# Patient Record
Sex: Male | Born: 1965 | Race: Black or African American | Hispanic: No | Marital: Married | Smoking: Former smoker
Health system: Southern US, Community
[De-identification: ages and names within clinical notes are randomized; demographics above are authoritative.]

## PROBLEM LIST (undated history)

## (undated) DIAGNOSIS — K219 Gastro-esophageal reflux disease without esophagitis: Secondary | ICD-10-CM

## (undated) DIAGNOSIS — G5603 Carpal tunnel syndrome, bilateral upper limbs: Secondary | ICD-10-CM

## (undated) DIAGNOSIS — N200 Calculus of kidney: Secondary | ICD-10-CM

## (undated) DIAGNOSIS — M109 Gout, unspecified: Secondary | ICD-10-CM

## (undated) DIAGNOSIS — E559 Vitamin D deficiency, unspecified: Secondary | ICD-10-CM

## (undated) DIAGNOSIS — E119 Type 2 diabetes mellitus without complications: Secondary | ICD-10-CM

## (undated) DIAGNOSIS — E785 Hyperlipidemia, unspecified: Secondary | ICD-10-CM

## (undated) DIAGNOSIS — K760 Fatty (change of) liver, not elsewhere classified: Secondary | ICD-10-CM

## (undated) DIAGNOSIS — Z794 Long term (current) use of insulin: Secondary | ICD-10-CM

## (undated) DIAGNOSIS — I10 Essential (primary) hypertension: Secondary | ICD-10-CM

## (undated) HISTORY — DX: Fatty (change of) liver, not elsewhere classified: K76.0

## (undated) HISTORY — DX: Gastro-esophageal reflux disease without esophagitis: K21.9

## (undated) HISTORY — DX: Gout, unspecified: M10.9

## (undated) HISTORY — DX: Type 2 diabetes mellitus without complications: E11.9

## (undated) HISTORY — DX: Hyperlipidemia, unspecified: E78.5

## (undated) HISTORY — DX: Type 2 diabetes mellitus without complications: Z79.4

## (undated) HISTORY — DX: Vitamin D deficiency, unspecified: E55.9

## (undated) HISTORY — DX: Essential (primary) hypertension: I10

---

## 2008-07-05 ENCOUNTER — Encounter (INDEPENDENT_AMBULATORY_CARE_PROVIDER_SITE_OTHER): Payer: Self-pay | Admitting: Internal Medicine

## 2008-07-05 ENCOUNTER — Ambulatory Visit: Payer: Self-pay

## 2008-12-09 ENCOUNTER — Ambulatory Visit (HOSPITAL_COMMUNITY): Admission: RE | Admit: 2008-12-09 | Discharge: 2008-12-09 | Payer: Self-pay | Admitting: Internal Medicine

## 2010-10-23 ENCOUNTER — Ambulatory Visit (HOSPITAL_COMMUNITY)
Admission: RE | Admit: 2010-10-23 | Discharge: 2010-10-23 | Payer: Self-pay | Source: Home / Self Care | Attending: Internal Medicine | Admitting: Internal Medicine

## 2011-05-21 ENCOUNTER — Other Ambulatory Visit (HOSPITAL_COMMUNITY): Payer: Self-pay | Admitting: Internal Medicine

## 2011-05-21 DIAGNOSIS — N281 Cyst of kidney, acquired: Secondary | ICD-10-CM

## 2011-05-25 ENCOUNTER — Ambulatory Visit (HOSPITAL_COMMUNITY)
Admission: RE | Admit: 2011-05-25 | Discharge: 2011-05-25 | Disposition: A | Discharge status: Home / Self Care | Payer: 59 | Source: Ambulatory Visit | Attending: Internal Medicine | Admitting: Internal Medicine

## 2011-05-25 DIAGNOSIS — Q619 Cystic kidney disease, unspecified: Secondary | ICD-10-CM | POA: Insufficient documentation

## 2011-05-25 DIAGNOSIS — N200 Calculus of kidney: Secondary | ICD-10-CM | POA: Insufficient documentation

## 2011-05-25 DIAGNOSIS — N281 Cyst of kidney, acquired: Secondary | ICD-10-CM

## 2011-11-27 ENCOUNTER — Ambulatory Visit
Admission: RE | Admit: 2011-11-27 | Discharge: 2011-11-27 | Disposition: A | Discharge status: Home / Self Care | Payer: 59 | Source: Ambulatory Visit | Attending: Internal Medicine | Admitting: Internal Medicine

## 2011-11-27 ENCOUNTER — Other Ambulatory Visit: Payer: Self-pay | Admitting: Internal Medicine

## 2011-11-27 DIAGNOSIS — R1031 Right lower quadrant pain: Secondary | ICD-10-CM

## 2011-11-27 MED ORDER — IOHEXOL 300 MG/ML  SOLN
20.0000 mL | Freq: Once | INTRAMUSCULAR | Status: AC | PRN
  Administered 2011-11-27: 20 mL via ORAL

## 2011-11-27 MED ORDER — IOHEXOL 300 MG/ML  SOLN
125.0000 mL | Freq: Once | INTRAMUSCULAR | Status: AC | PRN
  Administered 2011-11-27: 125 mL via INTRAVENOUS

## 2011-11-28 ENCOUNTER — Other Ambulatory Visit: Payer: Self-pay | Admitting: Internal Medicine

## 2011-11-28 DIAGNOSIS — IMO0002 Reserved for concepts with insufficient information to code with codable children: Secondary | ICD-10-CM

## 2011-12-05 ENCOUNTER — Ambulatory Visit
Admission: RE | Admit: 2011-12-05 | Discharge: 2011-12-05 | Disposition: A | Discharge status: Home / Self Care | Payer: 59 | Source: Ambulatory Visit | Attending: Internal Medicine | Admitting: Internal Medicine

## 2011-12-05 DIAGNOSIS — IMO0002 Reserved for concepts with insufficient information to code with codable children: Secondary | ICD-10-CM

## 2011-12-05 MED ORDER — GADOBENATE DIMEGLUMINE 529 MG/ML IV SOLN
20.0000 mL | Freq: Once | INTRAVENOUS | Status: AC | PRN
  Administered 2011-12-05: 20 mL via INTRAVENOUS

## 2013-10-15 ENCOUNTER — Other Ambulatory Visit: Payer: Self-pay | Admitting: Internal Medicine

## 2013-10-22 ENCOUNTER — Encounter: Payer: Self-pay | Admitting: Physician Assistant

## 2013-10-22 DIAGNOSIS — E119 Type 2 diabetes mellitus without complications: Secondary | ICD-10-CM

## 2013-10-22 DIAGNOSIS — I10 Essential (primary) hypertension: Secondary | ICD-10-CM | POA: Insufficient documentation

## 2013-10-22 DIAGNOSIS — E559 Vitamin D deficiency, unspecified: Secondary | ICD-10-CM | POA: Insufficient documentation

## 2013-10-22 DIAGNOSIS — E1169 Type 2 diabetes mellitus with other specified complication: Secondary | ICD-10-CM | POA: Insufficient documentation

## 2013-10-22 DIAGNOSIS — E785 Hyperlipidemia, unspecified: Secondary | ICD-10-CM

## 2013-10-26 ENCOUNTER — Encounter: Payer: Self-pay | Admitting: Physician Assistant

## 2013-10-26 ENCOUNTER — Ambulatory Visit (INDEPENDENT_AMBULATORY_CARE_PROVIDER_SITE_OTHER): Payer: PRIVATE HEALTH INSURANCE | Admitting: Physician Assistant

## 2013-10-26 VITALS — BP 148/88 | HR 72 | Temp 98.7°F | Resp 16 | Ht 72.0 in | Wt 262.0 lb

## 2013-10-26 DIAGNOSIS — E785 Hyperlipidemia, unspecified: Secondary | ICD-10-CM

## 2013-10-26 DIAGNOSIS — E119 Type 2 diabetes mellitus without complications: Secondary | ICD-10-CM

## 2013-10-26 DIAGNOSIS — E782 Mixed hyperlipidemia: Secondary | ICD-10-CM

## 2013-10-26 DIAGNOSIS — E559 Vitamin D deficiency, unspecified: Secondary | ICD-10-CM

## 2013-10-26 DIAGNOSIS — I10 Essential (primary) hypertension: Secondary | ICD-10-CM

## 2013-10-26 LAB — HEPATIC FUNCTION PANEL
AST: 16 U/L (ref 0–37)
Total Bilirubin: 0.3 mg/dL (ref 0.3–1.2)

## 2013-10-26 LAB — CBC WITH DIFFERENTIAL/PLATELET
Eosinophils Relative: 3 % (ref 0–5)
HCT: 43.6 % (ref 39.0–52.0)
Hemoglobin: 14.7 g/dL (ref 13.0–17.0)
Lymphocytes Relative: 38 % (ref 12–46)
Lymphs Abs: 2.6 10*3/uL (ref 0.7–4.0)
Monocytes Absolute: 0.5 10*3/uL (ref 0.1–1.0)
Monocytes Relative: 8 % (ref 3–12)
Neutro Abs: 3.3 10*3/uL (ref 1.7–7.7)
RBC: 4.99 MIL/uL (ref 4.22–5.81)
RDW: 13.7 % (ref 11.5–15.5)

## 2013-10-26 LAB — LIPID PANEL
LDL Cholesterol: 116 mg/dL — ABNORMAL HIGH (ref 0–99)
Triglycerides: 93 mg/dL (ref <150)
VLDL: 19 mg/dL (ref 0–40)

## 2013-10-26 LAB — BASIC METABOLIC PANEL WITH GFR
CO2: 25 mEq/L (ref 19–32)
GFR, Est Non African American: >89 mL/min
Glucose, Bld: 224 mg/dL — ABNORMAL HIGH (ref 70–99)
Sodium: 136 mEq/L (ref 135–145)

## 2013-10-26 LAB — TSH: TSH: 1.771 u[IU]/mL (ref 0.350–4.500)

## 2013-10-26 MED ORDER — DAPAGLIFLOZIN PROPANEDIOL 10 MG PO TABS: ORAL_TABLET | ORAL | Status: DC

## 2013-10-26 MED ORDER — BENAZEPRIL HCL 20 MG PO TABS: ORAL_TABLET | ORAL | Status: DC

## 2013-10-26 MED ORDER — BISOPROLOL-HYDROCHLOROTHIAZIDE 5-6.25 MG PO TABS: ORAL_TABLET | ORAL | Status: DC

## 2013-10-26 NOTE — Patient Instructions (Signed)
 Bad carbs also include fruit juice, alcohol, and sweet tea. These are empty calories that do not signal to your brain that you are full.   Please remember the good carbs are still carbs which convert into sugar. So please measure them out no more than 1/2-1 cup of rice, oatmeal, pasta, and beans.  Veggies are however free foods! Pile them on.   I like lean protein at every meal such as chicken, turkey, pork chops, cottage cheese, etc. Just do not fry these meats and please center your meal around vegetable, the meats should be a side dish.   No all fruit is created equal. Please see the list below, the fruit at the bottom is higher in sugars than the fruit at the top   Sleep Apnea  Sleep apnea is a sleep disorder characterized by abnormal pauses in breathing while you sleep. When your breathing pauses, the level of oxygen in your blood decreases. This causes you to move out of deep sleep and into light sleep. As a result, your quality of sleep is poor, and the system that carries your blood throughout your body (cardiovascular system) experiences stress. If sleep apnea remains untreated, the following conditions can develop:  High blood pressure (hypertension).  Coronary artery disease.  Inability to achieve or maintain an erection (impotence).  Impairment of your thought process (cognitive dysfunction). There are three types of sleep apnea: 1. Obstructive sleep apnea Pauses in breathing during sleep because of a blocked airway. 2. Central sleep apnea Pauses in breathing during sleep because the area of the brain that controls your breathing does not send the correct signals to the muscles that control breathing. 3. Mixed sleep apnea A combination of both obstructive and central sleep apnea. RISK FACTORS The following risk factors can increase your risk of developing sleep apnea:  Being overweight.  Smoking.  Having narrow passages in your nose and throat.  Being of older  age.  Being male.  Alcohol use.  Sedative and tranquilizer use.  Ethnicity. Among individuals younger than 35 years, African Americans are at increased risk of sleep apnea. SYMPTOMS   Difficulty staying asleep.  Daytime sleepiness and fatigue.  Loss of energy.  Irritability.  Loud, heavy snoring.  Morning headaches.  Trouble concentrating.  Forgetfulness.  Decreased interest in sex. DIAGNOSIS  In order to diagnose sleep apnea, your caregiver will perform a physical examination. Your caregiver may suggest that you take a home sleep test. Your caregiver may also recommend that you spend the night in a sleep lab. In the sleep lab, several monitors record information about your heart, lungs, and brain while you sleep. Your leg and arm movements and blood oxygen level are also recorded. TREATMENT The following actions may help to resolve mild sleep apnea:  Sleeping on your side.   Using a decongestant if you have nasal congestion.   Avoiding the use of depressants, including alcohol, sedatives, and narcotics.   Losing weight and modifying your diet if you are overweight. There also are devices and treatments to help open your airway:  Oral appliances. These are custom-made mouthpieces that shift your lower jaw forward and slightly open your bite. This opens your airway.  Devices that create positive airway pressure. This positive pressure "splints" your airway open to help you breathe better during sleep. The following devices create positive airway pressure:  Continuous positive airway pressure (CPAP) device. The CPAP device creates a continuous level of air pressure with an air pump. The air is   delivered to your airway through a mask while you sleep. This continuous pressure keeps your airway open.  Nasal expiratory positive airway pressure (EPAP) device. The EPAP device creates positive air pressure as you exhale. The device consists of single-use valves, which are  inserted into each nostril and held in place by adhesive. The valves create very little resistance when you inhale but create much more resistance when you exhale. That increased resistance creates the positive airway pressure. This positive pressure while you exhale keeps your airway open, making it easier to breath when you inhale again.  Bilevel positive airway pressure (BPAP) device. The BPAP device is used mainly in patients with central sleep apnea. This device is similar to the CPAP device because it also uses an air pump to deliver continuous air pressure through a mask. However, with the BPAP machine, the pressure is set at two different levels. The pressure when you exhale is lower than the pressure when you inhale.  Surgery. Typically, surgery is only done if you cannot comply with less invasive treatments or if the less invasive treatments do not improve your condition. Surgery involves removing excess tissue in your airway to create a wider passage way. Document Released: 10/19/2002 Document Revised: 02/23/2013 Document Reviewed: 03/06/2012 [employer] Patient Information 2014 [employer].  

## 2013-10-26 NOTE — Progress Notes (Signed)
HPI Patient presents for 3 month follow up with hypertension, hyperlipidemia, diabetes and vitamin D. Patient's blood pressure has been controlled at home, today their BP is elevated but he has run out of his BP meds for 2 weeks.  Patient denies chest pain, shortness of breath, dizziness.  Patient's cholesterol is diet controlled. The cholesterol last visit was LDL 110.  The patient has not been working on diet for Diabetes, but denies changes in vision, polys, and paresthesias. Patient states that he forgets his insulin and metformin at night often. Also he is not on Novolin, he has been getting levemir from his girlfriend from her work.  A1C was 8.7. Has started new job at [employer], works 11 hour days and has not been eating well because of this.  Patient is on Vitamin D supplement.   Current Medications:  Current Outpatient Prescriptions on File Prior to Visit  Medication Sig Dispense Refill  . allopurinol (ZYLOPRIM) 300 MG tablet Take 300 mg by mouth daily.      Marland Kitchen aspirin 81 MG tablet Take 81 mg by mouth daily.      . benazepril (LOTENSIN) 20 MG tablet TAKE ONE TABLET BY MOUTH AT BEDTIME FOR BLOOD PRESSURE AND KINNEY.  90 tablet  1  . bisoprolol-hydrochlorothiazide (ZIAC) 5-6.25 MG per tablet TAKE ONE TABLET BY MOUTH AT BEDTIME  90 tablet  1  . cholecalciferol (VITAMIN D) 1000 UNITS tablet Take 1,000 Units by mouth daily.      . insulin NPH (HUMULIN N,NOVOLIN N) 100 UNIT/ML injection Inject 15 Units into the skin 2 (two) times daily at 8 am and 10 pm.      . metFORMIN (GLUCOPHAGE) 1000 MG tablet Take 1,000 mg by mouth 2 (two) times daily with a meal.       No current facility-administered medications on file prior to visit.   Medical History:  Past Medical History  Diagnosis Date  . Hypertension   . Hyperlipidemia   . Diabetes mellitus type 2, insulin dependent   . Fatty liver disease, nonalcoholic   . Gout   . Vitamin D deficiency    Allergies:  Allergies  Allergen  Reactions  . 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 Welchol [Colesevelam Hcl]     Chest pain    ROS Constitutional: Denies fever, chills, headaches, insomnia, fatigue, night sweats Eyes: Denies redness, blurred vision, diplopia, discharge, itchy, watery eyes.  ENT: + snoring Denies congestion, post nasal drip, sore throat, earache, dental pain, Tinnitus, Vertigo, Sinus pain Cardio: Denies chest pain, palpitations, irregular heartbeat, dyspnea, diaphoresis, orthopnea, PND, claudication, edema Respiratory: denies cough, shortness of breath, wheezing.  Gastrointestinal: Denies dysphagia, heartburn, AB pain/ cramps, N/V, diarrhea, constipation, hematemesis, melena, hematochezia,  hemorrhoids Genitourinary: Denies dysuria, frequency, urgency, nocturia, hesitancy, discharge, hematuria, flank pain Musculoskeletal: Denies myalgia, stiffness, pain, swelling and strain/sprain. Skin: Denies pruritis, rash, changing in skin lesion Neuro: Denies Weakness, tremor, incoordination, spasms, pain Psychiatric: Denies confusion, memory loss, sensory loss Endocrine: Denies change in weight, skin, hair change, nocturia Diabetic Polys, Denies visual blurring, hyper /hypo glycemic episodes, and paresthesia, Heme/Lymph: Denies Excessive bleeding, bruising, enlarged lymph nodes  Family history- Review and unchanged Social history- Review and unchanged Physical Exam: Filed Vitals:   10/26/13 0914  BP: 148/88  Pulse: 72  Temp: 98.7 F (37.1 C)  Resp: 16   Filed Weights   10/26/13 0914  Weight: 262 lb (118.842 kg)   General Appearance: Well nourished, in no apparent  distress. Eyes: PERRLA, EOMs, conjunctiva no swelling or erythema Sinuses: No Frontal/maxillary tenderness ENT/Mouth: Crowded mouth structures Ext aud canals clear, TMs without erythema, bulging. No erythema, swelling, or exudate on post pharynx.  Tonsils not swollen or erythematous. Hearing normal.  Neck: Supple,  thyroid normal.  Respiratory: Respiratory effort normal, BS equal bilaterally without rales, rhonchi, wheezing or stridor.  Cardio: RRR with no MRGs. Brisk peripheral pulses without edema.  Abdomen: Obese, Soft, + BS.  Non tender, no guarding, rebound, hernias, masses. Lymphatics: Non tender without lymphadenopathy.  Musculoskeletal: Full ROM, 5/5 strength, normal gait.  Skin: Warm, dry without rashes, lesions, ecchymosis.  Neuro: Cranial nerves intact. No cerebellar symptoms. Sensation intact.  Psych: Awake and oriented X 3, normal affect, Insight and Judgment appropriate.   Assessment and Plan:  Hypertension: Continue medication, monitor blood pressure at home.  Continue DASH diet.Will refill meds. Cholesterol: Continue diet and exercise. Check cholesterol.  Diabetes-Continue diet and exercise. Check A1C. Do Levemir 15 units, given farxiga+metformin samples- follow up in one month.  Vitamin D Def- check level and continue medications.  Noncompliance- discussed at length risk of noncompliance with DM, HTN and chol.  Continue diet and meds as discussed. Further disposition pending results of labs. Discussed med's effects and SE's.    Quentin Mulling 9:35 AM

## 2013-10-27 LAB — VITAMIN D 25 HYDROXY (VIT D DEFICIENCY, FRACTURES): Vit D, 25-Hydroxy: 52 ng/mL (ref 30–89)

## 2013-11-30 ENCOUNTER — Ambulatory Visit: Payer: Self-pay | Admitting: Physician Assistant

## 2013-12-21 ENCOUNTER — Ambulatory Visit (INDEPENDENT_AMBULATORY_CARE_PROVIDER_SITE_OTHER): Payer: 59 | Admitting: Physician Assistant

## 2013-12-21 ENCOUNTER — Encounter: Payer: Self-pay | Admitting: Physician Assistant

## 2013-12-21 VITALS — BP 128/78 | HR 56 | Temp 97.5°F | Resp 16 | Ht 74.0 in | Wt 260.0 lb

## 2013-12-21 DIAGNOSIS — M109 Gout, unspecified: Secondary | ICD-10-CM

## 2013-12-21 DIAGNOSIS — Z794 Long term (current) use of insulin: Principal | ICD-10-CM

## 2013-12-21 DIAGNOSIS — R945 Abnormal results of liver function studies: Secondary | ICD-10-CM

## 2013-12-21 DIAGNOSIS — E119 Type 2 diabetes mellitus without complications: Secondary | ICD-10-CM

## 2013-12-21 DIAGNOSIS — E291 Testicular hypofunction: Secondary | ICD-10-CM

## 2013-12-21 DIAGNOSIS — R7989 Other specified abnormal findings of blood chemistry: Secondary | ICD-10-CM

## 2013-12-21 LAB — HEPATIC FUNCTION PANEL
ALBUMIN: 4.1 g/dL (ref 3.5–5.2)
ALT: 27 U/L (ref 0–53)
AST: 18 U/L (ref 0–37)
Alkaline Phosphatase: 103 U/L (ref 39–117)
BILIRUBIN TOTAL: 0.4 mg/dL (ref 0.2–1.2)
Bilirubin, Direct: 0.1 mg/dL (ref 0.0–0.3)
Indirect Bilirubin: 0.3 mg/dL (ref 0.2–1.2)
TOTAL PROTEIN: 7.4 g/dL (ref 6.0–8.3)

## 2013-12-21 LAB — FRUCTOSAMINE: FRUCTOSAMINE: 307 umol/L — AB (ref <285)

## 2013-12-21 LAB — CBC WITH DIFFERENTIAL/PLATELET
BASOS ABS: 0.1 10*3/uL (ref 0.0–0.1)
Basophils Relative: 1 % (ref 0–1)
Eosinophils Absolute: 0.3 10*3/uL (ref 0.0–0.7)
Eosinophils Relative: 3 % (ref 0–5)
HCT: 45 % (ref 39.0–52.0)
HEMOGLOBIN: 15.3 g/dL (ref 13.0–17.0)
LYMPHS ABS: 3.3 10*3/uL (ref 0.7–4.0)
LYMPHS PCT: 43 % (ref 12–46)
MCH: 30.3 pg (ref 26.0–34.0)
MCHC: 34 g/dL (ref 30.0–36.0)
MCV: 89.1 fL (ref 78.0–100.0)
Monocytes Absolute: 0.5 10*3/uL (ref 0.1–1.0)
Monocytes Relative: 7 % (ref 3–12)
NEUTROS ABS: 3.6 10*3/uL (ref 1.7–7.7)
Neutrophils Relative %: 46 % (ref 43–77)
PLATELETS: 300 10*3/uL (ref 150–400)
RBC: 5.05 MIL/uL (ref 4.22–5.81)
RDW: 13.8 % (ref 11.5–15.5)
WBC: 7.8 10*3/uL (ref 4.0–10.5)

## 2013-12-21 LAB — BASIC METABOLIC PANEL WITH GFR
BUN: 11 mg/dL (ref 6–23)
CHLORIDE: 99 meq/L (ref 96–112)
CO2: 29 meq/L (ref 19–32)
CREATININE: 0.68 mg/dL (ref 0.50–1.35)
Calcium: 9 mg/dL (ref 8.4–10.5)
GFR, Est Non African American: >89 mL/min
GLUCOSE: 181 mg/dL — AB (ref 70–99)
POTASSIUM: 4.2 meq/L (ref 3.5–5.3)
SODIUM: 136 meq/L (ref 135–145)

## 2013-12-21 LAB — TESTOSTERONE: Testosterone: 240 ng/dL — ABNORMAL LOW (ref 300–890)

## 2013-12-21 LAB — URIC ACID: URIC ACID, SERUM: 6.9 mg/dL (ref 4.0–7.8)

## 2013-12-21 MED ORDER — DAPAGLIFLOZIN PRO-METFORMIN ER 5-1000 MG PO TB24: 1.0000 | ORAL_TABLET | Freq: Two times a day (BID) | ORAL | Status: DC

## 2013-12-21 MED ORDER — PHENTERMINE HCL 37.5 MG PO TABS: 37.5000 mg | ORAL_TABLET | Freq: Every day | ORAL | Status: DC

## 2013-12-21 NOTE — Patient Instructions (Signed)
Phentermine  While taking the medication we will ask that you come into the office once a month to monitor your weight, blood pressure, and heart rate. In addition we can help answer your questions about diet, exercise, and help you every step of the way with your weight loss journey. Sometime it is helpful if you bring in a food diary or use an app on your phone such as myfitnesspal to record your calorie intake, especially in the beginning.   You can start out on 1/3 to 1/2 a pill in the morning and if you are tolerating it well you can increase to one pill daily.   What is this medicine? PHENTERMINE (FEN ter meen) decreases your appetite. This medicine is intended to be used in addition to a healthy reduced calorie diet and exercise. The best results are achieved this way. This medicine is only indicated for short-term use. Eventually your weight loss may level out and the medication will no longer be needed.   How should I use this medicine? Take this medicine by mouth. Follow the directions on the prescription label. The tablets should stay in the bottle until immediately before you take your dose. Take your doses at regular intervals. Do not take your medicine more often than directed.  Overdosage: If you think you have taken too much of this medicine contact a poison control center or emergency room at once. NOTE: This medicine is only for you. Do not share this medicine with others.  What if I miss a dose? If you miss a dose, take it as soon as you can. If it is almost time for your next dose, take only that dose. Do not take double or extra doses. Do not increase or in any way change your dose without consulting your doctor.  What should I watch for while using this medicine? Notify your physician immediately if you become short of breath while doing your normal activities. Do not take this medicine within 6 hours of bedtime. It can keep you from getting to sleep. Avoid drinks that contain  caffeine and try to stick to a regular bedtime every night. Do not stand or sit up quickly, especially if you are an older patient. This reduces the risk of dizzy or fainting spells. Avoid alcoholic drinks.  What side effects may I notice from receiving this medicine? Side effects that you should report to your doctor or health care professional as soon as possible: -chest pain, palpitations -depression or severe changes in mood -increased blood pressure -irritability -nervousness or restlessness -severe dizziness -shortness of breath -problems urinating -unusual swelling of the legs -vomiting  Side effects that usually do not require medical attention (report to your doctor or health care professional if they continue or are bothersome): -blurred vision or other eye problems -changes in sexual ability or desire -constipation or diarrhea -difficulty sleeping -dry mouth or unpleasant taste -headache -nausea This list may not describe all possible side effects. Call your doctor for medical advice about side effects. You may report side effects to FDA at 1-800-FDA-1088.    Bad carbs also include fruit juice, alcohol, and sweet tea. These are empty calories that do not signal to your brain that you are full.   Please remember the good carbs are still carbs which convert into sugar. So please measure them out no more than 1/2-1 cup of rice, oatmeal, pasta, and beans.  Veggies are however free foods! Pile them on.   I like lean protein at  every meal such as chicken, [place], pork chops, cottage cheese, etc. Just do not fry these meats and please center your meal around vegetable, the meats should be a side dish.   No all fruit is created equal. Please see the list below, the fruit at the bottom is higher in sugars than the fruit at the top

## 2013-12-21 NOTE — Progress Notes (Signed)
HPI Patient presents for a one month follow up. He was started on Farxiga last visit, has been out for 9 days but states he sugars on the medication has been 140-173. He did not have any AE's like dizziness, polyuria, or problems with the farxiga. He has lost 2 lbs since his last visit. He continues to drink 1-2 sodas daily. His alk phos was also elevated last visit. He also complains of his right finger being swollen for 2 weeks, it has improved some. No injury, does have a history of gout.   Lab Results  Component Value Date   HGBA1C 7.8* 10/26/2013   Lab Results  Component Value Date   ALT 24 10/26/2013   AST 16 10/26/2013   ALKPHOS 125* 10/26/2013   BILITOT 0.3 10/26/2013    Past Medical History  Diagnosis Date  . Hypertension   . Hyperlipidemia   . Diabetes mellitus type 2, insulin dependent   . Fatty liver disease, nonalcoholic   . Gout   . Vitamin D deficiency      Allergies  Allergen Reactions  . 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 Welchol [Colesevelam Hcl]     Chest pain      Current Outpatient Prescriptions on File Prior to Visit  Medication Sig Dispense Refill  . allopurinol (ZYLOPRIM) 300 MG tablet Take 300 mg by mouth daily.      Marland Kitchen aspirin 81 MG tablet Take 81 mg by mouth daily.      . benazepril (LOTENSIN) 20 MG tablet TAKE ONE TABLET BY MOUTH AT BEDTIME FOR BLOOD PRESSURE AND KINNEY.  90 tablet  0  . bisoprolol-hydrochlorothiazide (ZIAC) 5-6.25 MG per tablet TAKE ONE TABLET BY MOUTH AT BEDTIME  90 tablet  0  . cholecalciferol (VITAMIN D) 1000 UNITS tablet Take 1,000 Units by mouth daily.      . Dapagliflozin Propanediol (FARXIGA) 10 MG TABS 1 pill daily  30 tablet  0  . insulin detemir (LEVEMIR) 100 UNIT/ML injection Inject into the skin at bedtime.      . metFORMIN (GLUCOPHAGE) 1000 MG tablet Take 1,000 mg by mouth 2 (two) times daily with a meal.       No current facility-administered medications on file prior to visit.     ROS: all negative expect above.   Physical: Filed Weights   12/21/13 1132  Weight: 260 lb (117.935 kg)   Filed Vitals:   12/21/13 1132  BP: 128/78  Pulse: 56  Temp: 97.5 F (36.4 C)  Resp: 16   General Appearance: Well nourished, in no apparent distress. Eyes: PERRLA, EOMs. Sinuses: No Frontal/maxillary tenderness ENT/Mouth: Ext aud canals clear, normal light reflex with TMs without erythema, bulging. Post pharynx without erythema, swelling, exudate.  Respiratory: CTAB Cardio: RRR, no murmurs, rubs or gallops. Peripheral pulses brisk and equal bilaterally, without edema. No aortic or femoral bruits. Abdomen: Soft, obese, with bowl sounds. Nontender, no guarding, rebound. Lymphatics: Non tender without lymphadenopathy.  Musculoskeletal: Full ROM all peripheral extremities, 5/5 strength, and normal gait. Right 2nd digiti PIP with mild swelling, decreased flexion, no warmth/redness.  Skin: Warm, dry without rashes, lesions, ecchymosis.  Neuro: Cranial nerves intact, reflexes equal bilaterally. Normal muscle tone, no cerebellar symptoms. Sensation intact.  Pysch: Awake and oriented X 3, normal affect, Insight and Judgment appropriate.   Assessment and Plan: DMII- will continue diet/exercise, no carb/sugar liquids- will combine the farxiga/metformin Elevated LFTs- likely from fatty liver- will get LFTS Obesity- phentermine  37.5 #30. Follow up in one month Fatigue/history of low T- check testosterone- willing to get on shot Right 2nd PIP pain-? If gout- will check uric acid, RICE- if continues we will get Xray/labs Hypertension- at goal.

## 2013-12-22 MED ORDER — "NEEDLE (DISP) 21G X 1"" MISC": 2.0000 mL | Status: DC

## 2013-12-22 MED ORDER — TESTOSTERONE CYPIONATE 200 MG/ML IM SOLN: INTRAMUSCULAR | Status: DC

## 2013-12-22 NOTE — Addendum Note (Signed)
Addended by: Vladimir Crofts on: 12/22/2013 08:33 AM   Modules accepted: Orders

## 2014-01-24 DIAGNOSIS — Z79899 Other long term (current) drug therapy: Secondary | ICD-10-CM | POA: Insufficient documentation

## 2014-01-24 NOTE — Progress Notes (Signed)
Patient ID: Arthur Hudson, male   DOB: September 14, 1966, 48 y.o.   MRN: [id number]    This very nice 48 y.o. SBM presents for 3 month follow up with Hypertension, Hyperlipidemia, Pre-Diabetes and Vitamin D Deficiency.    HTN predates since Nov 2007. BP is not monitored at home Today's BP: 124/80 mmHg . Patient denies any cardiac type chest pain, palpitations, dyspnea/orthopnea/PND, dizziness, claudication, or dependent edema.   Hyperlipidemia is not controlled with diet & meds. Last Lipid as below shows LDL above goal Patient denies myalgias or other med SE's.  Lab Results  Component Value Date   CHOL 182 10/26/2013   HDL 47 10/26/2013   LDLCALC 116* 10/26/2013   TRIG 93 10/26/2013   CHOLHDL 3.9 10/26/2013    Also, the patient has history of T1 IDDM since  Nov 2007  And  last A1c of 7.8% in Dec 2014. He reports CBG's range 160-180 mg% and he freely andits his poor dietary compliance. Patient denies any symptoms of reactive hypoglycemia, diabetic polys, paresthesias or visual blurring.   Further, Patient has history of Vitamin D Deficiency of 23 in 2008 with last vitamin D of 52 in Aug 2013. Patient supplements vitamin D without any suspected side-effects.  Medication Sig  . allopurinol (ZYLOPRIM) 300 MG tablet Take 300 mg by mouth daily.  Marland Kitchen aspirin 81 MG tablet Take 81 mg by mouth daily.  . benazepril (LOTENSIN) 20 MG tablet TAKE ONE TABLET BY MOUTH AT BEDTIME FOR BLOOD PRESSURE AND KINNEY.  Marland Kitchen bisoprolol-hydrochlorothiazide (ZIAC) 5-6.25 MG per tablet TAKE ONE TABLET BY MOUTH AT BEDTIME  . cholecalciferol (VITAMIN D) 1000 UNITS tablet Take 1,000 Units by mouth daily.  . Dapagliflozin-Metformin HCl ER (XIGDUO XR) 03-999 MG TB24 Take 1 tablet by mouth 2 (two) times daily.  . insulin detemir (LEVEMIR) 100 UNIT/ML injection Inject into the skin at bedtime.  Marland Kitchen NEEDLE, DISP, 21 G 21G X 1" MISC Inject 2 mLs as directed every 14 (fourteen) days.  . phentermine (ADIPEX-P) 37.5 MG tablet Take 1 tablet  (37.5 mg total) by mouth daily before breakfast.  . testosterone cypionate (DEPO-TESTOSTERONE) 200 MG/ML injection 2 cc intermuscular every 2 weeks OR as directed by your doctor.     Allergies  Allergen Reactions  . 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 Welchol [Colesevelam Hcl]     Chest pain    PMHx:   Past Medical History  Diagnosis Date  . Hypertension   . Hyperlipidemia   . Diabetes mellitus type 1, insulin dependent   . Fatty liver disease, nonalcoholic   . Gout   . Vitamin D deficiency    FHx:    Reviewed / unchanged  SHx:    Reviewed / unchanged   Systems Review: Constitutional: Denies fever, chills, wt changes, headaches, insomnia, fatigue, night sweats, change in appetite. Eyes: Denies redness, blurred vision, diplopia, discharge, itchy, watery eyes.  ENT: Denies discharge, congestion, post nasal drip, epistaxis, sore throat, earache, hearing loss, dental pain, tinnitus, vertigo, sinus pain, snoring.  CV: Denies chest pain, palpitations, irregular heartbeat, syncope, dyspnea, diaphoresis, orthopnea, PND, claudication, edema. Respiratory: denies cough, dyspnea, DOE, pleurisy, hoarseness, laryngitis, wheezing.  Gastrointestinal: Denies dysphagia, odynophagia, heartburn, reflux, water brash, abdominal pain or cramps, nausea, vomiting, bloating, diarrhea, constipation, hematemesis, melena, hematochezia,  or hemorrhoids. Genitourinary: Denies dysuria, frequency, urgency, nocturia, hesitancy, discharge, hematuria, flank pain. Musculoskeletal: Denies arthralgias, myalgias, stiffness, jt. swelling, pain, limp, strain/sprain.  Skin: Denies pruritus, rash, hives, warts,  acne, eczema, change in skin lesion(s). Neuro: No weakness, tremor, incoordination, spasms, paresthesia, or pain. Psychiatric: Denies confusion, memory loss, or sensory loss. Endo: Denies change in weight, skin, hair change.  Heme/Lymph: No excessive bleeding, bruising, orenlarged lymph  nodes.   Exam:  BP 124/80  Pulse 64  Temp(Src) 96.8 F (36 C) (Temporal)  Resp 16  Ht 6' 1.75" (1.873 m)  Wt 248 lb 6.4 oz (112.674 kg)  BMI 32.12 kg/m2  Appears well nourished - in no distress. Eyes: PERRLA, EOMs, conjunctiva no swelling or erythema. Sinuses: No frontal/maxillary tenderness ENT/Mouth: EAC's clear, TM's nl w/o erythema, bulging. Nares clear w/o erythema, swelling, exudates. Oropharynx clear without erythema or exudates. Oral hygiene is good. Tongue normal, non obstructing. Hearing intact.  Neck: Supple. Thyroid nl. Car 2+/2+ without bruits, nodes or JVD. Chest: Respirations nl with BS clear & equal w/o rales, rhonchi, wheezing or stridor.  Cor: Heart sounds normal w/ regular rate and rhythm without sig. murmurs, gallops, clicks, or rubs. Peripheral pulses normal and equal  without edema.  Abdomen: Soft & bowel sounds normal. Non-tender w/o guarding, rebound, hernias, masses, or organomegaly.  Lymphatics: Unremarkable.  Musculoskeletal: Full ROM all peripheral extremities, joint stability, 5/5 strength, and normal gait.  Skin: Warm, dry without exposed rashes, lesions, ecchymosis apparent.  Neuro: Cranial nerves intact, reflexes equal bilaterally. Sensory-motor testing grossly intact. Tendon reflexes grossly intact.  Pysch: Alert & oriented x 3. Insight and judgement nl & appropriate. No ideations.  Assessment and Plan:  1. Hypertension - Continue monitor blood pressure at home. Continue diet/meds same.  2. Hyperlipidemia - Continue diet/meds, exercise,& lifestyle modifications. Continue monitor periodic   3. T1 IDDM - continue recommend prudent low glycemic diet, weight control, regular exercise, diabetic monitoring and periodic eye exams.  4. Vitamin D Deficiency - Continue supplementation.  5. Gout  Recommended regular exercise, BP monitoring, weight control, and discussed med and SE's. Recommended labs to assess and monitor clinical status. Further  disposition pending results of labs.

## 2014-01-24 NOTE — Patient Instructions (Signed)
 Hypertension As your heart beats, it forces blood through your arteries. This force is your blood pressure. If the pressure is too high, it is called hypertension (HTN) or high blood pressure. HTN is dangerous because you may have it and not know it. High blood pressure may mean that your heart has to work harder to pump blood. Your arteries may be narrow or stiff. The extra work puts you at risk for heart disease, stroke, and other problems.  Blood pressure consists of two numbers, a higher number over a lower, 110/72, for example. It is stated as "110 over 72." The ideal is below 120 for the top number (systolic) and under 80 for the bottom (diastolic). Write down your blood pressure today. You should pay close attention to your blood pressure if you have certain conditions such as:  Heart failure.  Prior heart attack.  Diabetes  Chronic kidney disease.  Prior stroke.  Multiple risk factors for heart disease. To see if you have HTN, your blood pressure should be measured while you are seated with your arm held at the level of the heart. It should be measured at least twice. A one-time elevated blood pressure reading (especially in the Emergency Department) does not mean that you need treatment. There may be conditions in which the blood pressure is different between your right and left arms. It is important to see your caregiver soon for a recheck. Most people have essential hypertension which means that there is not a specific cause. This type of high blood pressure may be lowered by changing lifestyle factors such as:  Stress.  Smoking.  Lack of exercise.  Excessive weight.  Drug/tobacco/alcohol use.  Eating less salt. Most people do not have symptoms from high blood pressure until it has caused damage to the body. Effective treatment can often prevent, delay or reduce that damage. TREATMENT  When a cause has been identified, treatment for high blood pressure is directed at  the cause. There are a large number of medications to treat HTN. These fall into several categories, and your caregiver will help you select the medicines that are best for you. Medications may have side effects. You should review side effects with your caregiver. If your blood pressure stays high after you have made lifestyle changes or started on medicines,   Your medication(s) may need to be changed.  Other problems may need to be addressed.  Be certain you understand your prescriptions, and know how and when to take your medicine.  Be sure to follow up with your caregiver within the time frame advised (usually within two weeks) to have your blood pressure rechecked and to review your medications.  If you are taking more than one medicine to lower your blood pressure, make sure you know how and at what times they should be taken. Taking two medicines at the same time can result in blood pressure that is too low. SEEK IMMEDIATE MEDICAL CARE IF:  You develop a severe headache, blurred or changing vision, or confusion.  You have unusual weakness or numbness, or a faint feeling.  You have severe chest or abdominal pain, vomiting, or breathing problems. MAKE SURE YOU:   Understand these instructions.  Will watch your condition.  Will get help right away if you are not doing well or get worse.   Diabetes and Exercise Exercising regularly is important. It is not just about losing weight. It has many health benefits, such as:  Improving your overall fitness, flexibility, and   endurance.  Increasing your bone density.  Helping with weight control.  Decreasing your body fat.  Increasing your muscle strength.  Reducing stress and tension.  Improving your overall health. People with diabetes who exercise gain additional benefits because exercise:  Reduces appetite.  Improves the body's use of blood sugar (glucose).  Helps lower or control blood glucose.  Decreases blood  pressure.  Helps control blood lipids (such as cholesterol and triglycerides).  Improves the body's use of the hormone insulin by:  Increasing the body's insulin sensitivity.  Reducing the body's insulin needs.  Decreases the risk for heart disease because exercising:  Lowers cholesterol and triglycerides levels.  Increases the levels of good cholesterol (such as high-density lipoproteins [HDL]) in the body.  Lowers blood glucose levels. YOUR ACTIVITY PLAN  Choose an activity that you enjoy and set realistic goals. Your health care provider or diabetes educator can help you make an activity plan that works for you. You can break activities into 2 or 3 sessions throughout the day. Doing so is as good as one long session. Exercise ideas include:  Taking the dog for a walk.  Taking the stairs instead of the elevator.  Dancing to your favorite song.  Doing your favorite exercise with a friend. RECOMMENDATIONS FOR EXERCISING WITH TYPE 1 OR TYPE 2 DIABETES   Check your blood glucose before exercising. If blood glucose levels are greater than 240 mg/dL, check for urine ketones. Do not exercise if ketones are present.  Avoid injecting insulin into areas of the body that are going to be exercised. For example, avoid injecting insulin into:  The arms when playing tennis.  The legs when jogging.  Keep a record of:  Food intake before and after you exercise.  Expected peak times of insulin action.  Blood glucose levels before and after you exercise.  The type and amount of exercise you have done.  Review your records with your health care provider. Your health care provider will help you to develop guidelines for adjusting food intake and insulin amounts before and after exercising.  If you take insulin or oral hypoglycemic agents, watch for signs and symptoms of hypoglycemia. They include:  Dizziness.  Shaking.  Sweating.  Chills.  Confusion.  Drink plenty of water  while you exercise to prevent dehydration or heat stroke. Body water is lost during exercise and must be replaced.  Talk to your health care provider before starting an exercise program to make sure it is safe for you. Remember, almost any type of activity is better than none.    Cholesterol Cholesterol is a white, waxy, fat-like protein needed by your body in small amounts. The liver makes all the cholesterol you need. It is carried from the liver by the blood through the blood vessels. Deposits (plaque) may build up on blood vessel walls. This makes the arteries narrower and stiffer. Plaque increases the risk for heart attack and stroke. You cannot feel your cholesterol level even if it is very high. The only way to know is by a blood test to check your lipid (fats) levels. Once you know your cholesterol levels, you should keep a record of the test results. Work with your caregiver to to keep your levels in the desired range. WHAT THE RESULTS MEAN:  Total cholesterol is a rough measure of all the cholesterol in your blood.  LDL is the so-called bad cholesterol. This is the type that deposits cholesterol in the walls of the arteries. You want this   level to be low.  HDL is the good cholesterol because it cleans the arteries and carries the LDL away. You want this level to be high.  Triglycerides are fat that the body can either burn for energy or store. High levels are closely linked to heart disease. DESIRED LEVELS:  Total cholesterol below 200.  LDL below 100 for people at risk, below 70 for very high risk.  HDL above 50 is good, above 60 is best.  Triglycerides below 150. HOW TO LOWER YOUR CHOLESTEROL:  Diet.  Choose fish or white meat chicken and turkey, roasted or baked. Limit fatty cuts of red meat, fried foods, and processed meats, such as sausage and lunch meat.  Eat lots of fresh fruits and vegetables. Choose whole grains, beans, pasta, potatoes and cereals.  Use only  small amounts of olive, corn or canola oils. Avoid butter, mayonnaise, shortening or palm kernel oils. Avoid foods with trans-fats.  Use skim/nonfat milk and low-fat/nonfat yogurt and cheeses. Avoid whole milk, cream, ice cream, egg yolks and cheeses. Healthy desserts include angel food cake, ginger snaps, animal crackers, hard candy, popsicles, and low-fat/nonfat frozen yogurt. Avoid pastries, cakes, pies and cookies.  Exercise.  A regular program helps decrease LDL and raises HDL.  Helps with weight control.  Do things that increase your activity level like gardening, walking, or taking the stairs.  Medication.  May be prescribed by your caregiver to help lowering cholesterol and the risk for heart disease.  You may need medicine even if your levels are normal if you have several risk factors. HOME CARE INSTRUCTIONS   Follow your diet and exercise programs as suggested by your caregiver.  Take medications as directed.  Have blood work done when your caregiver feels it is necessary. MAKE SURE YOU:   Understand these instructions.  Will watch your condition.  Will get help right away if you are not doing well or get worse.      Vitamin D Deficiency Vitamin D is an important vitamin that your body needs. Having too little of it in your body is called a deficiency. A very bad deficiency can make your bones soft and can cause a condition called rickets.  Vitamin D is important to your body for different reasons, such as:   It helps your body absorb 2 minerals called calcium and phosphorus.  It helps make your bones healthy.  It may prevent some diseases, such as diabetes and multiple sclerosis.  It helps your muscles and heart. You can get vitamin D in several ways. It is a natural part of some foods. The vitamin is also added to some dairy products and cereals. Some people take vitamin D supplements. Also, your body makes vitamin D when you are in the sun. It changes the  sun's rays into a form of the vitamin that your body can use. CAUSES   Not eating enough foods that contain vitamin D.  Not getting enough sunlight.  Having certain digestive system diseases that make it hard to absorb vitamin D. These diseases include Crohn's disease, chronic pancreatitis, and cystic fibrosis.  Having a surgery in which part of the stomach or small intestine is removed.  Being obese. Fat cells pull vitamin D out of your blood. That means that obese people may not have enough vitamin D left in their blood and in other body tissues.  Having chronic kidney or liver disease. RISK FACTORS Risk factors are things that make you more likely to develop a vitamin   D deficiency. They include:  Being older.  Not being able to get outside very much.  Living in a nursing home.  Having had broken bones.  Having weak or thin bones (osteoporosis).  Having a disease or condition that changes how your body absorbs vitamin D.  Having dark skin.  Some medicines such as seizure medicines or steroids.  Being overweight or obese. SYMPTOMS Mild cases of vitamin D deficiency may not have any symptoms. If you have a very bad case, symptoms may include:  Bone pain.  Muscle pain.  Falling often.  Broken bones caused by a minor injury, due to osteoporosis. DIAGNOSIS A blood test is the best way to tell if you have a vitamin D deficiency. TREATMENT Vitamin D deficiency can be treated in different ways. Treatment for vitamin D deficiency depends on what is causing it. Options include:  Taking vitamin D supplements.  Taking a calcium supplement. Your caregiver will suggest what dose is best for you. HOME CARE INSTRUCTIONS  Take any supplements that your caregiver prescribes. Follow the directions carefully. Take only the suggested amount.  Have your blood tested 2 months after you start taking supplements.  Eat foods that contain vitamin D. Healthy choices  include:  Fortified dairy products, cereals, or juices. Fortified means vitamin D has been added to the food. Check the label on the package to be sure.  Fatty fish like salmon or trout.  Eggs.  Oysters.  Do not use a tanning bed.  Keep your weight at a healthy level. Lose weight if you need to.  Keep all follow-up appointments. Your caregiver will need to perform blood tests to make sure your vitamin D deficiency is going away. SEEK MEDICAL CARE IF:  You have any questions about your treatment.  You continue to have symptoms of vitamin D deficiency.  You have nausea or vomiting.  You are constipated.  You feel confused.  You have severe abdominal or back pain. MAKE SURE YOU:  Understand these instructions.  Will watch your condition.  Will get help right away if you are not doing well or get worse.   Type 1 Diabetes Mellitus, Adult Type 1 diabetes mellitus, often simply referred to as diabetes, is a long-term (chronic) disease. It occurs when the islet cells in the pancreas that make insulin (a hormone) are destroyed and can no longer make insulin. Insulin is needed to move sugars from food into the tissue cells. The tissue cells use the sugars for energy. In people with type 1 diabetes, the sugars build up in the blood instead of going into the tissue cells. As a result, high blood sugar (hyperglycemia) develops. Without insulin, the body breaks down fat cells for the needed energy. This breakdown of fat cells produces acid chemicals (ketones), which increases the acid levels in the body. The effect of either high ketone or sugar (glucose) levels can be life-threatening.  Type 1 diabetes was also previously called juvenile diabetes. It most often occurs before the age of 91, but it can occur at any age. RISK FACTORS A person is predisposed to developing type 1 diabetes if someone in his or her family has the disease and is exposed to certain additional environmental  triggers.  SYMPTOMS  Symptoms of type 1 diabetes may develop gradually over days to weeks or suddenly. The symptoms occur due to hyperglycemia. The symptoms can include:  Increased thirst (polydipsia). Increased urination (polyuria). Increased urination during the night (nocturia). Weight loss. This weight loss may be  rapid. Frequent, recurring infections. Tiredness (fatigue). Weakness. Vision changes, such as blurred vision. Fruity smell to your breath. Abdominal pain. Nausea or vomiting. DIAGNOSIS  Type 1 diabetes is diagnosed when symptoms of diabetes are present and when blood glucose levels are increased. Your blood glucose level may be checked by one or more of the following blood tests: A fasting blood glucose test. You will not be allowed to eat for at least 8 hours before a blood sample is taken. A random blood glucose test. Your blood glucose is checked at any time of the day regardless of when you ate. A hemoglobin A1c blood glucose test. A hemoglobin A1c test provides information about blood glucose control over the previous 3 months. TREATMENT  Although type 1 diabetes cannot be prevented, it can be managed with insulin, diet, and exercise. You will need to take insulin daily to keep blood glucose in the desired range. You will need to match insulin dosing with exercise and healthy food choices. The treatment goal is to maintain the before-meal blood sugar (preprandial glucose) level at 70 130 mg/dL.  HOME CARE INSTRUCTIONS  Have your hemoglobin A1c level checked twice a year. Perform daily blood glucose monitoring as directed by your caregiver. Monitor urine ketones when you are ill and as directed by your caregiver. Take your insulin as directed by your caregiver to maintain your blood glucose level in the desired range. Never run out of insulin. It is needed every day. Adjust insulin based on your intake of carbohydrates. Carbohydrates can raise blood glucose levels  but need to be included in your diet. Carbohydrates provide vitamins, minerals, and fiber, which are an essential part of a healthy diet. Carbohydrates are found in fruits, vegetables, whole grains, dairy products, legumes, and foods containing added sugars.  Eat healthy foods. Alternate 3 meals with 3 snacks. Maintain a healthy weight. Carry a medical alert card or wear your medical alert jewelry. Carry a 15 gram carbohydrate snack with you at all times to treat low blood glucose (hypoglycemia). Some examples of 15 gram carbohydrate snacks include: Glucose tablets, 3 or 4.  Glucose gel, 15 gram tube. Raisins, 2 tablespoons (24 grams). Jelly beans, 6. Animal crackers, 8. Fruit juice, regular soda, or low-fat milk, 4 ounces (120 mL). Gummy treats, 9.  Recognize hypoglycemia. Hypoglycemia occurs with blood glucose levels of 70 mg/dL and below. The risk for hypoglycemia increases when fasting or skipping meals, during or after intense exercise, and during sleep. Hypoglycemia symptoms can include: Tremors or shakes. Decreased ability to concentrate. Sweating. Increased heart rate. Headache. Dry mouth. Hunger. Irritability. Anxiety. Restless sleep. Altered speech or coordination. Confusion. Treat hypoglycemia promptly. If you are alert and able to safely swallow, follow the 15:15 rule: Take 15 20 grams of rapid-acting glucose or carbohydrate. Rapid-acting options include glucose gel, glucose tablets, or 4 ounces (120 mL) of fruit juice, regular soda, or low-fat milk. Check your blood glucose level 15 minutes after taking the glucose.  Take 15 20 grams more of glucose if the repeat blood glucose level is still 70 mg/dL or below. Eat a meal or snack within 1 hour once blood glucose levels return to normal. Be alert to polyuria and polydipsia, which are early signs of hyperglycemia. An early awareness of hyperglycemia allows for prompt treatment. Treat hyperglycemia as directed by your  caregiver. Engage in at least 150 minutes of moderate-intensity physical activity a week, spread over at least 3 days of the week or as directed by your caregiver. Adjust  your insulin dosing and food intake as needed if you start a new exercise or sport. Follow your sick day plan at any time you are unable to eat or drink as usual.  Avoid tobacco use. Limit alcohol intake to no more than 1 drink per day for nonpregnant women and 2 drinks per day for men. You should drink alcohol only when you are also eating food. Talk with your caregiver about whether alcohol is safe for you. Tell your caregiver if you drink alcohol several times a week. Follow up with your caregiver regularly. Schedule an eye exam within 5 years of diagnosis and then annually. Perform daily skin and foot care. Examine your skin and feet daily for cuts, bruises, redness, nail problems, bleeding, blisters, or sores. A foot exam by a caregiver should be done annually. Brush your teeth and gums at least twice a day and floss at least once a day. Follow up with your dentist regularly. Share your diabetes management plan with your workplace or school. Stay up-to-date with immunizations. Learn to manage stress. Obtain ongoing diabetes education and support as needed. Participate or seek rehabilitation as needed to maintain or improve independence and quality of life. Request a physical or occupational therapy referral if you are having foot or hand numbness or difficulties with grooming, dressing, eating, or physical activity. SEEK MEDICAL CARE IF:  You are unable to eat food or drink fluids for more than 6 hours. You have nausea and vomiting for more than 6 hours. Your blood glucose level is over 240 mg/dL. There is a change in mental status. You develop an additional serious illness. You have diarrhea for more than 6 hours. You have been sick or have had a fever for a couple of days and are not getting better. You have pain  during any physical activity. SEEK IMMEDIATE MEDICAL CARE IF: You have difficulty breathing. You have moderate to large ketone levels. MAKE SURE YOU: Understand these instructions. Will watch your condition. Will get help right away if you are not doing well or get worse. Document Released: 10/26/2000 Document Revised: 07/23/2012 Document Reviewed: 05/27/2012 [facility] Patient Information 2014 [employer].

## 2014-01-25 ENCOUNTER — Ambulatory Visit (INDEPENDENT_AMBULATORY_CARE_PROVIDER_SITE_OTHER): Payer: 59 | Admitting: Internal Medicine

## 2014-01-25 ENCOUNTER — Encounter: Payer: Self-pay | Admitting: Internal Medicine

## 2014-01-25 VITALS — BP 124/80 | HR 64 | Temp 96.8°F | Resp 16 | Ht 73.75 in | Wt 248.4 lb

## 2014-01-25 DIAGNOSIS — E291 Testicular hypofunction: Secondary | ICD-10-CM

## 2014-01-25 DIAGNOSIS — E785 Hyperlipidemia, unspecified: Secondary | ICD-10-CM

## 2014-01-25 DIAGNOSIS — E559 Vitamin D deficiency, unspecified: Secondary | ICD-10-CM

## 2014-01-25 DIAGNOSIS — E349 Endocrine disorder, unspecified: Secondary | ICD-10-CM | POA: Insufficient documentation

## 2014-01-25 DIAGNOSIS — E109 Type 1 diabetes mellitus without complications: Secondary | ICD-10-CM

## 2014-01-25 DIAGNOSIS — E119 Type 2 diabetes mellitus without complications: Secondary | ICD-10-CM

## 2014-01-25 DIAGNOSIS — Z79899 Other long term (current) drug therapy: Secondary | ICD-10-CM

## 2014-01-25 DIAGNOSIS — Z794 Long term (current) use of insulin: Secondary | ICD-10-CM

## 2014-01-25 DIAGNOSIS — I10 Essential (primary) hypertension: Secondary | ICD-10-CM

## 2014-01-25 HISTORY — DX: Endocrine disorder, unspecified: E34.9

## 2014-01-25 LAB — BASIC METABOLIC PANEL WITH GFR
BUN: 15 mg/dL (ref 6–23)
CALCIUM: 9.7 mg/dL (ref 8.4–10.5)
CHLORIDE: 100 meq/L (ref 96–112)
CO2: 27 mEq/L (ref 19–32)
Creat: 0.81 mg/dL (ref 0.50–1.35)
GFR, Est Non African American: >89 mL/min
Glucose, Bld: 131 mg/dL — ABNORMAL HIGH (ref 70–99)
POTASSIUM: 4.3 meq/L (ref 3.5–5.3)
SODIUM: 136 meq/L (ref 135–145)

## 2014-01-25 LAB — CBC WITH DIFFERENTIAL/PLATELET
BASOS PCT: 1 % (ref 0–1)
Basophils Absolute: 0.1 10*3/uL (ref 0.0–0.1)
Eosinophils Absolute: 0.3 10*3/uL (ref 0.0–0.7)
Eosinophils Relative: 4 % (ref 0–5)
HCT: 45.3 % (ref 39.0–52.0)
HEMOGLOBIN: 15.4 g/dL (ref 13.0–17.0)
LYMPHS PCT: 40 % (ref 12–46)
Lymphs Abs: 2.8 10*3/uL (ref 0.7–4.0)
MCH: 30.3 pg (ref 26.0–34.0)
MCHC: 34 g/dL (ref 30.0–36.0)
MCV: 89 fL (ref 78.0–100.0)
MONO ABS: 0.5 10*3/uL (ref 0.1–1.0)
MONOS PCT: 7 % (ref 3–12)
NEUTROS ABS: 3.4 10*3/uL (ref 1.7–7.7)
NEUTROS PCT: 48 % (ref 43–77)
Platelets: 288 10*3/uL (ref 150–400)
RBC: 5.09 MIL/uL (ref 4.22–5.81)
RDW: 13.3 % (ref 11.5–15.5)
WBC: 7.1 10*3/uL (ref 4.0–10.5)

## 2014-01-25 LAB — LIPID PANEL
CHOLESTEROL: 166 mg/dL (ref 0–200)
HDL: 37 mg/dL — AB (ref >39)
LDL Cholesterol: 99 mg/dL (ref 0–99)
TRIGLYCERIDES: 149 mg/dL (ref <150)
Total CHOL/HDL Ratio: 4.5 Ratio
VLDL: 30 mg/dL (ref 0–40)

## 2014-01-25 LAB — HEMOGLOBIN A1C
HEMOGLOBIN A1C: 7.7 % — AB (ref <5.7)
Mean Plasma Glucose: 174 mg/dL — ABNORMAL HIGH (ref <117)

## 2014-01-25 LAB — HEPATIC FUNCTION PANEL
ALT: 15 U/L (ref 0–53)
AST: 15 U/L (ref 0–37)
Albumin: 4.5 g/dL (ref 3.5–5.2)
Alkaline Phosphatase: 83 U/L (ref 39–117)
BILIRUBIN TOTAL: 0.5 mg/dL (ref 0.2–1.2)
Bilirubin, Direct: <0.1 mg/dL (ref 0.0–0.3)
TOTAL PROTEIN: 7.7 g/dL (ref 6.0–8.3)

## 2014-01-25 LAB — MAGNESIUM: Magnesium: 1.9 mg/dL (ref 1.5–2.5)

## 2014-01-25 LAB — TSH: TSH: 2.09 u[IU]/mL (ref 0.350–4.500)

## 2014-01-25 LAB — TESTOSTERONE: TESTOSTERONE: 475 ng/dL (ref 300–890)

## 2014-01-25 MED ORDER — DAPAGLIFLOZIN PRO-METFORMIN ER 5-1000 MG PO TB24: 1.0000 | ORAL_TABLET | Freq: Two times a day (BID) | ORAL | Status: DC

## 2014-01-25 MED ORDER — PHENTERMINE HCL 37.5 MG PO TABS: ORAL_TABLET | ORAL | Status: DC

## 2014-01-26 LAB — VITAMIN D 25 HYDROXY (VIT D DEFICIENCY, FRACTURES): Vit D, 25-Hydroxy: 91 ng/mL — ABNORMAL HIGH (ref 30–89)

## 2014-02-23 ENCOUNTER — Other Ambulatory Visit: Payer: Self-pay | Admitting: *Deleted

## 2014-02-23 MED ORDER — DAPAGLIFLOZIN PROPANEDIOL 10 MG PO TABS: 10.0000 | ORAL_TABLET | Freq: Every day | ORAL | Status: DC

## 2014-04-26 ENCOUNTER — Ambulatory Visit: Payer: Self-pay | Admitting: Internal Medicine

## 2014-05-03 ENCOUNTER — Ambulatory Visit (INDEPENDENT_AMBULATORY_CARE_PROVIDER_SITE_OTHER): Payer: 59 | Admitting: Emergency Medicine

## 2014-05-03 ENCOUNTER — Encounter: Payer: Self-pay | Admitting: Emergency Medicine

## 2014-05-03 VITALS — BP 112/70 | HR 66 | Temp 98.2°F | Resp 18 | Ht 73.75 in | Wt 256.0 lb

## 2014-05-03 DIAGNOSIS — E119 Type 2 diabetes mellitus without complications: Secondary | ICD-10-CM

## 2014-05-03 DIAGNOSIS — I10 Essential (primary) hypertension: Secondary | ICD-10-CM

## 2014-05-03 DIAGNOSIS — E291 Testicular hypofunction: Secondary | ICD-10-CM

## 2014-05-03 DIAGNOSIS — Z125 Encounter for screening for malignant neoplasm of prostate: Secondary | ICD-10-CM

## 2014-05-03 DIAGNOSIS — E782 Mixed hyperlipidemia: Secondary | ICD-10-CM

## 2014-05-03 LAB — BASIC METABOLIC PANEL WITH GFR
BUN: 21 mg/dL (ref 6–23)
CALCIUM: 10.1 mg/dL (ref 8.4–10.5)
CO2: 26 mEq/L (ref 19–32)
Chloride: 97 mEq/L (ref 96–112)
Creat: 0.99 mg/dL (ref 0.50–1.35)
GLUCOSE: 212 mg/dL — AB (ref 70–99)
POTASSIUM: 4.6 meq/L (ref 3.5–5.3)
Sodium: 134 mEq/L — ABNORMAL LOW (ref 135–145)

## 2014-05-03 LAB — CBC WITH DIFFERENTIAL/PLATELET
BASOS ABS: 0.1 10*3/uL (ref 0.0–0.1)
BASOS PCT: 1 % (ref 0–1)
EOS ABS: 0.2 10*3/uL (ref 0.0–0.7)
Eosinophils Relative: 2 % (ref 0–5)
HCT: 43 % (ref 39.0–52.0)
Hemoglobin: 15.1 g/dL (ref 13.0–17.0)
LYMPHS PCT: 39 % (ref 12–46)
Lymphs Abs: 3.4 10*3/uL (ref 0.7–4.0)
MCH: 30.6 pg (ref 26.0–34.0)
MCHC: 35.1 g/dL (ref 30.0–36.0)
MCV: 87 fL (ref 78.0–100.0)
MONO ABS: 0.6 10*3/uL (ref 0.1–1.0)
Monocytes Relative: 7 % (ref 3–12)
Neutro Abs: 4.5 10*3/uL (ref 1.7–7.7)
Neutrophils Relative %: 51 % (ref 43–77)
Platelets: 306 10*3/uL (ref 150–400)
RBC: 4.94 MIL/uL (ref 4.22–5.81)
RDW: 13.4 % (ref 11.5–15.5)
WBC: 8.8 10*3/uL (ref 4.0–10.5)

## 2014-05-03 LAB — HEMOGLOBIN A1C
Hgb A1c MFr Bld: 9 % — ABNORMAL HIGH (ref <5.7)
Mean Plasma Glucose: 212 mg/dL — ABNORMAL HIGH (ref <117)

## 2014-05-03 LAB — HEPATIC FUNCTION PANEL
ALBUMIN: 4.4 g/dL (ref 3.5–5.2)
ALT: 27 U/L (ref 0–53)
AST: 18 U/L (ref 0–37)
Alkaline Phosphatase: 101 U/L (ref 39–117)
Bilirubin, Direct: 0.1 mg/dL (ref 0.0–0.3)
Indirect Bilirubin: 0.3 mg/dL (ref 0.2–1.2)
TOTAL PROTEIN: 7.7 g/dL (ref 6.0–8.3)
Total Bilirubin: 0.4 mg/dL (ref 0.2–1.2)

## 2014-05-03 LAB — LIPID PANEL
CHOL/HDL RATIO: 5.4 ratio
Cholesterol: 182 mg/dL (ref 0–200)
HDL: 34 mg/dL — AB (ref >39)
LDL Cholesterol: 101 mg/dL — ABNORMAL HIGH (ref 0–99)
Triglycerides: 236 mg/dL — ABNORMAL HIGH (ref <150)
VLDL: 47 mg/dL — AB (ref 0–40)

## 2014-05-03 MED ORDER — DAPAGLIFLOZIN PRO-METFORMIN ER 10-1000 MG PO TB24: 10.0000 mg | ORAL_TABLET | Freq: Every day | ORAL | Status: DC

## 2014-05-03 NOTE — Progress Notes (Signed)
Subjective:    Patient ID: Arthur Hudson, male    DOB: [birth year], 48 y.o.   MRN: [id number]  HPI Comments: 48 yo WM presents for 3 month F/U for Obesity, HTN, Cholesterol, DM, D. Deficient. He has not been checking BS. He has gained a little weight with recent vacation. He is staying more active. He notes BP is good. He notes occasionally has low BP with dizziness.He is on 16 units BID of insulin. He notes testosterone is due currently.He checks feet routinely and denies skin break down or neuropathy increase. He notes only every couple weeks mild burn/ itch mid foot. He gets pedicures routinely.  His GF is concerned about testosterone and prostate cancer. Patient denies any prostates HX in family. He notes mild improved energy with shots and is currently due. He denies any prostate related symptoms currently.    Diabetes Associated symptoms include fatigue.  Hypertension     Medication List       This list is accurate as of: 05/03/14  9:16 AM.  Always use your most recent med list.               allopurinol 300 MG tablet  Commonly known as:  ZYLOPRIM  Take 300 mg by mouth daily.     aspirin 81 MG tablet  Take 81 mg by mouth daily.     benazepril 20 MG tablet  Commonly known as:  LOTENSIN  TAKE ONE TABLET BY MOUTH AT BEDTIME FOR BLOOD PRESSURE AND KINNEY.     bisoprolol-hydrochlorothiazide 5-6.25 MG per tablet  Commonly known as:  ZIAC  TAKE ONE TABLET BY MOUTH AT BEDTIME     cholecalciferol 1000 UNITS tablet  Commonly known as:  VITAMIN D  Take 1,000 Units by mouth daily.     insulin detemir 100 UNIT/ML injection  Commonly known as:  LEVEMIR  Inject into the skin at bedtime.     metFORMIN 1000 MG tablet  Commonly known as:  GLUCOPHAGE  Take 1,000 mg by mouth 2 (two) times daily with a meal.     NEEDLE (DISP) 21 G 21G X 1" Misc  Inject 2 mLs as directed every 14 (fourteen) days.     testosterone cypionate 200 MG/ML injection  Commonly known as:   DEPO-TESTOSTERONE  2 cc intermuscular every 2 weeks OR as directed by your doctor.       Allergies  Allergen Reactions  . 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 Welchol [Colesevelam Hcl]     Chest pain   Past Medical History  Diagnosis Date  . Hypertension   . Hyperlipidemia   . Diabetes mellitus type 2, insulin dependent   . Fatty liver disease, nonalcoholic   . Gout   . Vitamin D deficiency       Review of Systems  Constitutional: Positive for fatigue.  All other systems reviewed and are negative.  BP 112/70  Pulse 66  Temp(Src) 98.2 F (36.8 C) (Temporal)  Resp 18  Ht 6' 1.75" (1.873 m)  Wt 256 lb (116.121 kg)  BMI 33.10 kg/m2     Objective:   Physical Exam  Nursing note and vitals reviewed. Constitutional: He is oriented to person, place, and time. He appears well-developed and well-nourished.  Obese  HENT:  Head: Normocephalic and atraumatic.  Right Ear: External ear normal.  Left Ear: External ear normal.  Nose: Nose normal.  Eyes: Conjunctivae and EOM are normal.  Neck: Normal range  of motion. Neck supple. No JVD present. No thyromegaly present.  Cardiovascular: Normal rate, regular rhythm, normal heart sounds and intact distal pulses.   Pulmonary/Chest: Effort normal and breath sounds normal.  Abdominal: Soft. Bowel sounds are normal. He exhibits no distension. There is no tenderness. There is no rebound.  Musculoskeletal: Normal range of motion. He exhibits no edema and no tenderness.  Lymphadenopathy:    He has no cervical adenopathy.  Neurological: He is alert and oriented to person, place, and time. He has normal reflexes. No cranial nerve deficit. Coordination normal.  Skin: Skin is warm and dry.  Calluses edge of big toes  Psychiatric: He has a normal mood and affect. His behavior is normal. Judgment and thought content normal.          Assessment & Plan:  1.  3 month F/U for HTN, Cholesterol,DM, D. Deficient.  Needs healthy diet, cardio QD and obtain healthy weight. Check Labs, Check BP if >130/80 call office, Check BS if >200 call office NEEDS Medications Organized. Start Xigduo 10/1000mg  in a.m. And Metformin 1000 mg in p.m. SX Xigduo #35 given RX sent for #30. Decrease Ziac to 1/2 with Hypotension symptoms with heat.   2. Obesity- Continue weight loss, increase activity and better diet. Pt aware of risks. Check labs   3. Hypogonadism- Check labs, advise weight loss, increase activity. Add avocados/ almonds/ zinc if able to tolerate. Discussed concerns of girl friend with prostate CA.  OVER 40 minutes of exam, counseling, chart review, referral performed

## 2014-05-03 NOTE — Patient Instructions (Signed)
Diabetes and Foot Care Diabetes may cause you to have problems because of poor blood supply (circulation) to your feet and legs. This may cause the skin on your feet to become thinner, break easier, and heal more slowly. Your skin may become dry, and the skin may peel and crack. You may also have nerve damage in your legs and feet causing decreased feeling in them. You may not notice minor injuries to your feet that could lead to infections or more serious problems. Taking care of your feet is one of the most important things you can do for yourself.  HOME CARE INSTRUCTIONS  Wear shoes at all times, even in the house. Do not go barefoot. Bare feet are easily injured.  Check your feet daily for blisters, cuts, and redness. If you cannot see the bottom of your feet, use a mirror or ask someone for help.  Wash your feet with warm water (do not use hot water) and mild soap. Then pat your feet and the areas between your toes until they are completely dry. Do not soak your feet as this can dry your skin.  Apply a moisturizing lotion or petroleum jelly (that does not contain alcohol and is unscented) to the skin on your feet and to dry, brittle toenails. Do not apply lotion between your toes.  Trim your toenails straight across. Do not dig under them or around the cuticle. File the edges of your nails with an emery board or nail file.  Do not cut corns or calluses or try to remove them with medicine.  Wear clean socks or stockings every day. Make sure they are not too tight. Do not wear knee-high stockings since they may decrease blood flow to your legs.  Wear shoes that fit properly and have enough cushioning. To break in new shoes, wear them for just a few hours a day. This prevents you from injuring your feet. Always look in your shoes before you put them on to be sure there are no objects inside.  Do not cross your legs. This may decrease the blood flow to your feet.  If you find a minor scrape,  cut, or break in the skin on your feet, keep it and the skin around it clean and dry. These areas may be cleansed with mild soap and water. Do not cleanse the area with peroxide, alcohol, or iodine.  When you remove an adhesive bandage, be sure not to damage the skin around it.  If you have a wound, look at it several times a day to make sure it is healing.  Do not use heating pads or hot water bottles. They may burn your skin. If you have lost feeling in your feet or legs, you may not know it is happening until it is too late.  Make sure your health care provider performs a complete foot exam at least annually or more often if you have foot problems. Report any cuts, sores, or bruises to your health care provider immediately. SEEK MEDICAL CARE IF:   You have an injury that is not healing.  You have cuts or breaks in the skin.  You have an ingrown nail.  You notice redness on your legs or feet.  You feel burning or tingling in your legs or feet.  You have pain or cramps in your legs and feet.  Your legs or feet are numb.  Your feet always feel cold. SEEK IMMEDIATE MEDICAL CARE IF:   There is increasing redness,   swelling, or pain in or around a wound.  There is a red line that goes up your leg.  Pus is coming from a wound.  You develop a fever or as directed by your health care provider.  You notice a bad smell coming from an ulcer or wound. Document Released: 10/26/2000 Document Revised: 07/01/2013 Document Reviewed: 04/07/2013 [employer] Patient Information 2015 [employer]. This information is not intended to replace advice given to you by your health care provider. Make sure you discuss any questions you have with your health care provider.  

## 2014-05-04 LAB — PSA: PSA: 0.22 ng/mL (ref <=4.00)

## 2014-05-04 LAB — TESTOSTERONE: Testosterone: 354 ng/dL (ref 300–890)

## 2014-06-07 ENCOUNTER — Encounter: Payer: Self-pay | Admitting: Physician Assistant

## 2014-06-07 ENCOUNTER — Ambulatory Visit (INDEPENDENT_AMBULATORY_CARE_PROVIDER_SITE_OTHER): Payer: 59 | Admitting: Physician Assistant

## 2014-06-07 VITALS — BP 110/78 | HR 60 | Temp 97.9°F | Resp 16 | Wt 252.0 lb

## 2014-06-07 DIAGNOSIS — Z79899 Other long term (current) drug therapy: Secondary | ICD-10-CM

## 2014-06-07 DIAGNOSIS — E109 Type 1 diabetes mellitus without complications: Secondary | ICD-10-CM

## 2014-06-07 MED ORDER — AZITHROMYCIN 250 MG PO TABS: ORAL_TABLET | ORAL | Status: AC

## 2014-06-07 NOTE — Progress Notes (Signed)
Diabetes Education and Follow-Up Visit  48 y.o.male presents for diabetic education. He has been diabetic for 10-12 years. He was just started on Xigduo last visit, he is also on Novolin N 15 units in the morning and 15 at night. He complains of hypoglycemia , polydipsia, polyuria and visual disturbances. The patient is checking his sugars at home but only once a week.   Home BG Monitoring:  Checking 1 time a week  Low fat/carbohydrate diet?  No Nicotine Abuse?  No Medication Compliance?  Yes Exercise?  No, does mow his lawn and his dad's lawn Alcohol use but no abuse?  Drinks beer every other day  ROS: no polyuria or polydipsia, no chest pain, dyspnea or TIA's, no numbness, tingling or pain in extremities  Physical Exam: Blood pressure 110/78, pulse 60, temperature 97.9 F (36.6 C), resp. rate 16, weight 252 lb (114.306 kg). Body mass index is 32.58 kg/(m^2). Wt Readings from Last 3 Encounters:  06/07/14 252 lb (114.306 kg)  05/03/14 256 lb (116.121 kg)  01/25/14 248 lb 6.4 oz (112.674 kg)   General Appearance:  alert, oriented, no acute distress and obese heart sounds regular rate and rhythm, S1, S2 normal, no murmur, click, rub or gallop, chest clear, no hepatosplenomegaly, no carotid bruits HEENT: throat swollen tonsils, erythematous Labs: Lab Results  Component Value Date   HGBA1C 9.0* 05/03/2014    No results found for this basenameDerl Barrow    Lab Results  Component Value Date   CHOL 182 05/03/2014   HDL 34* 05/03/2014   LDLCALC 101* 05/03/2014   TRIG 236* 05/03/2014   CHOLHDL 5.4 05/03/2014    Eye exam: 12/2013 (Dr. Delman Cheadle)  Assessment: 1.  Diabetes type II  2. BP is at goal. 3. Cholesterol is not at goal.  4. Pharyngitis  Plan: Discussed general issues about diabetes pathophysiology and management. Counseling at today's visit: discussed the need for weight loss. Neurosurgeon distributed. Suggested low cholesterol diet. Encouraged aerobic  exercise. Discussed foot care. Reminded to get yearly retinal exam. Discussed ways to avoid symptomatic hypoglycemia. Labs: BMP. Reminded to bring in blood sugar diary at next visit. Follow up in 2 months or as needed. Zpak  Recommendations: 1.  Patient is counseled on appropriate foot care. 2.  BP goal < 130/80. 3.  LDL goal of < 100, HDL > 40 and TG < 150. 4.  Eye Exam yearly and Dental Exam every 6 months. 5.  Dietary recommendations 6.  Physical Activity recommendations

## 2014-06-07 NOTE — Patient Instructions (Signed)
Bad carbs also include fruit juice, alcohol, and sweet tea. These are empty calories that do not signal to your brain that you are full.   Please remember the good carbs are still carbs which convert into sugar. So please measure them out no more than 1/2-1 cup of rice, oatmeal, pasta, and beans.  Veggies are however free foods! Pile them on.   I like lean protein at every meal such as chicken, [place], pork chops, cottage cheese, etc. Just do not fry these meats and please center your meal around vegetable, the meats should be a side dish.   No all fruit is created equal. Please see the list below, the fruit at the bottom is higher in sugars than the fruit at the top   Diabetes and Standards of Medical Care Diabetes is complicated. You may find that your diabetes team includes a dietitian, nurse, diabetes educator, eye doctor, and more. To help everyone know what is going on and to help you get the care you deserve, the following schedule of care was developed to help keep you on track. Below are the tests, exams, vaccines, medicines, education, and plans you will need. HbA1c test This test shows how well you have controlled your glucose over the past 2-3 months. It is used to see if your diabetes management plan needs to be adjusted.   It is performed at least 2 times a year if you are meeting treatment goals.  It is performed 4 times a year if therapy has changed or if you are not meeting treatment goals. Blood pressure test  This test is performed at every routine medical visit. The goal is less than 140/90 mm Hg for most people, but 130/80 mm Hg in some cases. Ask your health care provider about your goal. Dental exam  Follow up with the dentist regularly. Eye exam  If you are diagnosed with type 1 diabetes as a child, get an exam upon reaching the age of 41 years or older and have had diabetes for 3-5 years. Yearly eye exams are recommended after that initial eye exam.  If you are  diagnosed with type 1 diabetes as an adult, get an exam within 5 years of diagnosis and then yearly.  If you are diagnosed with type 2 diabetes, get an exam as soon as possible after the diagnosis and then yearly. Foot care exam  Visual foot exams are performed at every routine medical visit. The exams check for cuts, injuries, or other problems with the feet.  A comprehensive foot exam should be done yearly. This includes visual inspection as well as assessing foot pulses and testing for loss of sensation.  Check your feet nightly for cuts, injuries, or other problems with your feet. Tell your health care provider if anything is not healing. Kidney function test (urine microalbumin)  This test is performed once a year.  Type 1 diabetes: The first test is performed 5 years after diagnosis.  Type 2 diabetes: The first test is performed at the time of diagnosis.  A serum creatinine and estimated glomerular filtration rate (eGFR) test is done once a year to assess the level of chronic kidney disease (CKD), if present. Lipid profile (cholesterol, HDL, LDL, triglycerides)  Performed every 5 years for most people.  The goal for LDL is less than 100 mg/dL. If you are at high risk, the goal is less than 70 mg/dL.  The goal for HDL is 40 mg/dL-50 mg/dL for men and 50 mg/dL-60  mg/dL for women. An HDL cholesterol of 60 mg/dL or higher gives some protection against heart disease.  The goal for triglycerides is less than 150 mg/dL. Influenza vaccine, pneumococcal vaccine, and hepatitis B vaccine  The influenza vaccine is recommended yearly.  It is recommended that people with diabetes who are over 41 years old get the pneumonia vaccine. In some cases, two separate shots may be given. Ask your health care provider if your pneumonia vaccination is up to date.  The hepatitis B vaccine is also recommended for adults with diabetes. Diabetes self-management education  Education is recommended at  diagnosis and ongoing as needed. Treatment plan  Your treatment plan is reviewed at every medical visit. Document Released: 08/26/2009 Document Revised: 03/15/2014 Document Reviewed: 03/31/2013 [facility] Patient Information 2015 [place], [place]. This information is not intended to replace advice given to you by your health care provider. Make sure you discuss any questions you have with your health care provider.

## 2014-06-08 LAB — BASIC METABOLIC PANEL WITH GFR
BUN: 9 mg/dL (ref 6–23)
CO2: 31 meq/L (ref 19–32)
Calcium: 9.9 mg/dL (ref 8.4–10.5)
Chloride: 99 mEq/L (ref 96–112)
Creat: 0.87 mg/dL (ref 0.50–1.35)
GFR, Est African American: >89 mL/min
GFR, Est Non African American: >89 mL/min
Glucose, Bld: 124 mg/dL — ABNORMAL HIGH (ref 70–99)
Potassium: 4.3 mEq/L (ref 3.5–5.3)
SODIUM: 138 meq/L (ref 135–145)

## 2014-06-08 LAB — MAGNESIUM: Magnesium: 2.1 mg/dL (ref 1.5–2.5)

## 2014-08-22 DIAGNOSIS — E119 Type 2 diabetes mellitus without complications: Secondary | ICD-10-CM | POA: Insufficient documentation

## 2014-08-22 DIAGNOSIS — IMO0001 Reserved for inherently not codable concepts without codable children: Secondary | ICD-10-CM | POA: Insufficient documentation

## 2014-08-22 DIAGNOSIS — N182 Chronic kidney disease, stage 2 (mild): Secondary | ICD-10-CM

## 2014-08-22 DIAGNOSIS — E1122 Type 2 diabetes mellitus with diabetic chronic kidney disease: Secondary | ICD-10-CM | POA: Insufficient documentation

## 2014-08-22 DIAGNOSIS — I129 Hypertensive chronic kidney disease with stage 1 through stage 4 chronic kidney disease, or unspecified chronic kidney disease: Secondary | ICD-10-CM

## 2014-08-22 DIAGNOSIS — M109 Gout, unspecified: Secondary | ICD-10-CM | POA: Insufficient documentation

## 2014-08-22 NOTE — Progress Notes (Addendum)
Patient ID: Arthur Hudson, male   DOB: June 14, 1966, 48 y.o.   MRN: [id number]  [name]

## 2014-08-23 ENCOUNTER — Ambulatory Visit: Payer: Self-pay | Admitting: Internal Medicine

## 2014-08-23 ENCOUNTER — Ambulatory Visit: Payer: 59 | Admitting: Internal Medicine

## 2014-08-23 NOTE — Progress Notes (Signed)
Patient ID: Arthur Hudson, male   DOB: 1966/09/19, 48 y.o.   MRN: [id number]  C a n c e l l e d

## 2014-09-06 ENCOUNTER — Other Ambulatory Visit: Payer: Self-pay | Admitting: Internal Medicine

## 2014-12-10 ENCOUNTER — Other Ambulatory Visit: Payer: Self-pay | Admitting: Internal Medicine

## 2014-12-22 ENCOUNTER — Other Ambulatory Visit: Payer: Self-pay | Admitting: Internal Medicine

## 2014-12-27 ENCOUNTER — Ambulatory Visit: Payer: Self-pay | Admitting: Internal Medicine

## 2015-01-19 ENCOUNTER — Other Ambulatory Visit: Payer: Self-pay | Admitting: Internal Medicine

## 2015-01-21 ENCOUNTER — Emergency Department (HOSPITAL_COMMUNITY): Payer: 59

## 2015-01-21 ENCOUNTER — Emergency Department (HOSPITAL_COMMUNITY)
Admission: EM | Admit: 2015-01-21 | Discharge: 2015-01-22 | Disposition: A | Discharge status: Home / Self Care | Payer: 59 | Attending: Emergency Medicine | Admitting: Emergency Medicine

## 2015-01-21 ENCOUNTER — Encounter (HOSPITAL_COMMUNITY): Payer: Self-pay

## 2015-01-21 DIAGNOSIS — Z7982 Long term (current) use of aspirin: Secondary | ICD-10-CM | POA: Diagnosis not present

## 2015-01-21 DIAGNOSIS — R109 Unspecified abdominal pain: Secondary | ICD-10-CM

## 2015-01-21 DIAGNOSIS — N201 Calculus of ureter: Secondary | ICD-10-CM | POA: Diagnosis not present

## 2015-01-21 DIAGNOSIS — M109 Gout, unspecified: Secondary | ICD-10-CM | POA: Insufficient documentation

## 2015-01-21 DIAGNOSIS — E119 Type 2 diabetes mellitus without complications: Secondary | ICD-10-CM | POA: Diagnosis not present

## 2015-01-21 DIAGNOSIS — I10 Essential (primary) hypertension: Secondary | ICD-10-CM | POA: Insufficient documentation

## 2015-01-21 DIAGNOSIS — Z79899 Other long term (current) drug therapy: Secondary | ICD-10-CM | POA: Diagnosis not present

## 2015-01-21 DIAGNOSIS — Z720 Tobacco use: Secondary | ICD-10-CM | POA: Insufficient documentation

## 2015-01-21 DIAGNOSIS — Z794 Long term (current) use of insulin: Secondary | ICD-10-CM | POA: Diagnosis not present

## 2015-01-21 DIAGNOSIS — R1011 Right upper quadrant pain: Secondary | ICD-10-CM | POA: Diagnosis present

## 2015-01-21 DIAGNOSIS — E559 Vitamin D deficiency, unspecified: Secondary | ICD-10-CM | POA: Insufficient documentation

## 2015-01-21 DIAGNOSIS — Z8719 Personal history of other diseases of the digestive system: Secondary | ICD-10-CM | POA: Diagnosis not present

## 2015-01-21 LAB — CBC WITH DIFFERENTIAL/PLATELET
BASOS PCT: 1 % (ref 0–1)
Basophils Absolute: 0 10*3/uL (ref 0.0–0.1)
EOS ABS: 0 10*3/uL (ref 0.0–0.7)
EOS PCT: 0 % (ref 0–5)
HCT: 41.9 % (ref 39.0–52.0)
Hemoglobin: 14.3 g/dL (ref 13.0–17.0)
LYMPHS ABS: 1.7 10*3/uL (ref 0.7–4.0)
Lymphocytes Relative: 20 % (ref 12–46)
MCH: 30.7 pg (ref 26.0–34.0)
MCHC: 34.1 g/dL (ref 30.0–36.0)
MCV: 89.9 fL (ref 78.0–100.0)
Monocytes Absolute: 0.5 10*3/uL (ref 0.1–1.0)
Monocytes Relative: 6 % (ref 3–12)
Neutro Abs: 6.1 10*3/uL (ref 1.7–7.7)
Neutrophils Relative %: 73 % (ref 43–77)
Platelets: 239 10*3/uL (ref 150–400)
RBC: 4.66 MIL/uL (ref 4.22–5.81)
RDW: 13 % (ref 11.5–15.5)
WBC: 8.3 10*3/uL (ref 4.0–10.5)

## 2015-01-21 LAB — COMPREHENSIVE METABOLIC PANEL
ALT: 31 U/L (ref 0–53)
AST: 27 U/L (ref 0–37)
Albumin: 4.1 g/dL (ref 3.5–5.2)
Alkaline Phosphatase: 78 U/L (ref 39–117)
Anion gap: 9 (ref 5–15)
BUN: 11 mg/dL (ref 6–23)
CHLORIDE: 101 mmol/L (ref 96–112)
CO2: 28 mmol/L (ref 19–32)
CREATININE: 1.19 mg/dL (ref 0.50–1.35)
Calcium: 9.4 mg/dL (ref 8.4–10.5)
GFR, EST AFRICAN AMERICAN: 81 mL/min — AB (ref >90)
GFR, EST NON AFRICAN AMERICAN: 70 mL/min — AB (ref >90)
GLUCOSE: 185 mg/dL — AB (ref 70–99)
Potassium: 3.9 mmol/L (ref 3.5–5.1)
Sodium: 138 mmol/L (ref 135–145)
TOTAL PROTEIN: 7.6 g/dL (ref 6.0–8.3)
Total Bilirubin: 0.6 mg/dL (ref 0.3–1.2)

## 2015-01-21 LAB — LIPASE, BLOOD: LIPASE: 46 U/L (ref 11–59)

## 2015-01-21 NOTE — ED Notes (Signed)
PA at bedside.

## 2015-01-21 NOTE — ED Notes (Signed)
Pt reporting right-sided abdominal pain that started this morning.  Pt sts he threw up about 1 o'clock and noticed some blood in it.  Emesis x 2.

## 2015-01-21 NOTE — ED Provider Notes (Signed)
CSN: [id number]     Arrival date & time 01/21/15  1622 History   First MD Initiated Contact with Patient 01/21/15 1947     Chief Complaint  Patient presents with  . Abdominal Pain  . Hemoptysis     (Consider location/radiation/quality/duration/timing/severity/associated sxs/prior Treatment) HPI   The patient is a 49 y/o male with diabetes and hypertension who presents to the emergency department for RUQ/side pain and emesis. He had an acute episode of pain this morning that was crampy and did not radiate. He endorses nausea and vomited twice. He states that there was blood at the end of his emesis. He drove himself to the ED. The pain slowly subsided and since presenting has almost completely resolved. Marland Kitchen He still endorses tenderness in the area. He denies fevers, chills, headache, dizziness, dysuria, diarrhea, numbness/tingling. He does state that he was short of breath during the episode of pain when he leaned back in a chair and this was relieved by leaning forward. He is not short of breath now. He has not been sick recently. He ate eggs and grits several hours prior to this pain. He admits to a history of GERD, for which he took Nexium. He has not taken the Nexium or had symptoms for several years.  Past Medical History  Diagnosis Date  . Hypertension   . Hyperlipidemia   . Diabetes mellitus type 2, insulin dependent   . Fatty liver disease, nonalcoholic   . Gout   . Vitamin D deficiency    History reviewed. No pertinent past surgical history. Family History  Problem Relation Age of Onset  . Hypertension Mother   . Diabetes Mother   . Asthma Mother   . Diabetes Father   . Hypertension Father   . Stroke Father   . Hypertension Sister   . Hyperlipidemia Sister    History  Substance Use Topics  . Smoking status: Current Some Day Smoker    Types: Cigars  . Smokeless tobacco: Never Used  . Alcohol Use: Yes     Comment: social    Review of Systems  All other systems  negative except as documented in the HPI. All pertinent positives and negatives as reviewed in the HPI.  Allergies  Ppd and Welchol  Home Medications   Prior to Admission medications   Medication Sig Start Date End Date Taking? Authorizing Provider  allopurinol (ZYLOPRIM) 300 MG tablet Take 300 mg by mouth daily.    Historical Provider, MD  aspirin 81 MG tablet Take 81 mg by mouth daily.    Historical Provider, MD  benazepril (LOTENSIN) 20 MG tablet TAKE ONE TABLET BY MOUTH AT BEDTIME FOR BLOOD PRESSURE AND KINNEY. 10/26/13   Vicie Mutters, PA-C  benazepril (LOTENSIN) 20 MG tablet TAKE ONE TABLET BY MOUTH AT BEDTIME FOR BLOOD PRESSURE AND KIDNEYS 12/10/14   Unk Pinto, MD  bisoprolol-hydrochlorothiazide [facility]) 5-6.25 MG per tablet TAKE ONE TABLET BY MOUTH AT BEDTIME 12/10/14   Unk Pinto, MD  cholecalciferol (VITAMIN D) 1000 UNITS tablet Take 1,000 Units by mouth daily.    Historical Provider, MD  Dapagliflozin-Metformin HCl ER (XIGDUO XR) 08-999 MG TB24 Take 10-1,000 mg by mouth daily. 1 po QD 05/03/14   Kelby Aline, PA-C  insulin detemir (LEVEMIR) 100 UNIT/ML injection Inject into the skin at bedtime.    Historical Provider, MD  metFORMIN (GLUCOPHAGE) 1000 MG tablet Take 1,000 mg by mouth daily.     Historical Provider, MD  NEEDLE, DISP, 21 G 21G X 1" MISC  Inject 2 mLs as directed every 14 (fourteen) days. 12/22/13   Vicie Mutters, PA-C  testosterone cypionate (DEPO-TESTOSTERONE) 200 MG/ML injection 2 cc intermuscular every 2 weeks OR as directed by your doctor. 12/22/13   Vicie Mutters, PA-C   BP 149/86 mmHg  Pulse 76  Temp(Src) 98.8 F (37.1 C) (Oral)  Resp 18  Ht 6\' 1"  (1.854 m)  Wt 251 lb 14.4 oz (114.261 kg)  BMI 33.24 kg/m2  SpO2 96% Physical Exam  Constitutional: He is oriented to person, place, and time. He appears well-developed and well-nourished. No distress.  HENT:  Head: Normocephalic and atraumatic.  Mouth/Throat: Oropharynx is clear and moist.  Eyes: EOM  are normal. Pupils are equal, round, and reactive to light.  Neck: Normal range of motion. Neck supple.  Cardiovascular: Normal rate, regular rhythm and normal heart sounds.  Exam reveals no gallop and no friction rub.   No murmur heard. Pulmonary/Chest: Effort normal and breath sounds normal. No respiratory distress.  Abdominal: Soft. Bowel sounds are normal. He exhibits no distension. There is tenderness in the right upper quadrant. There is guarding and positive Murphy's sign. There is no CVA tenderness.    Lymphadenopathy:    He has no cervical adenopathy.  Neurological: He is alert and oriented to person, place, and time.  Skin: Skin is warm and dry. No rash noted. No erythema.  Psychiatric: He has a normal mood and affect.  Nursing note and vitals reviewed.   ED Course  Procedures (including critical care time) Labs Review Labs Reviewed  COMPREHENSIVE METABOLIC PANEL - Abnormal; Notable for the following:    Glucose, Bld 185 (*)    GFR calc non Af Amer 70 (*)    GFR calc Af Amer 81 (*)    All other components within normal limits  CBC WITH DIFFERENTIAL/PLATELET  LIPASE, BLOOD  URINALYSIS, ROUTINE W REFLEX MICROSCOPIC    Imaging Review Ct Renal Stone Study  01/21/2015   CLINICAL DATA:  49 year old male with right-sided abdominal pain and hematemesis.  EXAM: CT ABDOMEN AND PELVIS WITHOUT CONTRAST  TECHNIQUE: Multidetector CT imaging of the abdomen and pelvis was performed following the standard protocol without IV contrast.  COMPARISON:  CT of the abdomen and pelvis 11/27/2011.  FINDINGS: Lower chest:  Unremarkable.  Hepatobiliary: Heterogeneous areas of low attenuation are noted in the liver, compatible with hepatic steatosis. No discrete cystic or solid hepatic lesions. Gallbladder is unremarkable in appearance.  Pancreas: 1.4 x 0.9 cm subtle low-attenuation lesion in the pancreatic head, unchanged in size compared to prior MRI 12/05/2011, presumably a tiny benign lesion such as  a pancreatic pseudocyst. Otherwise, unremarkable.  Spleen: Unremarkable.  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 There is also an 8 mm calculus at the right ureterovesicular junction (image 41 of series 201). This is associated with mild right-sided hydronephrosis and perinephric stranding. No additional calculi are noted along the course of either ureter or within the lumen of the urinary bladder. 5.7 cm exophytic low-attenuation lesion in the upper pole of the right kidney is incompletely characterized on today's noncontrast CT examination, but has previously been characterized as a simple cyst. Left kidney is otherwise unremarkable in appearance on today's noncontrast CT examination. Urinary bladder is unremarkable in appearance. Bilateral adrenal glands are normal in appearance.  Stomach/Bowel: The unenhanced appearance of the stomach is normal. No pathologic  dilatation of small bowel or colon. Normal appendix.  Vascular/Lymphatic: Atherosclerosis throughout the abdominal and pelvic vasculature, without evidence of aneurysm. No lymphadenopathy noted in the abdomen or pelvis on today's non contrast CT examination.  Reproductive: Prostate gland and seminal vesicles are unremarkable in appearance.  Other: No significant volume of ascites.  No pneumoperitoneum.  Musculoskeletal: There are no aggressive appearing lytic or blastic lesions noted in the visualized portions of the skeleton.  IMPRESSION: 1. 8 mm calculus at the right ureteropelvic junction with mild proximal hydronephrosis and perinephric stranding indicating mild obstruction at this time. 2. Multiple other nonobstructive calculi are noted within the collecting systems of the kidneys bilaterally. 3. Normal appendix. 4. Mild hepatic steatosis. 5. Additional incidental findings, as  above.   Electronically Signed   By: Vinnie Langton M.D.   On: 01/21/2015 22:55    I spoke with the urologist on-call, who will follow up with the patient on Monday.  The patient is currently at minimal pain.  He is advised to return here for worsening in his condition.  He agrees to the plan and all questions were answered    Dalia Heading, PA-C 01/22/15 0211  Orlie Dakin, MD 01/22/15 [id number]

## 2015-01-21 NOTE — ED Provider Notes (Signed)
Complaint of right flank pain onset this morning. Patient vomited one time "my breakfast" mixed with approximately 2 teaspoons of blood. He is not nauseated at present. No other associated symptoms. Pain is minimal presently since treatment in the emergency department. Presently described it 1 on a scale of 1-10  Orlie Dakin, MD 01/22/15 1694

## 2015-01-22 LAB — URINALYSIS, ROUTINE W REFLEX MICROSCOPIC
BILIRUBIN URINE: NEGATIVE
Glucose, UA: >1000 mg/dL — AB
Hgb urine dipstick: NEGATIVE
KETONES UR: 15 mg/dL — AB
Leukocytes, UA: NEGATIVE
NITRITE: NEGATIVE
PROTEIN: NEGATIVE mg/dL
SPECIFIC GRAVITY, URINE: 1.041 — AB (ref 1.005–1.030)
UROBILINOGEN UA: 0.2 mg/dL (ref 0.0–1.0)
pH: 5 (ref 5.0–8.0)

## 2015-01-22 LAB — URINE MICROSCOPIC-ADD ON

## 2015-01-22 MED ORDER — PROMETHAZINE HCL 25 MG PO TABS: 25.0000 mg | ORAL_TABLET | Freq: Three times a day (TID) | ORAL | Status: DC | PRN

## 2015-01-22 MED ORDER — OXYCODONE-ACETAMINOPHEN 10-325 MG PO TABS: 1.0000 | ORAL_TABLET | ORAL | Status: DC | PRN

## 2015-01-22 NOTE — Discharge Instructions (Signed)
Return here as needed. Follow up with the Urologist provided.  °

## 2015-01-22 NOTE — ED Notes (Signed)
Pt instructed to strain urine and preserve stone if passed, informed to bring to urology f/u appointment.

## 2015-01-24 ENCOUNTER — Ambulatory Visit (INDEPENDENT_AMBULATORY_CARE_PROVIDER_SITE_OTHER): Payer: 59 | Admitting: Internal Medicine

## 2015-01-24 VITALS — BP 158/82 | HR 78 | Temp 98.0°F | Resp 18 | Ht 73.75 in | Wt 261.0 lb

## 2015-01-24 DIAGNOSIS — Z79899 Other long term (current) drug therapy: Secondary | ICD-10-CM

## 2015-01-24 DIAGNOSIS — E109 Type 1 diabetes mellitus without complications: Secondary | ICD-10-CM

## 2015-01-24 DIAGNOSIS — N23 Unspecified renal colic: Secondary | ICD-10-CM

## 2015-01-24 DIAGNOSIS — I10 Essential (primary) hypertension: Secondary | ICD-10-CM

## 2015-01-24 DIAGNOSIS — E785 Hyperlipidemia, unspecified: Secondary | ICD-10-CM

## 2015-01-24 DIAGNOSIS — E559 Vitamin D deficiency, unspecified: Secondary | ICD-10-CM

## 2015-01-24 LAB — CBC WITH DIFFERENTIAL/PLATELET
Basophils Absolute: 0 10*3/uL (ref 0.0–0.1)
Basophils Relative: 0 % (ref 0–1)
Eosinophils Absolute: 0.2 10*3/uL (ref 0.0–0.7)
Eosinophils Relative: 2 % (ref 0–5)
HEMATOCRIT: 41.4 % (ref 39.0–52.0)
Hemoglobin: 14.2 g/dL (ref 13.0–17.0)
LYMPHS PCT: 27 % (ref 12–46)
Lymphs Abs: 2.5 10*3/uL (ref 0.7–4.0)
MCH: 30.5 pg (ref 26.0–34.0)
MCHC: 34.3 g/dL (ref 30.0–36.0)
MCV: 88.8 fL (ref 78.0–100.0)
MONO ABS: 0.8 10*3/uL (ref 0.1–1.0)
MONOS PCT: 9 % (ref 3–12)
MPV: 10.2 fL (ref 8.6–12.4)
Neutro Abs: 5.6 10*3/uL (ref 1.7–7.7)
Neutrophils Relative %: 62 % (ref 43–77)
Platelets: 228 10*3/uL (ref 150–400)
RBC: 4.66 MIL/uL (ref 4.22–5.81)
RDW: 13.5 % (ref 11.5–15.5)
WBC: 9.1 10*3/uL (ref 4.0–10.5)

## 2015-01-24 MED ORDER — BISOPROLOL-HYDROCHLOROTHIAZIDE 5-6.25 MG PO TABS: 1.0000 | ORAL_TABLET | Freq: Every day | ORAL | Status: DC

## 2015-01-24 MED ORDER — BENAZEPRIL HCL 20 MG PO TABS: ORAL_TABLET | ORAL | Status: DC

## 2015-01-24 NOTE — Progress Notes (Signed)
Patient ID: Kushal Saunders Macdowell, male   DOB: 1965/11/13, 49 y.o.   MRN: [id number]  Assessment and Plan:  Hypertension:  -Continue medication -monitor blood pressure at home. -Continue DASH diet -Reminder to go to the ER if any CP, SOB, nausea, dizziness, severe HA, changes vision/speech, left arm numbness and tingling and jaw pain. -refills given.  Patient off BP meds today as he ran out  Cholesterol - Continue diet and exercise -Check cholesterol.   Diabetes without complicationsa -Continue diet and exercise.  -Check A1C -encouraged checking sugars regularly  Vitamin D Def -check level -continue medications.   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 Further disposition pending results of labs. Discussed med's effects and SE's.    HPI 49 y.o. male  presents for 3 month follow up with hypertension, hyperlipidemia, diabetes and vitamin D deficiency.   His blood pressure has not been controlled at home,  He does not check it.  He has been off his medications for 4-5 days since he ran out. today their BP is BP: (!) 158/82 mmHg.He does workout.  He does a lot of walking and walks a lot at work. He denies chest pain, shortness of breath, dizziness.   He is not on cholesterol medication and denies myalgias. His cholesterol is at goal. The cholesterol was:  05/03/2014: Cholesterol, Total 182; HDL-C 34*; LDL (calc) 101*; Triglycerides 236*   He has not been working on diet and exercise for diabetes without complications, he is on bASA, he is on ACE/ARB, and denies  foot ulcerations, polydipsia, polyuria, visual disturbances, vomiting and weight loss. Last A1C was: 05/03/2014: Hemoglobin-A1c 9.0*   Patient is on Vitamin D supplement. 01/25/2014: VITD 91*   Patient was recently seen on 01/21/15 for right side pain and nausea and vomiting.  He was diagnosed with a kidney stone.  No history of kidney stones prior.  Has still had the  occasional side pain.  He is still taking the percocet for his pain.  He reports that he is taking three daily.  He also took 2 ibuprofen yesterday.  With medications on board his pain is a 1/10.     Current Medications:  Current Outpatient Prescriptions on File Prior to Visit  Medication Sig Dispense Refill  . aspirin 81 MG tablet Take 81 mg by mouth daily.    . cholecalciferol (VITAMIN D) 1000 UNITS tablet Take 1,000 Units by mouth every other day.     . Dapagliflozin-Metformin HCl ER (XIGDUO XR) 08-999 MG TB24 Take 10-1,000 mg by mouth daily. 1 po QD 30 tablet 6  . insulin detemir (LEVEMIR) 100 UNIT/ML injection Inject 15 Units into the skin 2 (two) times daily.     . metFORMIN (GLUCOPHAGE) 1000 MG tablet Take 1,000 mg by mouth at bedtime.     Marland Kitchen NEEDLE, DISP, 21 G 21G X 1" MISC Inject 2 mLs as directed every 14 (fourteen) days. 100 each 1  . oxyCODONE-acetaminophen (PERCOCET) 10-325 MG per tablet Take 1 tablet by mouth every 4 (four) hours as needed for pain. 25 tablet 0  . promethazine (PHENERGAN) 25 MG tablet Take 1 tablet (25 mg total) by mouth every 8 (eight) hours as needed for nausea or vomiting. 15 tablet 0  . allopurinol (ZYLOPRIM) 300 MG tablet Take 300 mg by mouth daily.    . benazepril (LOTENSIN) 20 MG tablet TAKE ONE TABLET BY MOUTH AT BEDTIME FOR BLOOD PRESSURE AND KINNEY. (Patient not taking: Reported on 01/24/2015)  90 tablet 0  . benazepril (LOTENSIN) 20 MG tablet TAKE ONE TABLET BY MOUTH AT BEDTIME FOR BLOOD PRESSURE AND KIDNEYS (Patient not taking: Reported on 01/21/2015) 30 tablet 0  . bisoprolol-hydrochlorothiazide (ZIAC) 5-6.25 MG per tablet TAKE ONE TABLET BY MOUTH AT BEDTIME (Patient not taking: Reported on 01/21/2015) 30 tablet 0  . bisoprolol-hydrochlorothiazide (ZIAC) 5-6.25 MG per tablet Take 1 tablet by mouth daily. Patient states he also takes the Benazepril per patient    . Dapagliflozin-Metformin HCl ER 08-999 MG TB24 Take 1 tablet by mouth daily. Patient states he  takes the xiigduo ER in the AM and the metformin 1000mg  at bedtime per patient    . testosterone cypionate (DEPO-TESTOSTERONE) 200 MG/ML injection 2 cc intermuscular every 2 weeks OR as directed by your doctor. (Patient not taking: Reported on 01/21/2015) 10 mL 1   No current facility-administered medications on file prior to visit.   Medical History:  Past Medical History  Diagnosis Date  . Hypertension   . Hyperlipidemia   . Diabetes mellitus type 2, insulin dependent   . Fatty liver disease, nonalcoholic   . Gout   . Vitamin D deficiency    Allergies:  Allergies  Allergen Reactions  . 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 Welchol [Colesevelam Hcl]     Chest pain     Review of Systems:  Review of Systems  Constitutional: Negative for fever and chills.  HENT: Negative.   Eyes: Negative.   Respiratory: Negative for cough, sputum production, shortness of breath and wheezing.   Cardiovascular: Negative for chest pain, palpitations and leg swelling.  Gastrointestinal: Positive for abdominal pain. Negative for nausea, vomiting, diarrhea, constipation, blood in stool and melena.  Genitourinary: Positive for frequency, hematuria and flank pain. Negative for dysuria and urgency.  Musculoskeletal: Negative.   Skin: Negative for rash.    Family history- Review and unchanged  Social history- Review and unchanged  Physical Exam: BP 158/82 mmHg  Pulse 78  Temp(Src) 98 F (36.7 C) (Temporal)  Resp 18  Ht 6' 1.75" (1.873 m)  Wt 261 lb (118.389 kg)  BMI 33.75 kg/m2 Wt Readings from Last 3 Encounters:  01/24/15 261 lb (118.389 kg)  01/21/15 251 lb 14.4 oz (114.261 kg)  06/07/14 252 lb (114.306 kg)   General Appearance: Well nourished well developed, non-toxic appearing, in no apparent distress. Eyes: PERRLA, EOMs, conjunctiva no swelling or erythema ENT/Mouth: Ear canals clear with no erythema, swelling, or discharge.  TMs normal bilaterally,  oropharynx clear, moist, with no exudate.   Neck: Supple, thyroid normal, no JVD, no cervical adenopathy.  Respiratory: Respiratory effort normal, breath sounds clear A&P, no wheeze, rhonchi or rales noted.  No retractions, no accessory muscle usage Cardio: RRR with no MRGs. No noted edema.  Abdomen: Soft, + BS.  Tender to palpation in the RLQ with no tenderness over McBurneys point. Right CVA tenderness, no guarding, rebound, hernias, masses. Musculoskeletal: Full ROM, 5/5 strength, Normal gait Skin: Warm, dry without rashes, lesions, ecchymosis.  Neuro: Awake and oriented X 3, Cranial nerves intact. No cerebellar symptoms.  Psych: normal affect, Insight and Judgment appropriate.    FORCUCCI, Mackson Botz, PA-C 4:29 PM [facility]

## 2015-01-24 NOTE — Patient Instructions (Signed)
   Recommend the book "The END of DIETING" by Dr Joel Fuhrman   & the book "The END of DIABETES " by Dr Joel Fuhrman  At [employer] - get book & Audio CD's      Being diabetic has a  300% increased risk for heart attack, stroke, cancer, and alzheimer- type vascular dementia. It is very important that you work harder with diet by avoiding all foods that are white. Avoid white rice (brown & wild rice is OK), white potatoes (sweetpotatoes in moderation is OK), White bread or wheat bread or anything made out of white flour like bagels, donuts, rolls, buns, biscuits, cakes, pastries, cookies, pizza crust, and pasta (made from white flour & egg whites) - vegetarian pasta or spinach or wheat pasta is OK. Multigrain breads like Arnold's or Pepperidge Farm, or multigrain sandwich thins or flatbreads.  Diet, exercise and weight loss can reverse and cure diabetes in the early stages.  Diet, exercise and weight loss is very important in the control and prevention of complications of diabetes which affects every system in your body, ie. Brain - dementia/stroke, eyes - glaucoma/blindness, heart - heart attack/heart failure, kidneys - dialysis, stomach - gastric paralysis, intestines - malabsorption, nerves - severe painful neuritis, circulation - gangrene & loss of a leg(s), and finally cancer and Alzheimers.    I recommend avoid fried & greasy foods,  sweets/candy, white rice (brown or wild rice or Quinoa is OK), white potatoes (sweet potatoes are OK) - anything made from white flour - bagels, doughnuts, rolls, buns, biscuits,white and wheat breads, pizza crust and traditional pasta made of white flour & egg white(vegetarian pasta or spinach or wheat pasta is OK).  Multi-grain bread is OK - like multi-grain flat bread or sandwich thins. Avoid alcohol in excess. Exercise is also important.    Eat all the vegetables you want - avoid meat, especially red meat and dairy - especially cheese.  Cheese is the most  concentrated form of trans-fats which is the worst thing to clog up our arteries. Veggie cheese is OK which can be found in the fresh produce section at [employer] or [employer] or [employer]     Bad carbs also include fruit juice, alcohol, and sweet tea. These are empty calories that do not signal to your brain that you are full.   Please remember the good carbs are still carbs which convert into sugar. So please measure them out no more than 1/2-1 cup of rice, oatmeal, pasta, and beans  Veggies are however free foods! Pile them on.   Not all fruit is created equal. Please see the list below, the fruit at the bottom is higher in sugars than the fruit at the top. Please avoid all dried fruits.     

## 2015-01-25 ENCOUNTER — Other Ambulatory Visit: Payer: Self-pay | Admitting: Urology

## 2015-01-25 LAB — INSULIN, FASTING: INSULIN FASTING, SERUM: 31.9 u[IU]/mL — AB (ref 2.0–19.6)

## 2015-01-25 LAB — LIPID PANEL
Cholesterol: 176 mg/dL (ref 0–200)
HDL: 42 mg/dL
LDL Cholesterol: 105 mg/dL — ABNORMAL HIGH (ref 0–99)
Total CHOL/HDL Ratio: 4.2 ratio
Triglycerides: 144 mg/dL
VLDL: 29 mg/dL (ref 0–40)

## 2015-01-25 LAB — TSH: TSH: 3.855 u[IU]/mL (ref 0.350–4.500)

## 2015-01-25 LAB — HEPATIC FUNCTION PANEL
ALT: 18 U/L (ref 0–53)
AST: 13 U/L (ref 0–37)
Albumin: 3.9 g/dL (ref 3.5–5.2)
Alkaline Phosphatase: 58 U/L (ref 39–117)
BILIRUBIN DIRECT: 0.1 mg/dL (ref 0.0–0.3)
BILIRUBIN TOTAL: 0.5 mg/dL (ref 0.2–1.2)
Indirect Bilirubin: 0.4 mg/dL (ref 0.2–1.2)
Total Protein: 7.2 g/dL (ref 6.0–8.3)

## 2015-01-25 LAB — BASIC METABOLIC PANEL WITHOUT GFR
BUN: 15 mg/dL (ref 6–23)
CO2: 27 meq/L (ref 19–32)
Calcium: 9.2 mg/dL (ref 8.4–10.5)
Chloride: 99 meq/L (ref 96–112)
Creat: 1.18 mg/dL (ref 0.50–1.35)
GFR, Est African American: 83 mL/min
GFR, Est Non African American: 72 mL/min
Glucose, Bld: 134 mg/dL — ABNORMAL HIGH (ref 70–99)
Potassium: 3.8 meq/L (ref 3.5–5.3)
Sodium: 137 meq/L (ref 135–145)

## 2015-01-25 LAB — MAGNESIUM: MAGNESIUM: 1.9 mg/dL (ref 1.5–2.5)

## 2015-01-25 LAB — HEMOGLOBIN A1C
Hgb A1c MFr Bld: 8 % — ABNORMAL HIGH
Mean Plasma Glucose: 183 mg/dL — ABNORMAL HIGH

## 2015-01-25 LAB — VITAMIN D 25 HYDROXY (VIT D DEFICIENCY, FRACTURES): Vit D, 25-Hydroxy: 56 ng/mL (ref 30–100)

## 2015-01-26 NOTE — Patient Instructions (Addendum)
Marlo M Bade  01/26/2015   Your procedure is scheduled on: 02/01/15   Report to [facility] Main  Entrance and follow signs to               [place] at 1:15 PM.   Call this number if you have problems the morning of surgery [phone]   Remember:  Do not eat food  :After Midnight.             MAY HAVE CLEAR LIQUIDS UNTIL 9:00 AM    CLEAR LIQUID DIET   Foods Allowed                                                                     Foods Excluded  Coffee and tea, regular and decaf                             liquids that you cannot  Plain Jell-O in any flavor                                             see through such as: Fruit ices (not with fruit pulp)                                     milk, soups, orange juice  Iced Popsicles                                               All solid food Carbonated beverages, regular and diet                                    Cranberry, grape and apple juices Sports drinks like Gatorade Lightly seasoned clear broth or consume(fat free) Sugar, honey syrup  _____________________________________________________________________   Take these medicines the morning of surgery with A SIP OF WATER: MAY TAKE PERCOCET / ALLOPURINOL IF NEEDED              TAKE ONLY 1/2 DOSE OF LEVEMIR THE NIGHT BEFORE SURGERY                               You may not have any metal on your body including hair pins and              piercings  Do not wear jewelry, make-up, lotions, powders or perfumes.             Do not wear nail polish.  Do not shave  48 hours prior to surgery.              Men may shave face and neck.   Do not bring valuables to the  hospital. [place] IS NOT             RESPONSIBLE   FOR VALUABLES.  Contacts, dentures or bridgework may not be worn into surgery.  Leave suitcase in the car. After surgery it may be brought to your room.     Patients discharged the day of surgery will not be allowed to  drive home.  Name and phone number of your driver:  Special Instructions: N/A              Please read over the following fact sheets you were given: _____________________________________________________________________                                                     [place]  Before surgery, you can play an important role.  Because skin is not sterile, your skin needs to be as free of germs as possible.  You can reduce the number of germs on your skin by washing with CHG (chlorahexidine gluconate) soap before surgery.  CHG is an antiseptic cleaner which kills germs and bonds with the skin to continue killing germs even after washing. Please DO NOT use if you have an allergy to CHG or antibacterial soaps.  If your skin becomes reddened/irritated stop using the CHG and inform your nurse when you arrive at Short Stay. Do not shave (including legs and underarms) for at least 48 hours prior to the first CHG shower.  You may shave your face. Please follow these instructions carefully:   1.  Shower with CHG Soap the night before surgery and the  morning of Surgery.   2.  If you choose to wash your hair, wash your hair first as usual with your  normal  Shampoo.   3.  After you shampoo, rinse your hair and body thoroughly to remove the  shampoo.                                         4.  Use CHG as you would any other liquid soap.  You can apply chg directly  to the skin and wash . Gently wash with scrungie or clean wascloth    5.  Apply the CHG Soap to your body ONLY FROM THE NECK DOWN.   Do not use on open                           Wound or open sores. Avoid contact with eyes, ears mouth and genitals (private parts).                        Genitals (private parts) with your normal soap.              6.  Wash thoroughly, paying special attention to the area where your surgery  will be performed.   7.  Thoroughly rinse your body with warm water from the neck  down.   8.  DO NOT shower/wash with your normal soap after using and rinsing off  the CHG Soap .                9.  Pat yourself  dry with a clean towel.             10.  Wear clean pajamas.             11.  Place clean sheets on your bed the night of your first shower and do not  sleep with pets.  Day of Surgery : Do not apply any lotions/deodorants the morning of surgery.  Please wear clean clothes to the hospital/surgery center.  FAILURE TO FOLLOW THESE INSTRUCTIONS MAY RESULT IN THE CANCELLATION OF YOUR SURGERY    PATIENT SIGNATURE_________________________________  ______________________________________________________________________

## 2015-01-27 ENCOUNTER — Encounter (HOSPITAL_COMMUNITY)
Admission: RE | Admit: 2015-01-27 | Discharge: 2015-01-27 | Disposition: A | Discharge status: Home / Self Care | Payer: 59 | Source: Ambulatory Visit | Attending: Urology | Admitting: Urology

## 2015-01-27 ENCOUNTER — Encounter (HOSPITAL_COMMUNITY): Payer: Self-pay

## 2015-01-27 DIAGNOSIS — Z01810 Encounter for preprocedural cardiovascular examination: Secondary | ICD-10-CM | POA: Insufficient documentation

## 2015-01-27 DIAGNOSIS — Z01812 Encounter for preprocedural laboratory examination: Secondary | ICD-10-CM | POA: Diagnosis not present

## 2015-01-27 HISTORY — DX: Calculus of kidney: N20.0

## 2015-01-27 HISTORY — DX: Carpal tunnel syndrome, bilateral upper limbs: G56.03

## 2015-01-27 NOTE — Progress Notes (Signed)
   01/27/15 0920  OBSTRUCTIVE SLEEP APNEA  Have you ever been diagnosed with sleep apnea through a sleep study? No  Do you snore loudly (loud enough to be heard through closed doors)?  1  Do you often feel tired, fatigued, or sleepy during the daytime? 1  Has anyone observed you stop breathing during your sleep? 1  Do you have, or are you being treated for high blood pressure? 1  BMI more than 35 kg/m2? 0  Age over 50 years old? 0  Neck circumference greater than 40 cm/16 inches? 1  Gender: 1

## 2015-01-28 ENCOUNTER — Other Ambulatory Visit: Payer: Self-pay | Admitting: Urology

## 2015-01-31 NOTE — Progress Notes (Signed)
Labs in computer-[facility] on arrival at 1015.

## 2015-01-31 NOTE — Progress Notes (Signed)
Called to verify he had been contacted to arrive at [facility] [address] at 1015 for procedure since moved last week.Confirmed he was aware-instructions on insulin had been reviewed also with dosages.

## 2015-02-01 ENCOUNTER — Encounter (HOSPITAL_BASED_OUTPATIENT_CLINIC_OR_DEPARTMENT_OTHER): Payer: Self-pay | Admitting: *Deleted

## 2015-02-01 ENCOUNTER — Ambulatory Visit (HOSPITAL_BASED_OUTPATIENT_CLINIC_OR_DEPARTMENT_OTHER)
Admission: RE | Admit: 2015-02-01 | Discharge: 2015-02-01 | Disposition: A | Discharge status: Home / Self Care | Payer: 59 | Source: Ambulatory Visit | Attending: Urology | Admitting: Urology

## 2015-02-01 ENCOUNTER — Ambulatory Visit (HOSPITAL_BASED_OUTPATIENT_CLINIC_OR_DEPARTMENT_OTHER): Payer: 59 | Admitting: Anesthesiology

## 2015-02-01 ENCOUNTER — Encounter (HOSPITAL_BASED_OUTPATIENT_CLINIC_OR_DEPARTMENT_OTHER)
Admission: RE | Disposition: A | Discharge status: Home / Self Care | Payer: Self-pay | Source: Ambulatory Visit | Attending: Urology

## 2015-02-01 DIAGNOSIS — I10 Essential (primary) hypertension: Secondary | ICD-10-CM | POA: Diagnosis not present

## 2015-02-01 DIAGNOSIS — E119 Type 2 diabetes mellitus without complications: Secondary | ICD-10-CM | POA: Diagnosis not present

## 2015-02-01 DIAGNOSIS — N201 Calculus of ureter: Secondary | ICD-10-CM | POA: Diagnosis not present

## 2015-02-01 DIAGNOSIS — Z794 Long term (current) use of insulin: Secondary | ICD-10-CM | POA: Diagnosis not present

## 2015-02-01 DIAGNOSIS — E559 Vitamin D deficiency, unspecified: Secondary | ICD-10-CM | POA: Insufficient documentation

## 2015-02-01 DIAGNOSIS — F1099 Alcohol use, unspecified with unspecified alcohol-induced disorder: Secondary | ICD-10-CM | POA: Insufficient documentation

## 2015-02-01 DIAGNOSIS — E785 Hyperlipidemia, unspecified: Secondary | ICD-10-CM | POA: Diagnosis not present

## 2015-02-01 DIAGNOSIS — M109 Gout, unspecified: Secondary | ICD-10-CM | POA: Insufficient documentation

## 2015-02-01 DIAGNOSIS — F17210 Nicotine dependence, cigarettes, uncomplicated: Secondary | ICD-10-CM | POA: Diagnosis not present

## 2015-02-01 HISTORY — PX: CYSTOSCOPY WITH RETROGRADE PYELOGRAM, URETEROSCOPY AND STENT PLACEMENT: SHX5789

## 2015-02-01 HISTORY — PX: HOLMIUM LASER APPLICATION: SHX5852

## 2015-02-01 LAB — POCT I-STAT, CHEM 8
BUN: 16 mg/dL (ref 6–23)
CREATININE: 1.1 mg/dL (ref 0.50–1.35)
Calcium, Ion: 1.23 mmol/L (ref 1.12–1.23)
Chloride: 100 mmol/L (ref 96–112)
GLUCOSE: 151 mg/dL — AB (ref 70–99)
HEMATOCRIT: 49 % (ref 39.0–52.0)
HEMOGLOBIN: 16.7 g/dL (ref 13.0–17.0)
POTASSIUM: 4 mmol/L (ref 3.5–5.1)
SODIUM: 138 mmol/L (ref 135–145)
TCO2: 23 mmol/L (ref 0–100)

## 2015-02-01 LAB — GLUCOSE, CAPILLARY: GLUCOSE-CAPILLARY: 135 mg/dL — AB (ref 70–99)

## 2015-02-01 SURGERY — CYSTOURETEROSCOPY, WITH RETROGRADE PYELOGRAM AND STENT INSERTION
Anesthesia: General | Laterality: Right

## 2015-02-01 SURGERY — CYSTOURETEROSCOPY, WITH RETROGRADE PYELOGRAM AND STENT INSERTION
Anesthesia: General | Site: Ureter | Laterality: Right

## 2015-02-01 MED ORDER — ONDANSETRON HCL 4 MG/2ML IJ SOLN
INTRAMUSCULAR | Status: DC | PRN
  Administered 2015-02-01: 4 mg via INTRAVENOUS

## 2015-02-01 MED ORDER — OXYCODONE-ACETAMINOPHEN 10-325 MG PO TABS: 1.0000 | ORAL_TABLET | ORAL | Status: DC | PRN

## 2015-02-01 MED ORDER — PROPOFOL 10 MG/ML IV BOLUS
INTRAVENOUS | Status: DC | PRN
  Administered 2015-02-01: 200 mg via INTRAVENOUS

## 2015-02-01 MED ORDER — KETOROLAC TROMETHAMINE 30 MG/ML IJ SOLN
INTRAMUSCULAR | Status: DC | PRN
  Administered 2015-02-01: 30 mg via INTRAVENOUS

## 2015-02-01 MED ORDER — CEPHALEXIN 500 MG PO CAPS: 500.0000 mg | ORAL_CAPSULE | Freq: Two times a day (BID) | ORAL | Status: DC

## 2015-02-01 MED ORDER — METOCLOPRAMIDE HCL 5 MG/ML IJ SOLN
INTRAMUSCULAR | Status: DC | PRN
  Administered 2015-02-01: 10 mg via INTRAVENOUS

## 2015-02-01 MED ORDER — GENTAMICIN SULFATE 40 MG/ML IJ SOLN
440.0000 mg | Freq: Once | INTRAVENOUS | Status: AC
  Administered 2015-02-01: 440 mg via INTRAVENOUS
  Filled 2015-02-01: qty 11

## 2015-02-01 MED ORDER — MIDAZOLAM HCL 2 MG/2ML IJ SOLN
INTRAMUSCULAR | Status: AC
  Filled 2015-02-01: qty 2

## 2015-02-01 MED ORDER — SENNOSIDES-DOCUSATE SODIUM 8.6-50 MG PO TABS: 1.0000 | ORAL_TABLET | Freq: Two times a day (BID) | ORAL | Status: DC

## 2015-02-01 MED ORDER — STERILE WATER FOR IRRIGATION IR SOLN
Status: DC | PRN
  Administered 2015-02-01: 500 mL

## 2015-02-01 MED ORDER — SODIUM CHLORIDE 0.9 % IR SOLN
Status: DC | PRN
Start: 2015-02-01 — End: 2015-02-01
  Administered 2015-02-01: 4000 mL

## 2015-02-01 MED ORDER — FENTANYL CITRATE 0.05 MG/ML IJ SOLN
INTRAMUSCULAR | Status: AC
  Filled 2015-02-01: qty 4

## 2015-02-01 MED ORDER — LACTATED RINGERS IV SOLN
INTRAVENOUS | Status: DC
  Administered 2015-02-01 (×2): via INTRAVENOUS
  Filled 2015-02-01: qty 1000

## 2015-02-01 MED ORDER — ACETAMINOPHEN 10 MG/ML IV SOLN
INTRAVENOUS | Status: DC | PRN
  Administered 2015-02-01: 1000 mg via INTRAVENOUS

## 2015-02-01 MED ORDER — GENTAMICIN IN SALINE 1.6-0.9 MG/ML-% IV SOLN
80.0000 mg | INTRAVENOUS | Status: DC
  Filled 2015-02-01: qty 50

## 2015-02-01 MED ORDER — LIDOCAINE HCL (CARDIAC) 20 MG/ML IV SOLN
INTRAVENOUS | Status: DC | PRN
  Administered 2015-02-01: 100 mg via INTRAVENOUS

## 2015-02-01 MED ORDER — DEXAMETHASONE SODIUM PHOSPHATE 4 MG/ML IJ SOLN
INTRAMUSCULAR | Status: DC | PRN
  Administered 2015-02-01: 10 mg via INTRAVENOUS

## 2015-02-01 MED ORDER — IOHEXOL 350 MG/ML SOLN
INTRAVENOUS | Status: DC | PRN
Start: 2015-02-01 — End: 2015-02-01
  Administered 2015-02-01: 20 mL

## 2015-02-01 MED ORDER — MIDAZOLAM HCL 5 MG/5ML IJ SOLN
INTRAMUSCULAR | Status: DC | PRN
  Administered 2015-02-01: 2 mg via INTRAVENOUS

## 2015-02-01 MED ORDER — FENTANYL CITRATE 0.05 MG/ML IJ SOLN
INTRAMUSCULAR | Status: DC | PRN
  Administered 2015-02-01 (×4): 50 ug via INTRAVENOUS

## 2015-02-01 SURGICAL SUPPLY — 24 items
BAG DRAIN URO-CYSTO SKYTR STRL (DRAIN) ×2 IMPLANT
BASKET LASER NITINOL 1.9FR (BASKET) ×4 IMPLANT
CANISTER SUCT LVC 12 LTR MEDI- (MISCELLANEOUS) ×2 IMPLANT
CATH INTERMIT  6FR 70CM (CATHETERS) ×2 IMPLANT
CLOTH BEACON ORANGE TIMEOUT ST (SAFETY) ×2 IMPLANT
FIBER LASER FLEXIVA 200 (UROLOGICAL SUPPLIES) ×2 IMPLANT
GLOVE BIO SURGEON STRL SZ 6.5 (GLOVE) ×2 IMPLANT
GLOVE BIO SURGEON STRL SZ7.5 (GLOVE) ×2 IMPLANT
GLOVE BIOGEL PI IND STRL 6.5 (GLOVE) ×2 IMPLANT
GLOVE BIOGEL PI INDICATOR 6.5 (GLOVE) ×2
GOWN STRL REUS W/ TWL LRG LVL3 (GOWN DISPOSABLE) ×1 IMPLANT
GOWN STRL REUS W/TWL LRG LVL3 (GOWN DISPOSABLE) ×1
GOWN STRL REUS W/TWL XL LVL3 (GOWN DISPOSABLE) ×2 IMPLANT
GUIDEWIRE ANG ZIPWIRE 038X150 (WIRE) ×2 IMPLANT
GUIDEWIRE STR DUAL SENSOR (WIRE) ×2 IMPLANT
IV NS 1000ML (IV SOLUTION) ×2
IV NS 1000ML BAXH (IV SOLUTION) ×2 IMPLANT
IV NS IRRIG 3000ML ARTHROMATIC (IV SOLUTION) ×2 IMPLANT
PACK CYSTO (CUSTOM PROCEDURE TRAY) ×2 IMPLANT
SHEATH ACCESS URETERAL 38CM (SHEATH) ×2 IMPLANT
STENT POLARIS 5FRX26 (STENTS) ×2 IMPLANT
SYRINGE 10CC LL (SYRINGE) ×2 IMPLANT
TUBE FEEDING 8FR 16IN STR KANG (MISCELLANEOUS) ×2 IMPLANT
WATER STERILE IRR 500ML POUR (IV SOLUTION) ×2 IMPLANT

## 2015-02-01 NOTE — Anesthesia Procedure Notes (Signed)
Procedure Name: LMA Insertion Date/Time: 02/01/2015 12:12 PM Performed by: Mechele Claude Pre-anesthesia Checklist: Patient identified, Emergency Drugs available, Suction available and Patient being monitored Patient Re-evaluated:Patient Re-evaluated prior to inductionOxygen Delivery Method: Circle System Utilized Preoxygenation: Pre-oxygenation with 100% oxygen Intubation Type: IV induction Ventilation: Mask ventilation without difficulty LMA: LMA inserted LMA Size: 5.0 Number of attempts: 1 Airway Equipment and Method: bite block Placement Confirmation: positive ETCO2 Tube secured with: Tape Dental Injury: Teeth and Oropharynx as per pre-operative assessment

## 2015-02-01 NOTE — H&P (Signed)
Arthur Hudson is an 49 y.o. male.    Chief Complaint: Pre-OP Right Ureteroscopic Stone Manipulation  HPI:    1- Nephrolithiasis - 61mm Rt UPJ stone with mod hydro by ER CT 01/20/15 on eval rt flnak pain (14cm SSD, 450 HU, L3-4 interspace) also Rt lower pole 36mm non-obstructing and bilateral punctate stones.   PMH sig for IDDM2, Gout, HTN. His PCP is Dr. Melford Aase  Today "Arthur Hudson " is seen to proceed with right ureteroscopic stone manipulation. NO interval fevers.    Past Medical History  Diagnosis Date  . Hypertension   . Hyperlipidemia   . Diabetes mellitus type 2, insulin dependent   . Fatty liver disease, nonalcoholic   . Gout   . Vitamin D deficiency   . Carpal tunnel syndrome on both sides   . Kidney stone     No past surgical history on file.  Family History  Problem Relation Age of Onset  . Hypertension Mother   . Diabetes Mother   . Asthma Mother   . Diabetes Father   . Hypertension Father   . Stroke Father   . Hypertension Sister   . Hyperlipidemia Sister    Social History:  reports that he has been smoking Cigars.  He has never used smokeless tobacco. He reports that he drinks alcohol. He reports that he does not use illicit drugs.  Allergies:  Allergies  Allergen Reactions  . 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 Welchol [Colesevelam Hcl]     Chest pain    No prescriptions prior to admission    No results found for this or any previous visit (from the past 48 hour(s)). No results found.  Review of Systems  Constitutional: Negative.  Negative for fever and chills.  HENT: Negative.   Eyes: Negative.   Respiratory: Negative.   Cardiovascular: Negative.   Gastrointestinal: Negative.   Genitourinary: Positive for flank pain.  Musculoskeletal: Negative.   Skin: Negative.   Neurological: Negative.   Endo/Heme/Allergies: Negative.   Psychiatric/Behavioral: Negative.     There were no vitals taken for this  visit. Physical Exam  Constitutional: He appears well-developed.  HENT:  Head: Normocephalic.  Eyes: Pupils are equal, round, and reactive to light.  Neck: Normal range of motion.  Cardiovascular: Normal rate.   Respiratory: Effort normal.  GI: Soft.  Genitourinary: Penis normal.  Mild Rt CVAT  Musculoskeletal: Normal range of motion.  Neurological: He is alert.  Skin: Skin is warm.  Psychiatric: He has a normal mood and affect. His behavior is normal. Judgment and thought content normal.     Assessment/Plan  1 - Nephrolithiasis -  We rediscussed ureteroscopic stone manipulation with basketing and laser-lithotripsy in detail.  We rediscussed risks including bleeding, infection, damage to kidney / ureter  bladder, rarely loss of kidney. We rediscussed anesthetic risks and rare but serious surgical complications including DVT, PE, MI, and mortality. We specifically readdressed that in 5-10% of cases a staged approach is required with stenting followed by re-attempt ureteroscopy if anatomy unfavorable.   The patient voiced understanding and wises to proceed today as planned.   Kagan Mutchler 02/01/2015, 6:21 AM

## 2015-02-01 NOTE — Brief Op Note (Signed)
02/01/2015  1:19 PM  PATIENT:  Arthur Hudson  49 y.o. male  PRE-OPERATIVE DIAGNOSIS:  RIGHT URETERAL STONE  POST-OPERATIVE DIAGNOSIS:  RIGHT URETERAL STONE  PROCEDURE:  Procedure(s): CYSTOSCOPY WITH RETROGRADE PYELOGRAM, URETEROSCOPY, DIGITAL URETEROSCOPY with STONE BASKETRY  AND STENT PLACEMENT (Right) HOLMIUM LASER APPLICATION (Right)  SURGEON:  Surgeon(s) and Role:    * Alexis Frock, MD - Primary  PHYSICIAN ASSISTANT:   ASSISTANTS: none   ANESTHESIA:   general  EBL:  Total I/O In: 1000 [I.V.:1000] Out: -   BLOOD ADMINISTERED:none  DRAINS: none   LOCAL MEDICATIONS USED:  NONE  SPECIMEN:  Source of Specimen:  Rt ureteral and renal stone fragments  DISPOSITION OF SPECIMEN:  [facility] for compositional analysis  COUNTS:  YES  TOURNIQUET:  * No tourniquets in log *  DICTATION: .Other Dictation: Dictation Number [id number]  PLAN OF CARE: Discharge to home after PACU  PATIENT DISPOSITION:  PACU - hemodynamically stable.   Delay start of Pharmacological VTE agent (>24hrs) due to surgical blood loss or risk of bleeding: yes

## 2015-02-01 NOTE — Discharge Instructions (Signed)
1 - You may have urinary urgency (bladder spasms) and bloody urine on / off with stent in place. This is normal.  2 - Call MD or go to ER for fever >102, severe pain / nausea / vomiting not relieved by medications, or acute change in medical status  3 -[facility] [phone] Post Ureteroscopy With or Without Stent Instructions  Definitions:  Ureter: The duct that transports urine from the kidney to the bladder. Stent:   A plastic hollow tube that is placed into the ureter, from the kidney to the                 bladder to prevent the ureter from swelling shut.  GENERAL INSTRUCTIONS:  Despite the fact that no skin incisions were used, the area around the ureter and bladder is raw and irritated. The stent is a foreign body which will further irritate the bladder wall. This irritation is manifested by increased frequency of urination, both day and night, and by an increase in the urge to urinate. In some, the urge to urinate is present almost always. Sometimes the urge is strong enough that you may not be able to stop yourself from urinating. The only real cure is to remove the stent and then give time for the bladder wall to heal which can't be done until the danger of the ureter swelling shut has passed, which varies.  You may see some blood in your urine while the stent is in place and a few days afterwards. Do not be alarmed, even if the urine was clear for a while. Get off your feet and drink lots of fluids until clearing occurs. If you start to pass clots or don't improve, call us.  DIET: You may return to your normal diet immediately. Because of the raw surface of your bladder, alcohol, spicy foods, acid type foods and drinks with caffeine may cause irritation or frequency and should be used in moderation. To keep your urine flowing freely and to avoid constipation, drink plenty of fluids during the day ( 8-10 glasses ). Tip: Avoid cranberry juice because it is very  acidic.  ACTIVITY: Your physical activity doesn't need to be restricted. However, if you are very active, you may see some blood in your urine. We suggest that you reduce your activity under these circumstances until the bleeding has stopped.  BOWELS: It is important to keep your bowels regular during the postoperative period. Straining with bowel movements can cause bleeding. A bowel movement every other day is reasonable. Use a mild laxative if needed, such as Milk of Magnesia 2-3 tablespoons, or 2 Dulcolax tablets. Call if you continue to have problems. If you have been taking narcotics for pain, before, during or after your surgery, you may be constipated. Take a laxative if necessary.   MEDICATION: You should resume your pre-surgery medications unless told not to. In addition you will often be given an antibiotic to prevent infection. These should be taken as prescribed until the bottles are finished unless you are having an unusual reaction to one of the drugs.  PROBLEMS YOU SHOULD REPORT TO [place]:  Fevers over 100.5 Fahrenheit.  Heavy bleeding, or clots ( See above notes about blood in urine ).  Inability to urinate.  Drug reactions ( hives, rash, nausea, vomiting, diarrhea ).  Severe burning or pain with urination that is not improving.  FOLLOW-UP: You will need a follow-up appointment to monitor your progress. Call for this appointment at the number  Usually the first appointment will be about three to fourteen days after your surgery. ° ° ° ° ° °Post Anesthesia Home Care Instructions ° °Activity: °Get plenty of rest for the remainder of the day. A responsible adult should stay with you for 24 hours following the procedure.  °For the next 24 hours, DO NOT: °-Drive a car °-Operate machinery °-Drink alcoholic beverages °-Take any medication unless instructed by your physician °-Make any legal decisions or sign important papers. ° °Meals: °Start with liquid foods such as  gelatin or soup. Progress to regular foods as tolerated. Avoid greasy, spicy, heavy foods. If nausea and/or vomiting occur, drink only clear liquids until the nausea and/or vomiting subsides. Call your physician if vomiting continues. ° °Special Instructions/Symptoms: °Your throat may feel dry or sore from the anesthesia or the breathing tube placed in your throat during surgery. If this causes discomfort, gargle with warm salt water. The discomfort should disappear within 24 hours. ° °

## 2015-02-01 NOTE — Transfer of Care (Signed)
  Filed Vitals:   02/01/15 1020  BP: 128/75  Pulse: 68  Temp: 37 C  Resp: 16  Immediate Anesthesia Transfer of Care Note  Patient: Arthur Hudson  Procedure(s) Performed: Procedure(s) (LRB): CYSTOSCOPY WITH RETROGRADE PYELOGRAM, URETEROSCOPY, DIGITAL URETEROSCOPY with STONE BASKETRY  AND STENT PLACEMENT (Right) HOLMIUM LASER APPLICATION (Right)  Patient Location: PACU  Anesthesia Type: General  Level of Consciousness: awake, alert  and oriented  Airway & Oxygen Therapy: Patient Spontanous Breathing and Patient connected to face mask oxygen  Post-op Assessment: Report given to PACU RN and Post -op Vital signs reviewed and stable  Post vital signs: Reviewed and stable  Complications: No apparent anesthesia complications

## 2015-02-01 NOTE — Anesthesia Preprocedure Evaluation (Addendum)
Anesthesia Evaluation  Patient identified by MRN, date of birth, ID band Patient awake    Reviewed: Allergy & Precautions, NPO status , Patient's Chart, lab work & pertinent test results  Airway Mallampati: II  TM Distance: >3 FB Neck ROM: Full    Dental no notable dental hx.    Pulmonary Current Smoker,  breath sounds clear to auscultation  Pulmonary exam normal       Cardiovascular hypertension, Pt. on medications and Pt. on home beta blockers negative cardio ROS  Rhythm:Regular Rate:Normal     Neuro/Psych  Neuromuscular disease negative psych ROS   GI/Hepatic negative GI ROS, Neg liver ROS,   Endo/Other  diabetes, Type 2, Oral Hypoglycemic Agents, Insulin Dependent  Renal/GU Renal disease     Musculoskeletal negative musculoskeletal ROS (+)   Abdominal   Peds  Hematology negative hematology ROS (+)   Anesthesia Other Findings   Reproductive/Obstetrics                           Anesthesia Physical Anesthesia Plan  ASA: II  Anesthesia Plan: General   Post-op Pain Management:    Induction: Intravenous  Airway Management Planned: LMA  Additional Equipment:   Intra-op Plan:   Post-operative Plan: Extubation in OR  Informed Consent: I have reviewed the patients History and Physical, chart, labs and discussed the procedure including the risks, benefits and alternatives for the proposed anesthesia with the patient or authorized representative who has indicated his/her understanding and acceptance.   Dental advisory given  Plan Discussed with: CRNA  Anesthesia Plan Comments:         Anesthesia Quick Evaluation

## 2015-02-02 ENCOUNTER — Encounter (HOSPITAL_BASED_OUTPATIENT_CLINIC_OR_DEPARTMENT_OTHER): Payer: Self-pay | Admitting: Urology

## 2015-02-02 NOTE — Op Note (Signed)
NAME:  Arthur Hudson, Arthur Hudson NO.:  [id number]  MEDICAL RECORD NO.:  [id number]  LOCATION:                                 FACILITY:  PHYSICIAN:  Alexis Frock, MD     DATE OF BIRTH:  July 24, 1966  DATE OF PROCEDURE:  02/01/2015 DATE OF DISCHARGE:  02/01/2015                              OPERATIVE REPORT   DIAGNOSIS:  Large right ureteral and renal stones, right renal colic.  PROCEDURE: 1. Cystoscopy with right retrograde pyelogram interpretation. 2. Right ureteroscopy with laser lithotripsy. 3. Insertion of right ureteral stent, 5 x 26 Polaris with tether to     dorsum of penis.  ESTIMATED BLOOD LOSS:  Nil.  COMPLICATIONS:  None.  SPECIMEN:  Right ureteral and renal stone fragments for compositional analysis.  FINDINGS: 1. Unremarkable urinary bladder. 2. Filling defect in the right mid ureter consistent with known stone. 3. Retrograde pyelogram with moderate hydroureteronephrosis above. 4. Large right ureteral stone in the mid ureter corresponding to     filling defect and retrograde pyelogram. 5. Large right lower pole renal stone. 6. Smaller papillary tip calcifications in the right kidney. 7. Complete resolution of all stone fragments larger than 1/3rd mm     following laser lithotripsy and stone removal.  INDICATION:  Arthur Hudson was a pleasant 49 year old gentleman with a history of recent right flank pain.  He was found on workup of this to have relatively large right midureteral stone as well as multifocal right renal stones including a somewhat larger right lower pole stone. He was referred for consideration of management.  Options were discussed including medical therapy versus shockwave lithotripsy versus ureteroscopy, and he adamantly wished to proceed with the latter with goal of ipsilateral stone free.  Informed consent was obtained and placed in medical record.  PROCEDURE IN DETAIL:  The patient being Arthur Hudson, was verified. Procedure  being right ureteroscopic stone manipulation was confirmed. Procedure was carried out.  Time-out was performed.  Intravenous antibiotics were administered.  General LMA anesthesia was introduced, and the patient was placed into a low lithotomy position.  Sterile field was created by prepping and draping the patient's penis perineum and proximal thighs using iodine x3.  Next, cystourethroscopy was performed using a 23-French rigid cystoscope with 30-degree offset lens. Inspection of the anterior and posterior urethra were unremarkable. Inspection of urinary bladder revealed no diverticula,  calcifications, or papular lesions.  The right ureteral orifice was cannulated with a 6- French end-hole catheter and right retrograde pyelogram was obtained.  Right retrograde pyelogram demonstrated a single right ureter, single system right kidney.  There was a significant filling defect in the mid ureter consistent with known stone.  There was moderate hydroureteronephrosis above this.  A 0.038 zip wire was advanced at level of the upper pole and set aside as a safety wire.  An 8-French feeding tube was placed in urinary bladder for pressure release.  Next, semi-rigid ureteroscopy was performed in the distal half of the right ureter alongside a separate Sensor working wire.  As expected, the stone in question was encountered and it appeared to be quite obstructing.  It was in the mid ureter and this  not appeared to be much too large for simple basketing.  As such, holmium laser energy was applied to the stone using settings of 0.3 joules and 20 Hz fragmenting the stone into pieces, approximately 2 mm in size or less.  These were then sequentially basketed and set in the urinary bladder.  Larger dominant fragment was positioned retrograde up towards the kidney and the distal 3/4th of the ureter was inspected with a semi-rigid scope and no additional calcifications or mucosal abnormalities were found.   As the goal today was stone free, the semi-rigid ureteroscope was exchanged with a 12/14, 38-cm ureteral access sheath over a Sensor working wire using fluoroscopic guidance at the level of proximal ureter.  Next, flexible digital ureteroscopy was performed.  Following inspection of the proximal ureter and panendoscopic examination of all calices of the right kidney times several multifocal stones were encountered including a large lower pole stone.  The previous ureteral stone fragments that had been repositioned retrograde in a smaller upper pole conglomerate papillary tip calcifications.  All of these appeared to be too large for simple basketing.  Holmium laser energy was applied to the stone again using settings of 0.3 joules and 20 Hz  fragmenting these pieces into smaller pieces, approximately 2 mm or so.  These were then grasped sequentially with an escape basket and brought out in their entirety, set aside for compositional analysis.  Following these maneuvers, there was complete resolution of all stone fragments larger than 1/3rd mm. There was excellent hemostasis.  There was no evidence of renal perforation.  Given the use of the access sheath, it was felt that ureteral stenting would likely be warranted.  The sheath was removed under continuous ureteroscopic vision, no mucosal abnormalities were found in the entire length of the right ureter.  Finally, a new 5 x 26 Polaris type stent was placed with remaining safety wire using fluoroscopic guidance.  Good proximal and distal deployment were noted. Tether was left in place and fashioned in the dorsum of the penis.  The small feeding tube with pressure release was removed and the procedure was terminated.  The patient obviously tolerated the procedure well. There were no immediate periprocedural complications.  The patient was taken to postanesthesia care unit in stable condition.           ______________________________ Alexis Frock, MD     TM/MEDQ  D:  02/01/2015  T:  02/02/2015  Job:  [id number]

## 2015-02-03 NOTE — Anesthesia Postprocedure Evaluation (Signed)
Anesthesia Post Note  Patient: Arthur Hudson  Procedure(s) Performed: Procedure(s) (LRB): CYSTOSCOPY WITH RETROGRADE PYELOGRAM, URETEROSCOPY, DIGITAL URETEROSCOPY with STONE BASKETRY  AND STENT PLACEMENT (Right) HOLMIUM LASER APPLICATION (Right)  Anesthesia type: General  Patient location: PACU  Post pain: Pain level controlled  Post assessment: Post-op Vital signs reviewed  Last Vitals: BP 124/73 mmHg  Pulse 61  Temp(Src) 36.6 C (Oral)  Resp 16  Ht 6\' 1"  (1.854 m)  Wt 246 lb 8 oz (111.812 kg)  BMI 32.53 kg/m2  SpO2 95%  Post vital signs: Reviewed  Level of consciousness: sedated  Complications: No apparent anesthesia complications

## 2015-02-20 ENCOUNTER — Encounter: Payer: Self-pay | Admitting: *Deleted

## 2015-03-13 IMAGING — CT CT RENAL STONE PROTOCOL
2 of 4 series · 4 of 46 positions shown, 5 images · non-contrast
Comparison: CT of the abdomen and pelvis 11/27/2011.

CLINICAL DATA: 48-year-old male with right-sided abdominal pain and
hematemesis.

EXAM:
CT ABDOMEN AND PELVIS WITHOUT CONTRAST
TECHNIQUE: Multidetector CT imaging of the abdomen and pelvis was performed
following the standard protocol without IV contrast.

[Series 206: coronals · coronal · 0.50mm/px · 3 of 159 slices shown, 4 images]
[im 36/159  soft-tissue]
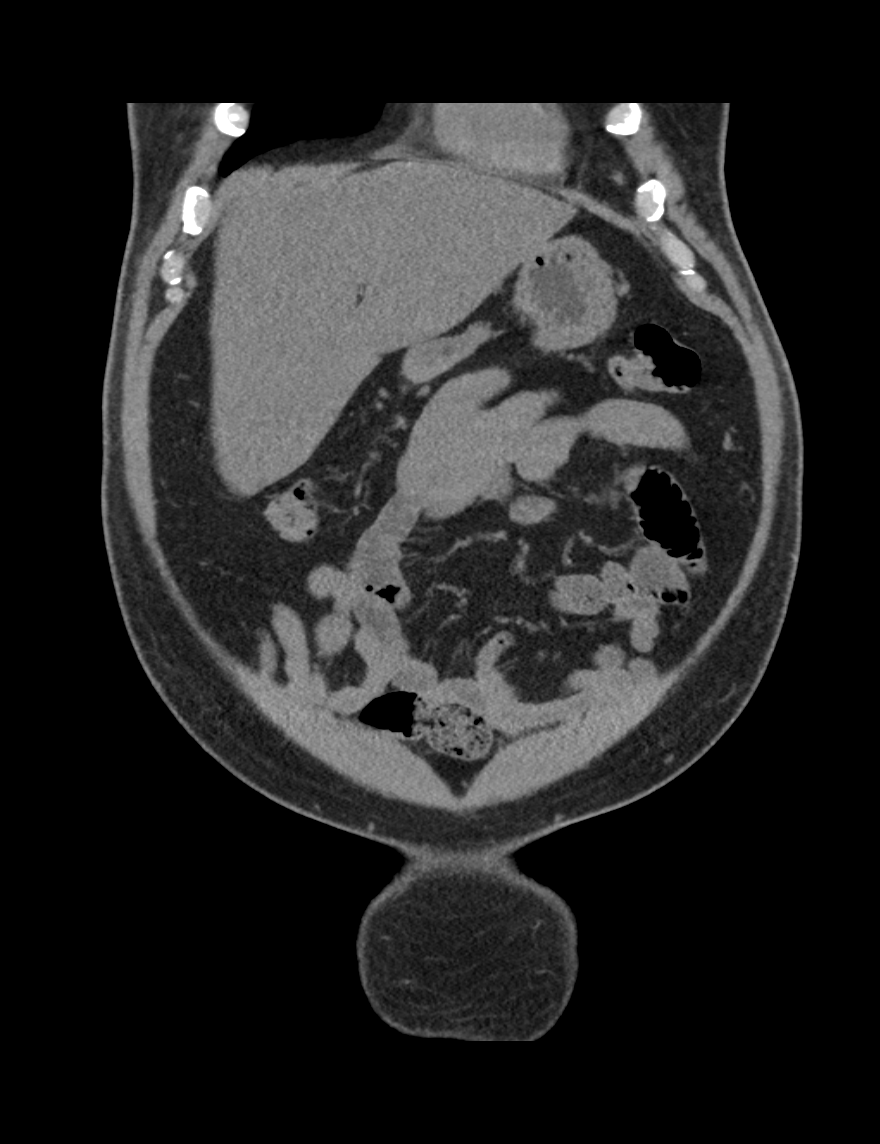
[im 36/159  bone]
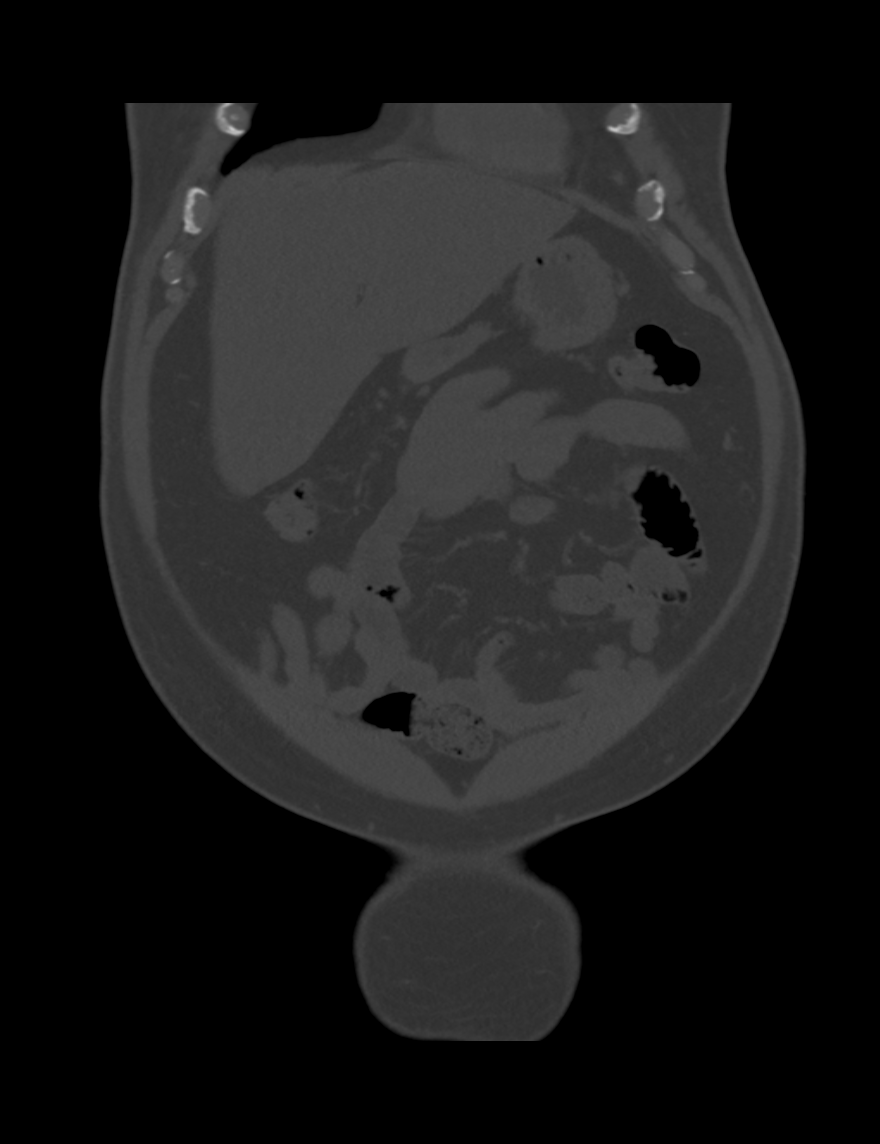
[im 88/159  soft-tissue]
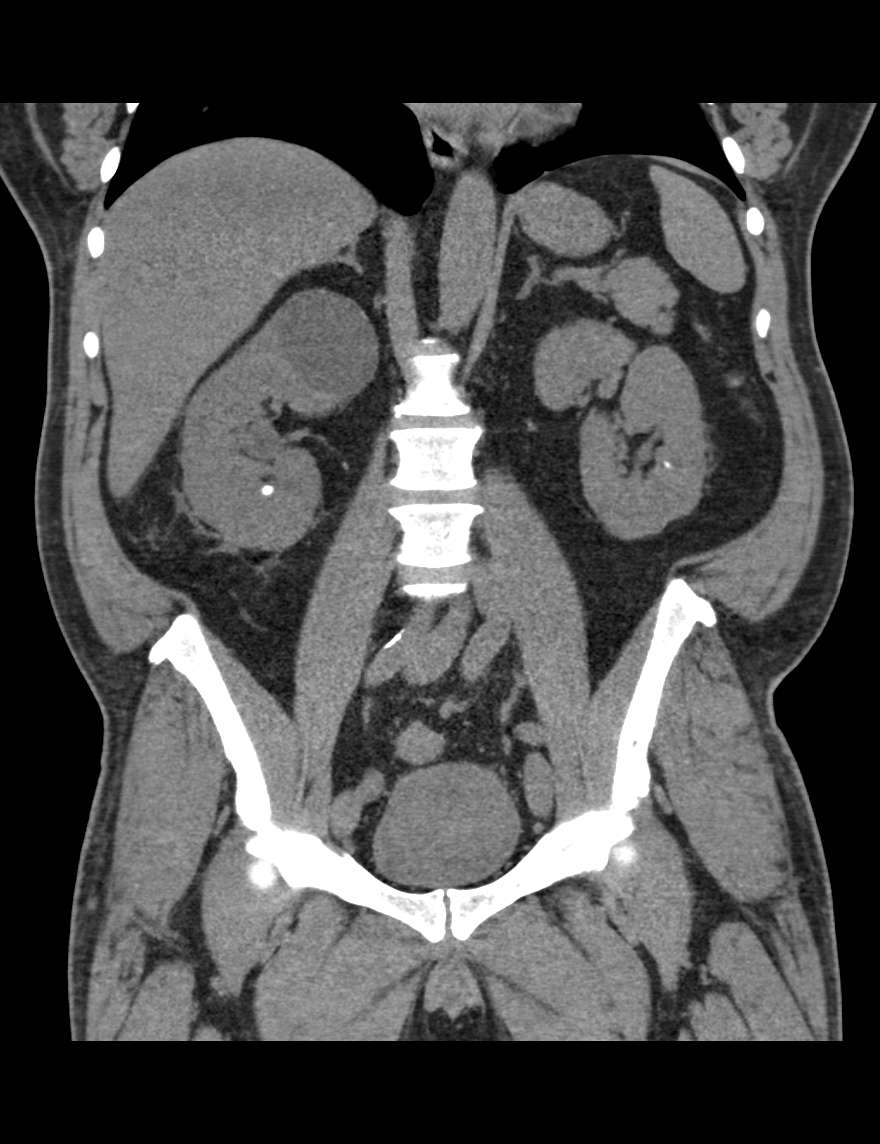
[im 123/159  soft-tissue]
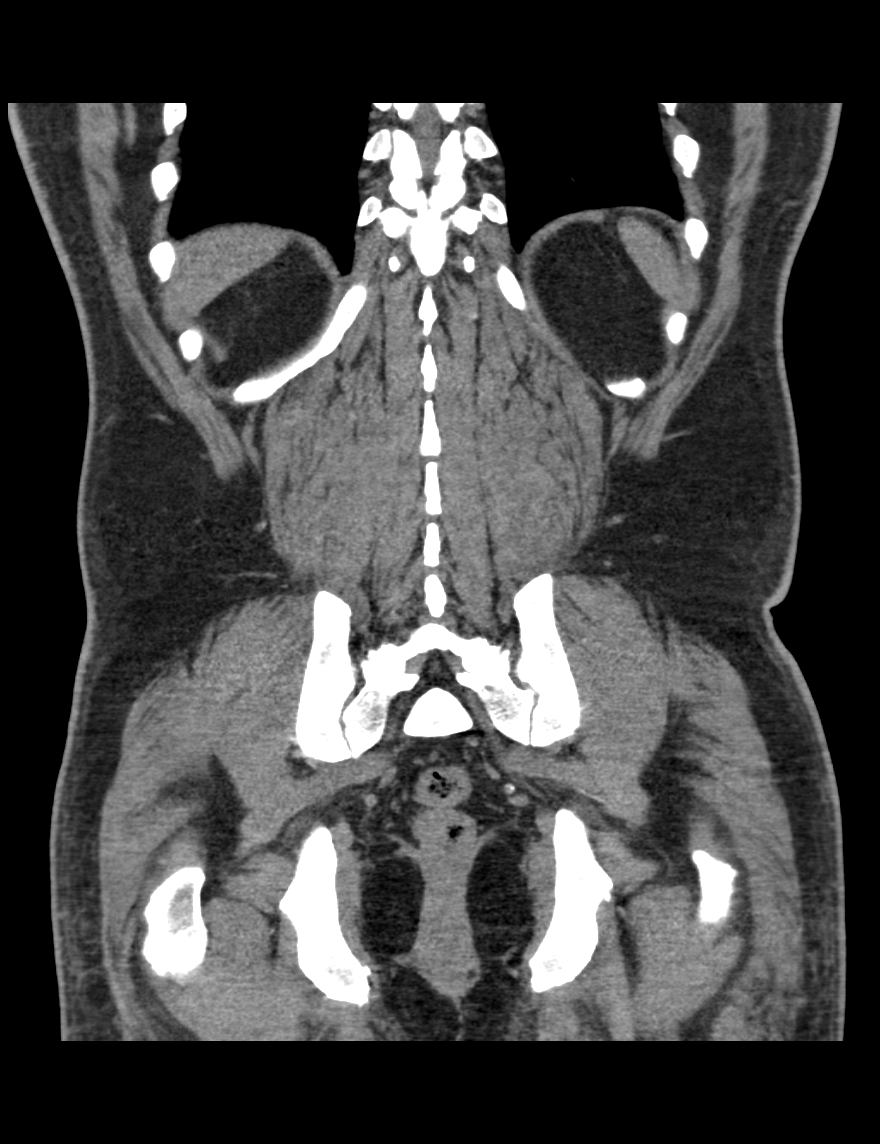

[Series 207: sagittals · sagittal · 0.50mm/px · 1 of 198 slices shown]
[im 66/198  soft-tissue]
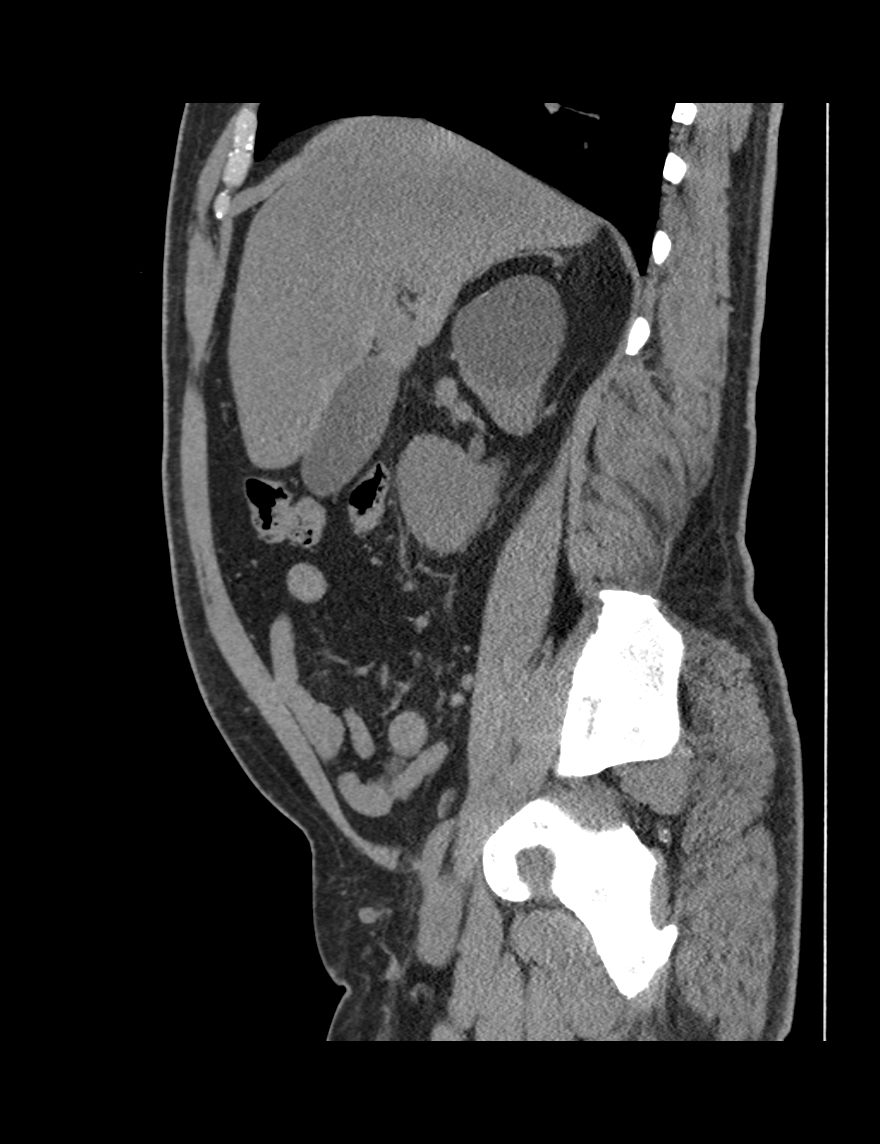

[4 of 46 positions shown; findings below may reference images not displayed]

FINDINGS: Lower chest:  Unremarkable.

Hepatobiliary: Heterogeneous areas of low attenuation are noted in
the liver, compatible with hepatic steatosis. No discrete cystic or
solid hepatic lesions. Gallbladder is unremarkable in appearance.

Pancreas: 1.4 x 0.9 cm subtle low-attenuation lesion in the
pancreatic head, unchanged in size compared to prior MRI 12/05/2011,
presumably a tiny benign lesion such as a pancreatic pseudocyst.
Otherwise, unremarkable.

Spleen: Unremarkable.

Adrenals/Urinary Tract: Numerous nonobstructive calculi are noted
within the collecting systems of the kidneys bilaterally, largest of
which is in the lower pole collecting system of the right kidney
measuring 10 mm. There is also an 8 mm calculus at the right
ureterovesicular junction (image 41 of series 201). This is
associated with mild right-sided hydronephrosis and perinephric
stranding. No additional calculi are noted along the course of
either ureter or within the lumen of the urinary bladder. 5.7 cm
exophytic low-attenuation lesion in the upper pole of the right
kidney is incompletely characterized on today's noncontrast CT
examination, but has previously been characterized as a simple cyst.
Left kidney is otherwise unremarkable in appearance on today's
noncontrast CT examination. Urinary bladder is unremarkable in
appearance. Bilateral adrenal glands are normal in appearance.

Stomach/Bowel: The unenhanced appearance of the stomach is normal.
No pathologic dilatation of small bowel or colon. Normal appendix.

Vascular/Lymphatic: Atherosclerosis throughout the abdominal and
pelvic vasculature, without evidence of aneurysm. No lymphadenopathy
noted in the abdomen or pelvis on today's non contrast CT
examination.

Reproductive: Prostate gland and seminal vesicles are unremarkable
in appearance.

Other: No significant volume of ascites.  No pneumoperitoneum.

Musculoskeletal: There are no aggressive appearing lytic or blastic
lesions noted in the visualized portions of the skeleton.
IMPRESSION: 1. 8 mm calculus at the right ureteropelvic junction with mild
proximal hydronephrosis and perinephric stranding indicating mild
obstruction at this time.
2. Multiple other nonobstructive calculi are noted within the
collecting systems of the kidneys bilaterally.
3. Normal appendix.
4. Mild hepatic steatosis.
5. Additional incidental findings, as above.

## 2015-03-29 ENCOUNTER — Other Ambulatory Visit: Payer: Self-pay | Admitting: *Deleted

## 2015-03-29 MED ORDER — ALLOPURINOL 300 MG PO TABS: 300.0000 mg | ORAL_TABLET | Freq: Every day | ORAL | Status: DC

## 2015-04-29 ENCOUNTER — Encounter: Payer: Self-pay | Admitting: Internal Medicine

## 2015-05-25 ENCOUNTER — Encounter: Payer: Self-pay | Admitting: Internal Medicine

## 2015-05-25 ENCOUNTER — Ambulatory Visit (INDEPENDENT_AMBULATORY_CARE_PROVIDER_SITE_OTHER): Payer: 59 | Admitting: Internal Medicine

## 2015-05-25 VITALS — BP 110/68 | HR 64 | Temp 97.5°F | Resp 16 | Ht 73.0 in | Wt 256.0 lb

## 2015-05-25 DIAGNOSIS — E1029 Type 1 diabetes mellitus with other diabetic kidney complication: Secondary | ICD-10-CM

## 2015-05-25 DIAGNOSIS — Z125 Encounter for screening for malignant neoplasm of prostate: Secondary | ICD-10-CM

## 2015-05-25 DIAGNOSIS — E785 Hyperlipidemia, unspecified: Secondary | ICD-10-CM

## 2015-05-25 DIAGNOSIS — Z6833 Body mass index (BMI) 33.0-33.9, adult: Secondary | ICD-10-CM

## 2015-05-25 DIAGNOSIS — I10 Essential (primary) hypertension: Secondary | ICD-10-CM

## 2015-05-25 DIAGNOSIS — M1000 Idiopathic gout, unspecified site: Secondary | ICD-10-CM

## 2015-05-25 DIAGNOSIS — E349 Endocrine disorder, unspecified: Secondary | ICD-10-CM

## 2015-05-25 DIAGNOSIS — E559 Vitamin D deficiency, unspecified: Secondary | ICD-10-CM

## 2015-05-25 DIAGNOSIS — Z79899 Other long term (current) drug therapy: Secondary | ICD-10-CM

## 2015-05-25 DIAGNOSIS — R5383 Other fatigue: Secondary | ICD-10-CM

## 2015-05-25 DIAGNOSIS — Z1212 Encounter for screening for malignant neoplasm of rectum: Secondary | ICD-10-CM

## 2015-05-25 DIAGNOSIS — Z0000 Encounter for general adult medical examination without abnormal findings: Secondary | ICD-10-CM

## 2015-05-25 LAB — CBC WITH DIFFERENTIAL/PLATELET
Basophils Absolute: 0.1 10*3/uL (ref 0.0–0.1)
Basophils Relative: 1 % (ref 0–1)
EOS ABS: 0.2 10*3/uL (ref 0.0–0.7)
Eosinophils Relative: 3 % (ref 0–5)
HEMATOCRIT: 40.6 % (ref 39.0–52.0)
Hemoglobin: 13.6 g/dL (ref 13.0–17.0)
Lymphocytes Relative: 41 % (ref 12–46)
Lymphs Abs: 3 10*3/uL (ref 0.7–4.0)
MCH: 29.8 pg (ref 26.0–34.0)
MCHC: 33.5 g/dL (ref 30.0–36.0)
MCV: 89 fL (ref 78.0–100.0)
MONOS PCT: 7 % (ref 3–12)
MPV: 10.4 fL (ref 8.6–12.4)
Monocytes Absolute: 0.5 10*3/uL (ref 0.1–1.0)
NEUTROS ABS: 3.5 10*3/uL (ref 1.7–7.7)
NEUTROS PCT: 48 % (ref 43–77)
Platelets: 263 10*3/uL (ref 150–400)
RBC: 4.56 MIL/uL (ref 4.22–5.81)
RDW: 14.1 % (ref 11.5–15.5)
WBC: 7.3 10*3/uL (ref 4.0–10.5)

## 2015-05-25 NOTE — Progress Notes (Signed)
Patient ID: Arthur Hudson, male   DOB: December 19, 1965, 49 y.o.   MRN: [id number]   Annual Comprehensive Examination  This very nice 49 y.o.male presents for complete physical.  Patient has been followed for HTN, T2_NIDDM  Prediabetes, Hyperlipidemia, and Vitamin D Deficiency. Patient was recently treated in Mar 2016 for a kidney stone.    HTN predates since Nov 2007. Patient's BP has been controlled at home.Today's BP: 110/68 mmHg. Patient denies any cardiac symptoms as chest pain, palpitations, shortness of breath, dizziness or ankle swelling.   Patient's hyperlipidemia is controlled with diet. Patient denies myalgias or other medication SE's. Last lipids were near goal -  Cholesterol 176; HDL 42; LDL 105; Trig 144 on 01/24/2015.   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 Last A1c was  8.0% on 01/24/2015.     Finally, patient has history of Vitamin D Deficiency of 23 in 2008  and last vitamin D was 56 on 01/24/2015.       Medication Sig  . allopurinol  300 MG tablet Take 1 tab daily.  Marland Kitchen aspirin 81 MG tablet Take  daily.  . benazepril20 MG tablet TAKE ONE TAB Daily  . bisoprolol-hctz [facility]) 5-6.25  Take 1 tab daily  . VITAMIN D 1000 UNITS tablet Take 1,000 Units by mouth every other day.   Marland Kitchen LEVEMIR 100 UNIT/ML inj Inject 18 Units   times daily.   . metFORMIN  1000 MG tablet Take 1,000 mg  daily with breakfast.   . MULTIVITAMIN WITH MINERALS Take 1 tab  daily.  . vitamin B-12  100 MCG tablet Take  daily.   Allergies  Allergen Reactions  . 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 Welchol [Colesevelam Hcl]     Chest pain   Past Medical History  Diagnosis Date  . Hypertension   . Hyperlipidemia   . Diabetes mellitus type 2, insulin dependent   . Fatty liver disease, nonalcoholic   . Gout   . Vitamin D  deficiency   . Carpal tunnel syndrome on both sides   . Kidney stone    Health Maintenance  Topic Date Due  . URINE MICROALBUMIN  10/26/1975  . HIV Screening  10/25/1980  . PNEUMOCOCCAL POLYSACCHARIDE VACCINE (2) 11/22/2014  . INFLUENZA VACCINE  06/13/2015  . HEMOGLOBIN A1C  07/27/2015  . FOOT EXAM  08/24/2015  . OPHTHALMOLOGY EXAM  02/14/2016  . TETANUS/TDAP  11/22/2016   Immunization History  Administered Date(s) Administered  . Pneumococcal-Unspecified 11/22/2009  . Tdap 11/22/2006   Past Surgical History  Procedure Laterality Date  . Cystoscopy with retrograde pyelogram, ureteroscopy and stent placement Right 02/01/2015    Procedure: CYSTOSCOPY WITH RETROGRADE PYELOGRAM, URETEROSCOPY, DIGITAL URETEROSCOPY with [place];  Surgeon: Alexis Frock, MD;  Location: [facility];  Service: Urology;  Laterality: Right;  . Holmium laser application Right 5/64/3329    Procedure: HOLMIUM LASER APPLICATION;  Surgeon: Alexis Frock, MD;  Location: [facility];  Service: Urology;  Laterality: Right;   Family History  Problem Relation Age of Onset  . Hypertension Mother   . Diabetes Mother   . Asthma Mother   . Diabetes Father   . Hypertension Father   . Stroke Father   . Hypertension Sister   . Hyperlipidemia Sister    History  Social History  . Marital Status: Divorced    Spouse Name: N/A  . Number of Children: N/A  . Years of Education: N/A   Occupational History  . Not on file.   Social History Main Topics  . Smoking status: Current Some Day Smoker    Types: Cigars  . Smokeless tobacco: Never Used  . Alcohol Use: Yes     Comment: social  . Drug Use: No  . Sexual Activity: Active    ROS Constitutional: Denies fever, chills, weight loss/gain, headaches, insomnia,  night sweats or change in appetite. Does c/o fatigue. Eyes: Denies redness, blurred vision, diplopia, discharge, itchy or watery eyes.  ENT:  Denies discharge, congestion, post nasal drip, epistaxis, sore throat, earache, hearing loss, dental pain, Tinnitus, Vertigo, Sinus pain or snoring.  Cardio: Denies chest pain, palpitations, irregular heartbeat, syncope, dyspnea, diaphoresis, orthopnea, PND, claudication or edema Respiratory: denies cough, dyspnea, DOE, pleurisy, hoarseness, laryngitis or wheezing.  Gastrointestinal: Denies dysphagia, heartburn, reflux, water brash, pain, cramps, nausea, vomiting, bloating, diarrhea, constipation, hematemesis, melena, hematochezia, jaundice or hemorrhoids Genitourinary: Denies dysuria, frequency, urgency, nocturia, hesitancy, discharge, hematuria or flank pain Musculoskeletal: Denies arthralgia, myalgia, stiffness, Jt. Swelling, pain, limp or strain/sprain. Denies Falls. Skin: Denies puritis, rash, hives, warts, acne, eczema or change in skin lesion Neuro: No weakness, tremor, incoordination, spasms, paresthesia or pain Psychiatric: Denies confusion, memory loss or sensory loss. Denies Depression. Endocrine: Denies change in weight, skin, hair change, nocturia, and paresthesia, diabetic polys, visual blurring or hyper / hypo glycemic episodes.  Heme/Lymph: No excessive bleeding, bruising or enlarged lymph nodes.  Physical Exam  BP 110/68  Pulse 64  Temp 97.5 F   Resp 16  Ht 6\' 1"    Wt 256 lb     BMI 33.78   General Appearance: Well nourished, in no apparent distress. Eyes: PERRLA, EOMs, conjunctiva no swelling or erythema, normal fundi and vessels. Sinuses: No frontal/maxillary tenderness ENT/Mouth: EACs patent / TMs  nl. Nares clear without erythema, swelling, mucoid exudates. Oral hygiene is good. No erythema, swelling, or exudate. Tongue normal, non-obstructing. Tonsils not swollen or erythematous. Hearing normal.  Neck: Supple, thyroid normal. No bruits, nodes or JVD. Respiratory: Respiratory effort normal.  BS equal and clear bilateral without rales, rhonci, wheezing or  stridor. Cardio: Heart sounds are normal with regular rate and rhythm and no murmurs, rubs or gallops. Peripheral pulses are normal and equal bilaterally without edema. No aortic or femoral bruits. Chest: symmetric with normal excursions and percussion.  Abdomen: Flat, soft, with bowel sounds. Nontender, no guarding, rebound, hernias, masses, or organomegaly.  Lymphatics: Non tender without lymphadenopathy.  Genitourinary: No hernias.Testes nl. DRE - prostate nl for age - smooth & firm w/o nodules. Musculoskeletal: Full ROM all peripheral extremities, joint stability, 5/5 strength, and normal gait. Skin: Warm and dry without rashes, lesions, cyanosis, clubbing or  ecchymosis.  Neuro: Cranial nerves intact, reflexes equal bilaterally. Normal muscle tone, no cerebellar symptoms. Sensation intact bilaterally to the toes by Monofilament testing. Pysch: Awake and oriented X 3 with normal affect, insight and judgment appropriate.   Assessment and Plan  1. Essential hypertension  - EKG 12-Lead - [place], RETROPERITNL ABD,  LTD - TSH  2. Hyperlipidemia  - Lipid panel  3. T1_IDDM w/CKD 2 (83 ml/min)  - Microalbumin / creatinine urine ratio - HM DIABETES FOOT EXAM - LOW EXTREMITY NEUR EXAM DOCUM - Hemoglobin A1c - Insulin, random  4. Vitamin D deficiency  - Vit D  25 hydroxy (rtn osteoporosis monitoring)  5. Testosterone deficiency  - Testosterone  6. Idiopathic gout, unspecified chronicity, unspecified site  - Uric acid  7. Screening for rectal cancer  - POC Hemoccult Bld/Stl (3-Cd Home Screen); Future  8. Prostate cancer screening  - PSA  9. BMI 33.0-33.9,adult   10. Other fatigue  - Vitamin B12 - TSH  11. Medication management  - Urine Microscopic - CBC with Differential/Platelet - BASIC METABOLIC PANEL WITH GFR - Hepatic function panel - Magnesium   Continue prudent diet as discussed, weight control, BP monitoring, regular exercise, and medications as  discussed.  Discussed med effects and SE's. Routine screening labs and tests as requested with regular follow-up as recommended.  Over 40 minutes of exam, counseling &  chart review was performed

## 2015-05-25 NOTE — Patient Instructions (Signed)

## 2015-05-26 LAB — MICROALBUMIN / CREATININE URINE RATIO
Creatinine, Urine: 132.1 mg/dL
MICROALB/CREAT RATIO: 25 mg/g (ref 0.0–30.0)
Microalb, Ur: 3.3 mg/dL — ABNORMAL HIGH (ref <2.0)

## 2015-05-26 LAB — PSA: PSA: 0.24 ng/mL (ref <=4.00)

## 2015-05-26 LAB — HEPATIC FUNCTION PANEL
ALT: 25 U/L (ref 0–53)
AST: 19 U/L (ref 0–37)
Albumin: 4.1 g/dL (ref 3.5–5.2)
Alkaline Phosphatase: 88 U/L (ref 39–117)
BILIRUBIN DIRECT: 0.1 mg/dL (ref 0.0–0.3)
BILIRUBIN INDIRECT: 0.3 mg/dL (ref 0.2–1.2)
Total Bilirubin: 0.4 mg/dL (ref 0.2–1.2)
Total Protein: 7.6 g/dL (ref 6.0–8.3)

## 2015-05-26 LAB — VITAMIN D 25 HYDROXY (VIT D DEFICIENCY, FRACTURES): Vit D, 25-Hydroxy: 54 ng/mL (ref 30–100)

## 2015-05-26 LAB — BASIC METABOLIC PANEL WITH GFR
BUN: 13 mg/dL (ref 6–23)
CALCIUM: 9.8 mg/dL (ref 8.4–10.5)
CHLORIDE: 97 meq/L (ref 96–112)
CO2: 28 mEq/L (ref 19–32)
Creat: 0.79 mg/dL (ref 0.50–1.35)
GFR, Est African American: >89 mL/min
GFR, Est Non African American: >89 mL/min
GLUCOSE: 210 mg/dL — AB (ref 70–99)
POTASSIUM: 4.3 meq/L (ref 3.5–5.3)
Sodium: 135 mEq/L (ref 135–145)

## 2015-05-26 LAB — LIPID PANEL
CHOLESTEROL: 175 mg/dL (ref 0–200)
HDL: 38 mg/dL — ABNORMAL LOW (ref >=40)
LDL Cholesterol: 85 mg/dL (ref 0–99)
Total CHOL/HDL Ratio: 4.6 Ratio
Triglycerides: 262 mg/dL — ABNORMAL HIGH (ref <150)
VLDL: 52 mg/dL — AB (ref 0–40)

## 2015-05-26 LAB — TSH: TSH: 2.389 u[IU]/mL (ref 0.350–4.500)

## 2015-05-26 LAB — URINALYSIS, MICROSCOPIC ONLY
Bacteria, UA: NONE SEEN
Casts: NONE SEEN
Crystals: NONE SEEN
SQUAMOUS EPITHELIAL / LPF: NONE SEEN

## 2015-05-26 LAB — INSULIN, RANDOM: Insulin: 44.9 u[IU]/mL — ABNORMAL HIGH (ref 2.0–19.6)

## 2015-05-26 LAB — TESTOSTERONE: Testosterone: 225 ng/dL — ABNORMAL LOW (ref 300–890)

## 2015-05-26 LAB — HEMOGLOBIN A1C
Hgb A1c MFr Bld: 8.6 % — ABNORMAL HIGH (ref <5.7)
Mean Plasma Glucose: 200 mg/dL — ABNORMAL HIGH (ref <117)

## 2015-05-26 LAB — VITAMIN B12: Vitamin B-12: 1356 pg/mL — ABNORMAL HIGH (ref 211–911)

## 2015-05-26 LAB — URIC ACID: URIC ACID, SERUM: 7.1 mg/dL (ref 4.0–7.8)

## 2015-05-26 LAB — MAGNESIUM: MAGNESIUM: 1.6 mg/dL (ref 1.5–2.5)

## 2015-05-31 ENCOUNTER — Other Ambulatory Visit: Payer: Self-pay

## 2015-05-31 MED ORDER — METFORMIN HCL 1000 MG PO TABS: 1000.0000 mg | ORAL_TABLET | Freq: Every day | ORAL | Status: DC

## 2015-06-21 ENCOUNTER — Other Ambulatory Visit: Payer: Self-pay | Admitting: *Deleted

## 2015-06-21 DIAGNOSIS — Z1212 Encounter for screening for malignant neoplasm of rectum: Secondary | ICD-10-CM

## 2015-06-21 LAB — POC HEMOCCULT BLD/STL (HOME/3-CARD/SCREEN)
Card #3 Fecal Occult Blood, POC: NEGATIVE
FECAL OCCULT BLD: NEGATIVE
FECAL OCCULT BLD: NEGATIVE

## 2015-08-19 ENCOUNTER — Other Ambulatory Visit: Payer: Self-pay | Admitting: *Deleted

## 2015-08-19 MED ORDER — ALLOPURINOL 300 MG PO TABS: 300.0000 mg | ORAL_TABLET | Freq: Every day | ORAL | Status: DC

## 2015-08-29 ENCOUNTER — Encounter: Payer: Self-pay | Admitting: Internal Medicine

## 2015-09-05 ENCOUNTER — Ambulatory Visit (INDEPENDENT_AMBULATORY_CARE_PROVIDER_SITE_OTHER): Payer: 59 | Admitting: Internal Medicine

## 2015-09-05 ENCOUNTER — Encounter: Payer: Self-pay | Admitting: Internal Medicine

## 2015-09-05 VITALS — BP 122/70 | HR 74 | Temp 98.4°F | Resp 18 | Ht 73.0 in | Wt 263.0 lb

## 2015-09-05 DIAGNOSIS — Z23 Encounter for immunization: Secondary | ICD-10-CM

## 2015-09-05 DIAGNOSIS — Z79899 Other long term (current) drug therapy: Secondary | ICD-10-CM | POA: Diagnosis not present

## 2015-09-05 DIAGNOSIS — Z794 Long term (current) use of insulin: Secondary | ICD-10-CM

## 2015-09-05 DIAGNOSIS — E559 Vitamin D deficiency, unspecified: Secondary | ICD-10-CM

## 2015-09-05 DIAGNOSIS — E785 Hyperlipidemia, unspecified: Secondary | ICD-10-CM

## 2015-09-05 DIAGNOSIS — I10 Essential (primary) hypertension: Secondary | ICD-10-CM | POA: Diagnosis not present

## 2015-09-05 DIAGNOSIS — E1165 Type 2 diabetes mellitus with hyperglycemia: Secondary | ICD-10-CM | POA: Diagnosis not present

## 2015-09-05 LAB — CBC WITH DIFFERENTIAL/PLATELET
BASOS ABS: 0.1 10*3/uL (ref 0.0–0.1)
BASOS PCT: 1 % (ref 0–1)
Eosinophils Absolute: 0.2 10*3/uL (ref 0.0–0.7)
Eosinophils Relative: 3 % (ref 0–5)
HCT: 42.5 % (ref 39.0–52.0)
HEMOGLOBIN: 14.2 g/dL (ref 13.0–17.0)
Lymphocytes Relative: 40 % (ref 12–46)
Lymphs Abs: 2.7 10*3/uL (ref 0.7–4.0)
MCH: 30.8 pg (ref 26.0–34.0)
MCHC: 33.4 g/dL (ref 30.0–36.0)
MCV: 92.2 fL (ref 78.0–100.0)
MONOS PCT: 8 % (ref 3–12)
MPV: 10.7 fL (ref 8.6–12.4)
Monocytes Absolute: 0.5 10*3/uL (ref 0.1–1.0)
NEUTROS ABS: 3.3 10*3/uL (ref 1.7–7.7)
NEUTROS PCT: 48 % (ref 43–77)
Platelets: 287 10*3/uL (ref 150–400)
RBC: 4.61 MIL/uL (ref 4.22–5.81)
RDW: 13 % (ref 11.5–15.5)
WBC: 6.8 10*3/uL (ref 4.0–10.5)

## 2015-09-05 LAB — HEPATIC FUNCTION PANEL
ALT: 23 U/L (ref 9–46)
AST: 15 U/L (ref 10–40)
Albumin: 3.9 g/dL (ref 3.6–5.1)
Alkaline Phosphatase: 126 U/L — ABNORMAL HIGH (ref 40–115)
BILIRUBIN DIRECT: 0.1 mg/dL (ref <=0.2)
BILIRUBIN INDIRECT: 0.3 mg/dL (ref 0.2–1.2)
Total Bilirubin: 0.4 mg/dL (ref 0.2–1.2)
Total Protein: 7.4 g/dL (ref 6.1–8.1)

## 2015-09-05 LAB — BASIC METABOLIC PANEL WITH GFR
BUN: 11 mg/dL (ref 7–25)
CHLORIDE: 97 mmol/L — AB (ref 98–110)
CO2: 26 mmol/L (ref 20–31)
Calcium: 9.4 mg/dL (ref 8.6–10.3)
Creat: 0.89 mg/dL (ref 0.60–1.35)
GFR, Est African American: >89 mL/min (ref >=60)
Glucose, Bld: 355 mg/dL — ABNORMAL HIGH (ref 65–99)
Potassium: 4.4 mmol/L (ref 3.5–5.3)
SODIUM: 134 mmol/L — AB (ref 135–146)

## 2015-09-05 LAB — LIPID PANEL
CHOL/HDL RATIO: 5.7 ratio — AB (ref <=5.0)
Cholesterol: 172 mg/dL (ref 125–200)
HDL: 30 mg/dL — ABNORMAL LOW (ref >=40)
LDL CALC: 67 mg/dL (ref <130)
TRIGLYCERIDES: 374 mg/dL — AB (ref <150)
VLDL: 75 mg/dL — AB (ref <30)

## 2015-09-05 LAB — TSH: TSH: 1.807 u[IU]/mL (ref 0.350–4.500)

## 2015-09-05 MED ORDER — DAPAGLIFLOZIN PROPANEDIOL 10 MG PO TABS: 10.0000 mg | ORAL_TABLET | Freq: Every day | ORAL | Status: DC

## 2015-09-05 MED ORDER — METFORMIN HCL 1000 MG PO TABS: 1000.0000 mg | ORAL_TABLET | Freq: Two times a day (BID) | ORAL | Status: DC

## 2015-09-05 NOTE — Addendum Note (Signed)
Addended by: Jarelyn Bambach A on: 09/05/2015 11:51 AM   Modules accepted: Orders

## 2015-09-05 NOTE — Progress Notes (Signed)
Patient ID: Arthur Hudson, male   DOB: Oct 30, 1966, 49 y.o.   MRN: [id number]  Assessment and Plan:  Hypertension:  -Continue medication -monitor blood pressure at home. -Continue DASH diet -Reminder to go to the ER if any CP, SOB, nausea, dizziness, severe HA, changes vision/speech, left arm numbness and tingling and jaw pain.  Cholesterol - Continue diet and exercise -Check cholesterol.   Diabetes with diabetic chronic kidney disease  -change xigduo to farxiga and metformin -patient to call if not for free -Continue diet and exercise.  -Check A1C  Vitamin D Def -check level -continue medications.   Updated flu and pneumovax now  Continue diet and meds as discussed. Further disposition pending results of labs. Discussed med's effects and SE's.    HPI 49 y.o. male  presents for 3 month follow up with hypertension, hyperlipidemia, diabetes and vitamin D deficiency.   His blood pressure has been controlled at home, today their BP is BP: 122/70 mmHg.He does not workout. He denies chest pain, shortness of breath, dizziness.   He is on cholesterol medication and denies myalgias. His cholesterol is at goal. The cholesterol was:  05/25/2015: Cholesterol 175; HDL 38*; LDL Cholesterol 85; Triglycerides 262*   He has been working on diet and exercise for diabetes with diabetic chronic kidney disease, he is on bASA, he is on ACE/ARB, and denies  foot ulcerations, hyperglycemia, hypoglycemia , increased appetite, nausea, paresthesia of the feet, polydipsia, polyuria, visual disturbances, vomiting and weight loss. Last A1C was: 05/25/2015: Hgb A1c MFr Bld 8.6*.  He was unable to afford the xigduo as they said it would be 200 a month for it.  He has been out of it for 2-3 months.     Patient is on Vitamin D supplement. 05/25/2015: Vit D, 25-Hydroxy 54  He reports that his left knee has been bothering him especially when he is trying to cross his leg and also when he is trying to put his socks  on.  This has been going on for approximately 1 month. He has not injured it signficiantly in the past.  He reports that he has no injury that he can think of.  He reports that the pain is intermittent and only bothered by the activities above. He doesn't stretch much.  No change in activity.     Current Medications:  Current Outpatient Prescriptions on File Prior to Visit  Medication Sig Dispense Refill  . allopurinol (ZYLOPRIM) 300 MG tablet Take 1 tablet (300 mg total) by mouth daily. 30 tablet 0  . aspirin 81 MG tablet Take 81 mg by mouth daily.    . benazepril (LOTENSIN) 20 MG tablet TAKE ONE TABLET BY MOUTH AT BEDTIME FOR BLOOD PRESSURE AND KINNEY. (Patient taking differently: Take 20 mg by mouth every morning. ) 90 tablet 3  . bisoprolol-hydrochlorothiazide (ZIAC) 5-6.25 MG per tablet Take 1 tablet by mouth daily. Patient states he also takes the Benazepril per patient 90 tablet 3  . cholecalciferol (VITAMIN D) 1000 UNITS tablet Take 1,000 Units by mouth every other day.     . insulin detemir (LEVEMIR) 100 UNIT/ML injection Inject 18 Units into the skin 2 (two) times daily.     . metFORMIN (GLUCOPHAGE) 1000 MG tablet Take 1 tablet (1,000 mg total) by mouth daily with breakfast. 90 tablet 3  . Multiple Vitamin (MULTIVITAMIN WITH MINERALS) TABS tablet Take 1 tablet by mouth daily.    Marland Kitchen NEEDLE, DISP, 21 G 21G X 1" MISC Inject 2 mLs as  directed every 14 (fourteen) days. 100 each 1  . vitamin B-12 (CYANOCOBALAMIN) 100 MCG tablet Take 100 mcg by mouth daily.     No current facility-administered medications on file prior to visit.   Medical History:  Past Medical History  Diagnosis Date  . Hypertension   . Hyperlipidemia   . Diabetes mellitus type 2, insulin dependent ([place])   . Fatty liver disease, nonalcoholic   . Gout   . Vitamin D deficiency   . Carpal tunnel syndrome on both sides   . Kidney stone    Allergies:  Allergies  Allergen Reactions  . Ppd [Tuberculin Purified Protein  Derivative]     + PPD 2010  . Welchol [Colesevelam Hcl]     Chest pain     Review of Systems:  Review of Systems  Constitutional: Negative for fever, chills and malaise/fatigue.  HENT: Negative for congestion, ear pain and sore throat.   Eyes: Negative.   Respiratory: Negative for cough, shortness of breath and wheezing.   Cardiovascular: Negative for chest pain, palpitations and leg swelling.  Gastrointestinal: Negative for heartburn, nausea, vomiting, diarrhea, constipation, blood in stool and melena.  Genitourinary: Negative.   Skin: Negative.   Neurological: Negative for dizziness, sensory change, loss of consciousness and headaches.  Psychiatric/Behavioral: Negative for depression. The patient is not nervous/anxious and does not have insomnia.     Family history- Review and unchanged  Social history- Review and unchanged  Physical Exam: BP 122/70 mmHg  Pulse 74  Temp(Src) 98.4 F (36.9 C) (Temporal)  Resp 18  Ht 6\' 1"  (1.854 m)  Wt 263 lb (119.296 kg)  BMI 34.71 kg/m2 Wt Readings from Last 3 Encounters:  09/05/15 263 lb (119.296 kg)  05/25/15 256 lb (116.121 kg)  02/01/15 246 lb 8 oz (111.812 kg)   General Appearance: Well nourished well developed, non-toxic appearing, in no apparent distress. Eyes: PERRLA, EOMs, conjunctiva no swelling or erythema ENT/Mouth: Ear canals clear with no erythema, swelling, or discharge.  TMs normal bilaterally, oropharynx clear, moist, with no exudate.   Neck: Supple, thyroid normal, no JVD, no cervical adenopathy.  Respiratory: Respiratory effort normal, breath sounds clear A&P, no wheeze, rhonchi or rales noted.  No retractions, no accessory muscle usage Cardio: RRR with no MRGs. No noted edema.  Abdomen: Soft, + BS.  Non tender, no guarding, rebound, hernias, masses. Musculoskeletal: Full ROM, 5/5 strength, Normal gait Skin: Warm, dry without rashes, lesions, ecchymosis.  Neuro: Awake and oriented X 3, Cranial nerves intact. No  cerebellar symptoms.  Psych: normal affect, Insight and Judgment appropriate.    Starlyn Skeans, PA-C 11:06 AM [facility]

## 2015-09-06 LAB — HEMOGLOBIN A1C
Hgb A1c MFr Bld: 11.1 % — ABNORMAL HIGH (ref <5.7)
MEAN PLASMA GLUCOSE: 272 mg/dL — AB (ref <117)

## 2015-12-12 ENCOUNTER — Ambulatory Visit (INDEPENDENT_AMBULATORY_CARE_PROVIDER_SITE_OTHER): Payer: BLUE CROSS/BLUE SHIELD | Admitting: Internal Medicine

## 2015-12-12 ENCOUNTER — Encounter: Payer: Self-pay | Admitting: Internal Medicine

## 2015-12-12 VITALS — BP 130/78 | HR 64 | Temp 97.6°F | Resp 16 | Ht 73.0 in | Wt 262.2 lb

## 2015-12-12 DIAGNOSIS — M1000 Idiopathic gout, unspecified site: Secondary | ICD-10-CM

## 2015-12-12 DIAGNOSIS — I10 Essential (primary) hypertension: Secondary | ICD-10-CM

## 2015-12-12 DIAGNOSIS — Z79899 Other long term (current) drug therapy: Secondary | ICD-10-CM

## 2015-12-12 DIAGNOSIS — E785 Hyperlipidemia, unspecified: Secondary | ICD-10-CM | POA: Diagnosis not present

## 2015-12-12 DIAGNOSIS — E291 Testicular hypofunction: Secondary | ICD-10-CM

## 2015-12-12 DIAGNOSIS — E349 Endocrine disorder, unspecified: Secondary | ICD-10-CM

## 2015-12-12 DIAGNOSIS — E1022 Type 1 diabetes mellitus with diabetic chronic kidney disease: Secondary | ICD-10-CM | POA: Diagnosis not present

## 2015-12-12 DIAGNOSIS — N182 Chronic kidney disease, stage 2 (mild): Secondary | ICD-10-CM

## 2015-12-12 DIAGNOSIS — E559 Vitamin D deficiency, unspecified: Secondary | ICD-10-CM | POA: Diagnosis not present

## 2015-12-12 LAB — BASIC METABOLIC PANEL WITH GFR
BUN: 12 mg/dL (ref 7–25)
CO2: 26 mmol/L (ref 20–31)
Calcium: 9.4 mg/dL (ref 8.6–10.3)
Chloride: 97 mmol/L — ABNORMAL LOW (ref 98–110)
Creat: 0.9 mg/dL (ref 0.70–1.33)
GFR, Est Non African American: >89 mL/min (ref >=60)
Glucose, Bld: 334 mg/dL — ABNORMAL HIGH (ref 65–99)
POTASSIUM: 4.4 mmol/L (ref 3.5–5.3)
SODIUM: 134 mmol/L — AB (ref 135–146)

## 2015-12-12 LAB — CBC WITH DIFFERENTIAL/PLATELET
BASOS ABS: 0.1 10*3/uL (ref 0.0–0.1)
BASOS PCT: 1 % (ref 0–1)
EOS ABS: 0.2 10*3/uL (ref 0.0–0.7)
EOS PCT: 2 % (ref 0–5)
HCT: 43.2 % (ref 39.0–52.0)
Hemoglobin: 14.2 g/dL (ref 13.0–17.0)
LYMPHS ABS: 2.2 10*3/uL (ref 0.7–4.0)
Lymphocytes Relative: 27 % (ref 12–46)
MCH: 29.8 pg (ref 26.0–34.0)
MCHC: 32.9 g/dL (ref 30.0–36.0)
MCV: 90.6 fL (ref 78.0–100.0)
MPV: 11.2 fL (ref 8.6–12.4)
Monocytes Absolute: 0.9 10*3/uL (ref 0.1–1.0)
Monocytes Relative: 11 % (ref 3–12)
Neutro Abs: 4.8 10*3/uL (ref 1.7–7.7)
Neutrophils Relative %: 59 % (ref 43–77)
PLATELETS: 246 10*3/uL (ref 150–400)
RBC: 4.77 MIL/uL (ref 4.22–5.81)
RDW: 13.2 % (ref 11.5–15.5)
WBC: 8.2 10*3/uL (ref 4.0–10.5)

## 2015-12-12 LAB — LIPID PANEL
CHOLESTEROL: 155 mg/dL (ref 125–200)
HDL: 33 mg/dL — AB (ref >=40)
LDL CALC: 49 mg/dL (ref <130)
TRIGLYCERIDES: 365 mg/dL — AB (ref <150)
Total CHOL/HDL Ratio: 4.7 Ratio (ref <=5.0)
VLDL: 73 mg/dL — AB (ref <30)

## 2015-12-12 LAB — HEPATIC FUNCTION PANEL
ALBUMIN: 4 g/dL (ref 3.6–5.1)
ALK PHOS: 106 U/L (ref 40–115)
ALT: 26 U/L (ref 9–46)
AST: 14 U/L (ref 10–35)
BILIRUBIN TOTAL: 0.5 mg/dL (ref 0.2–1.2)
Bilirubin, Direct: 0.1 mg/dL (ref <=0.2)
Indirect Bilirubin: 0.4 mg/dL (ref 0.2–1.2)
Total Protein: 7.5 g/dL (ref 6.1–8.1)

## 2015-12-12 LAB — MAGNESIUM: MAGNESIUM: 1.7 mg/dL (ref 1.5–2.5)

## 2015-12-12 LAB — HEMOGLOBIN A1C
Hgb A1c MFr Bld: 11.8 % — ABNORMAL HIGH (ref <5.7)
MEAN PLASMA GLUCOSE: 292 mg/dL — AB (ref <117)

## 2015-12-12 LAB — TSH: TSH: 2.579 u[IU]/mL (ref 0.350–4.500)

## 2015-12-12 LAB — URIC ACID: Uric Acid, Serum: 5 mg/dL (ref 4.0–7.8)

## 2015-12-12 MED ORDER — CANAGLIFLOZIN 300 MG PO TABS: ORAL_TABLET | ORAL | Status: DC

## 2015-12-12 MED ORDER — CANAGLIFLOZIN 300 MG PO TABS: ORAL_TABLET | ORAL | Status: AC

## 2015-12-12 NOTE — Patient Instructions (Signed)

## 2015-12-12 NOTE — Progress Notes (Signed)
Patient ID: Arthur Hudson, male   DOB: 05-29-1966, 50 y.o.   MRN: [id number]   This very nice 50 y.o. Single BM presents for 6 month follow up with Hypertension, Hyperlipidemia, Insulin requiring T2_DM and Vitamin D Deficiency.    Patient labile HTN predating since Nov 2007 and is monitored expectantly & BP has been controlled at home. Today's BP: 130/78 mmHg. Patient has had no complaints of any cardiac type chest pain, palpitations, dyspnea/orthopnea/PND, dizziness, claudication, or dependent edema.   Hyperlipidemia is controlled with diet & meds. Patient denies myalgias or other med SE's. Last Lipids were at goal with Cholesterol 172; HDL 30*; LDL 67; but elevated Triglycerides 374 on 09/05/2015.   Also, the patient has history of Morbid Obesity (BMI 34+) and consequent insulin requiring T2_DM w/CKD 2 since Nov 2007  and has had no symptoms of reactive hypoglycemia, diabetic polys, paresthesias or visual blurring. Patient admits poor dietary compliance and infrequently checks CBG's which range 180-190 mg%.  Last A1c was not at goal with 11.1% on 09/05/2015.     Further, the patient also has history of Vitamin D Deficiency of "23" in 2008 and supplements vitamin D without any suspected side-effects. Last vitamin D was  54 on 05/25/2015.   Medication Sig  . allopurinol  300 MG tablet Take 1 tablet (300 mg total) by mouth daily.  Marland Kitchen aspirin 81 MG tablet Take 81 mg by mouth daily.  . benazepril 20 MG tablet TAKE ONE TABLET BY MOUTH AT BEDTIME   . bisoprolol-hctz (ZIAC) 5-6.25 MG  Take 1 tablet by mouth daily. Patient states he also takes the Benazepril per patient  . dapagliflozin (FARXIGA) 10 MG  Take 10 mg by mouth daily.  Marland Kitchen LEVEMIR 100 UNIT/ML inj Inject 18 Units into the skin 2 (two) times daily.   . metFORMIN 1000 MG tablet Take 1 tablet (1,000 mg total) by mouth 2 (two) times daily with a meal.  . MULTIVITAMIN WITH MINERALS Take 1 tablet by mouth daily.  . vitamin B-12  100 MCG tablet Take  100 mcg by mouth daily.  Marland Kitchen VITAMIN D 1000 UNITS  Take 1,000 Units by mouth every other day.    Allergies  Allergen Reactions  . 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 Welchol [Colesevelam Hcl]     Chest pain   PMHx:   Past Medical History  Diagnosis Date  . Hypertension   . Hyperlipidemia   . Diabetes mellitus type 2, insulin dependent ([place])   . Fatty liver disease, nonalcoholic   . Gout   . Vitamin D deficiency   . Carpal tunnel syndrome on both sides   . Kidney stone    Immunization History  Administered Date(s) Administered  . Influenza Split 09/05/2015  . Pneumococcal Polysaccharide-23 09/05/2015  . Pneumococcal-Unspecified 11/22/2009  . Tdap 11/22/2006   Past Surgical History  Procedure Laterality Date  . Cystoscopy with retrograde pyelogram, ureteroscopy and stent placement Right 02/01/2015    Procedure: CYSTOSCOPY WITH RETROGRADE PYELOGRAM, URETEROSCOPY, DIGITAL URETEROSCOPY with [place];  Surgeon: Alexis Frock, MD;  Location: [facility];  Service: Urology;  Laterality: Right;  . Holmium laser application Right 0000000    Procedure: HOLMIUM LASER APPLICATION;  Surgeon: Alexis Frock, MD;  Location: [facility];  Service: Urology;  Laterality: Right;   FHx:    Reviewed / unchanged  SHx:    Reviewed / unchanged  Systems Review:  Constitutional:  Denies fever, chills, wt changes, headaches, insomnia, fatigue, night sweats, change in appetite. Eyes: Denies redness, blurred vision, diplopia, discharge, itchy, watery eyes.  ENT: Denies discharge, congestion, post nasal drip, epistaxis, sore throat, earache, hearing loss, dental pain, tinnitus, vertigo, sinus pain, snoring.  CV: Denies chest pain, palpitations, irregular heartbeat, syncope, dyspnea, diaphoresis, orthopnea, PND, claudication or edema. Respiratory: denies cough, dyspnea, DOE, pleurisy, hoarseness, laryngitis, wheezing.   Gastrointestinal: Denies dysphagia, odynophagia, heartburn, reflux, water brash, abdominal pain or cramps, nausea, vomiting, bloating, diarrhea, constipation, hematemesis, melena, hematochezia  or hemorrhoids. Genitourinary: Denies dysuria, frequency, urgency, nocturia, hesitancy, discharge, hematuria or flank pain. Musculoskeletal: Denies arthralgias, myalgias, stiffness, jt. swelling, pain, limping or strain/sprain.  Skin: Denies pruritus, rash, hives, warts, acne, eczema or change in skin lesion(s). Neuro: No weakness, tremor, incoordination, spasms, paresthesia or pain. Psychiatric: Denies confusion, memory loss or sensory loss. Endo: Denies change in weight, skin or hair change.  Heme/Lymph: No excessive bleeding, bruising or enlarged lymph nodes.  Physical Exam  BP 130/78 mmHg  Pulse 64  Temp(Src) 97.6 F (36.4 C)  Resp 16  Ht 6\' 1"  (1.854 m)  Wt 262 lb 3.2 oz (118.933 kg)  BMI 34.60 kg/m2  Appears well nourished and in no distress. Eyes: PERRLA, EOMs, conjunctiva no swelling or erythema. Sinuses: No frontal/maxillary tenderness ENT/Mouth: EAC's clear, TM's nl w/o erythema, bulging. Nares clear w/o erythema, swelling, exudates. Oropharynx clear without erythema or exudates. Oral hygiene is good. Tongue normal, non obstructing. Hearing intact.  Neck: Supple. Thyroid nl. Car 2+/2+ without bruits, nodes or JVD. Chest: Respirations nl with BS clear & equal w/o rales, rhonchi, wheezing or stridor.  Cor: Heart sounds normal w/ regular rate and rhythm without sig. murmurs, gallops, clicks, or rubs. Peripheral pulses normal and equal  without edema.  Abdomen: Soft & bowel sounds normal. Non-tender w/o guarding, rebound, hernias, masses, or organomegaly.  Lymphatics: Unremarkable.  Musculoskeletal: Full ROM all peripheral extremities, joint stability, 5/5 strength, and normal gait.  Skin: Warm, dry without exposed rashes, lesions or ecchymosis apparent.  Neuro: Cranial nerves intact,  reflexes equal bilaterally. Sensory-motor testing grossly intact. Tendon reflexes grossly intact.  Pysch: Alert & oriented x 3.  Insight and judgement nl & appropriate. No ideations.  Assessment and Plan:  1. Essential hypertension  - TSH  2. Hyperlipidemia  - Lipid panel - TSH  3. 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 Vitamin D deficiency  - VITAMIN D 25 Hydroxy   5. Testosterone deficiency   6. Idiopathic gout  - Uric acid  7. Medication management  - CBC with Differential/Platelet - BASIC METABOLIC PANEL WITH GFR - Hepatic function panel - Magnesium   Recommended regular exercise, BP monitoring, weight control, and discussed med and SE's. Recommended labs to assess and monitor clinical status. Further disposition pending results of labs. Over 30 minutes of exam, counseling, chart review was performed

## 2015-12-13 LAB — VITAMIN D 25 HYDROXY (VIT D DEFICIENCY, FRACTURES): Vit D, 25-Hydroxy: 35 ng/mL (ref 30–100)

## 2015-12-13 LAB — INSULIN, RANDOM: Insulin: 20.5 u[IU]/mL — ABNORMAL HIGH (ref 2.0–19.6)

## 2016-01-02 ENCOUNTER — Other Ambulatory Visit: Payer: Self-pay | Admitting: Internal Medicine

## 2016-01-16 ENCOUNTER — Ambulatory Visit (INDEPENDENT_AMBULATORY_CARE_PROVIDER_SITE_OTHER): Payer: BLUE CROSS/BLUE SHIELD | Admitting: Physician Assistant

## 2016-01-16 ENCOUNTER — Encounter: Payer: Self-pay | Admitting: Physician Assistant

## 2016-01-16 VITALS — BP 108/70 | HR 61 | Temp 97.5°F | Resp 16 | Ht 73.0 in | Wt 254.6 lb

## 2016-01-16 DIAGNOSIS — I10 Essential (primary) hypertension: Secondary | ICD-10-CM

## 2016-01-16 DIAGNOSIS — E1165 Type 2 diabetes mellitus with hyperglycemia: Secondary | ICD-10-CM | POA: Diagnosis not present

## 2016-01-16 DIAGNOSIS — Z794 Long term (current) use of insulin: Secondary | ICD-10-CM

## 2016-01-16 DIAGNOSIS — R079 Chest pain, unspecified: Secondary | ICD-10-CM

## 2016-01-16 DIAGNOSIS — E785 Hyperlipidemia, unspecified: Secondary | ICD-10-CM

## 2016-01-16 MED ORDER — LIRAGLUTIDE 18 MG/3ML ~~LOC~~ SOPN: 1.2000 mg | PEN_INJECTOR | Freq: Every morning | SUBCUTANEOUS | Status: DC

## 2016-01-16 NOTE — Progress Notes (Signed)
Diabetes Education and Follow-Up Visit  50 y.o. AAM presents for diabetic education, has had type 2 DM x 2009.  Patient denies nausea, paresthesia of the feet, polydipsia and polyuria. The patient is on an ACE/ARB, he is on a low dose aspirin at this time. The patient does not exercise. He does currently smoke, and does currently drink alcohol.  He is on invokana, metformin, and levemir 18 units twice daily. The patient is checking his sugars at home. Has been drinking soda/sweet tea.   Patient is uncontrolled DM, smokes cigar rarely (1 cigar q 3 months), HTN- has had intermittent chest pain x Satruday worse with stress/pushing self at work- sharp pain, in very well localized, lasting 3-6 mins, better with rest, no accompaniments, was having burping/beltching with it that helped. Sugars were running 300's at that time.   ROS: no polyuria or polydipsia, dyspnea or TIA's, no numbness, tingling or pain in extremities  Physical Exam: Blood pressure 108/70, pulse 61, temperature 97.5 F (36.4 C), temperature source Temporal, resp. rate 16, height 6\' 1"  (1.854 m), weight 254 lb 9.6 oz (115.486 kg), SpO2 97 %. Body mass index is 33.6 kg/(m^2). General Appearance: Well nourished well developed, non-toxic appearing, in no apparent distress. Eyes: PERRLA, EOMs, conjunctiva no swelling or erythema ENT/Mouth: Ear canals clear with no erythema, swelling, or discharge.  TMs normal bilaterally, oropharynx clear, moist, with no exudate.   Neck: Supple, thyroid normal, no JVD, no cervical adenopathy.  Respiratory: Respiratory effort normal, breath sounds clear A&P, no wheeze, rhonchi or rales noted.  No retractions, no accessory muscle usage Cardio: RRR with no MRGs. No noted edema.  Abdomen: Soft, + BS.  Non tender, no guarding, rebound, hernias, masses. Musculoskeletal: Full ROM, 5/5 strength, Normal gait Skin: Warm, dry without rashes, lesions, ecchymosis.  Neuro: Awake and oriented X 3, Cranial nerves  intact. No cerebellar symptoms.  Psych: normal affect, Insight and Judgment appropriate.   Labs: Lab Results  Component Value Date   HGBA1C 11.8* 12/12/2015    Lab Results  Component Value Date   MICROALBUR 3.3* 05/25/2015    Lab Results  Component Value Date   CHOL 155 12/12/2015   HDL 33* 12/12/2015   LDLCALC 49 12/12/2015   TRIG 365* 12/12/2015   CHOLHDL 4.7 12/12/2015      Lab Results  Component Value Date   GFRNONAA >89 12/12/2015   Plan and Assessment: Diabetes Mellitus type 2:  Diabetes mellitus Type II, under poor control. Diabetes related complications: nephropathy Education: Reviewed 'ABCs' of diabetes management (respective goals in parentheses):  A1C (<7), blood pressure (<130/80), and cholesterol (LDL <100) Eye Exam yearly and Dental Exam every 6 months. Dietary recommendations Physical Activity recommendations START ON VICTOZA FOR WEIGHT LOSS/SUGAR CONTROL STOP SODA/SWEET TEA/ FRUIST JUICE   Blood pressure: normal blood pressure.   An ACE/ARB is currently part of their treatment regimen.  LDL goal of < 100, HDL > 40 and TG < 150. Dyslipidemia under good control. A statin is not currently part of their treatment regimen.  Atypical chest pain- normal EKG, get on PPI, dicussed patient is high risk for MI/stroke and that a stress test is appropriate at this time, he declines any work up, stay on ASA, if worsening CP or any SOB patient was told to go to the ER, expressed understanding.   Follow up: 2 months Future Appointments Date Time Provider [place]  03/19/2016 2:30 PM Vicie Mutters, PA-C [facility] None  06/25/2016 3:45 PM Unk Pinto, MD [facility] None

## 2016-01-16 NOTE — Patient Instructions (Signed)
Start VICTOZA injection as shown once daily at the same time of the day. Dial the dose to 0.6 mg on the pen for the first week. You may inject in the stomach, thigh or arm. You may experience nausea in the first few days which usually goes away.  You will feel fullness of the stomach with starting the medication and should try to keep the portions at meals small. After 1 week increase the dose to 1.2mg  daily if no nausea present.  If any questions or concerns are present call the office or the [facility] helpline at [phone]. Visit http://www.wall.info/ for more useful information  Please check blood sugars at least half the time about 2 hours after any meal and 3 times per week on waking up. Please bring blood sugar monitor to each visit. Recommended blood sugar levels about 2 hours after meal is 140-180 and on waking up 90-130  Can try stevia as sugar substitute.   Diabetes is a very complicated disease...lets simplify it.  An easy way to look at it to understand the complications is if you think of the extra sugar floating in your blood stream as glass shards floating through your blood stream.    Diabetes affects your small vessels first: 1) The glass shards (sugar) scraps down the tiny blood vessels in your eyes and lead to diabetic retinopathy, the leading cause of blindness in the [place]. Diabetes is the leading cause of newly diagnosed adult (23 to 51 years of age) blindness in the [place].  2) The glass shards scratches down the tiny vessels of your legs leading to nerve damage called neuropathy and can lead to amputations of your feet. More than 60% of all non-traumatic amputations of lower limbs occur in people with diabetes.  3) Over time the small vessels in your brain are shredded and closed off, individually this does not cause any problems but over a long period of time many of the small vessels being blocked can lead to Vascular Dementia.   4)  Your kidney's are a filter system and have a "net" that keeps certain things in the body and lets bad things out. Sugar shreds this net and leads to kidney damage and eventually failure. Decreasing the sugar that is destroying the net and certain blood pressure medications can help stop or decrease progression of kidney disease. Diabetes was the primary cause of kidney failure in 44 percent of all new cases in 2011.  5) Diabetes also destroys the small vessels in your penis that lead to erectile dysfunction. Eventually the vessels are so damaged that you may not be responsive to cialis or viagra.   Diabetes and your large vessels: Your larger vessels consist of your coronary arteries in your heart and the carotid vessels to your brain. Diabetes or even increased sugars put you at 300% increased risk of heart attack and stroke and this is why.. The sugar scrapes down your large blood vessels and your body sees this as an internal injury and tries to repair itself. Just like you get a scab on your skin, your platelets will stick to the blood vessel wall trying to heal it. This is why we have diabetics on low dose aspirin daily, this prevents the platelets from sticking and can prevent plaque formation. In addition, your body takes cholesterol and tries to shove it into the open wound. This is why we want your LDL, or bad cholesterol, below 70.   The combination of platelets and cholesterol  over 5-10 years forms plaque that can break off and cause a heart attack or stroke.   PLEASE REMEMBER:  Diabetes is preventable! Up to 53 percent of complications and morbidities among individuals with type 2 diabetes can be prevented, delayed, or effectively treated and minimized with regular visits to a health professional, appropriate monitoring and medication, and a healthy diet and lifestyle.     Bad carbs also include fruit juice, alcohol, and sweet tea. These are empty calories that do not signal to your brain  that you are full.   Please remember the good carbs are still carbs which convert into sugar. So please measure them out no more than 1/2-1 cup of rice, oatmeal, pasta, and beans  Veggies are however free foods! Pile them on.   Not all fruit is created equal. Please see the list below, the fruit at the bottom is higher in sugars than the fruit at the top. Please avoid all dried fruits.        Your A1C is a measure of your sugar over the past 3 months and is not affected by what you have eaten over the past few days. Diabetes increases your chances of stroke and heart attack over 300 % and is the leading cause of blindness and kidney failure in the [place]. Please make sure you decrease bad carbs like white bread, white rice, potatoes, corn, soft drinks, pasta, cereals, refined sugars, sweet tea, dried fruits, and fruit juice. Good carbs are okay to eat in moderation like sweet potatoes, brown rice, whole grain pasta/bread, most fruit (except dried fruit) and you can eat as many veggies as you want.   Greater than 6.5 is considered diabetic. Between 6.4 and 5.7 is prediabetic If your A1C is less than 5.7 you are NOT diabetic.  Targets for Glucose Readings: Time of Check Target for patients WITHOUT Diabetes Target for DIABETICS  Before Meals Less than 100  less than 150  Two hours after meals Less than 200  Less than 250

## 2016-02-02 ENCOUNTER — Other Ambulatory Visit: Payer: Self-pay | Admitting: Internal Medicine

## 2016-02-04 NOTE — Addendum Note (Signed)
Addended by: Dylanie Quesenberry A on: 02/04/2016 11:53 AM   Modules accepted: Orders

## 2016-02-22 ENCOUNTER — Other Ambulatory Visit: Payer: Self-pay | Admitting: Internal Medicine

## 2016-02-27 ENCOUNTER — Other Ambulatory Visit: Payer: Self-pay | Admitting: Internal Medicine

## 2016-03-19 ENCOUNTER — Ambulatory Visit (INDEPENDENT_AMBULATORY_CARE_PROVIDER_SITE_OTHER): Payer: BLUE CROSS/BLUE SHIELD | Admitting: Physician Assistant

## 2016-03-19 ENCOUNTER — Encounter: Payer: Self-pay | Admitting: Physician Assistant

## 2016-03-19 VITALS — BP 120/70 | HR 78 | Temp 97.3°F | Resp 16 | Ht 73.0 in | Wt 249.2 lb

## 2016-03-19 DIAGNOSIS — E291 Testicular hypofunction: Secondary | ICD-10-CM | POA: Diagnosis not present

## 2016-03-19 DIAGNOSIS — M1000 Idiopathic gout, unspecified site: Secondary | ICD-10-CM | POA: Diagnosis not present

## 2016-03-19 DIAGNOSIS — E1165 Type 2 diabetes mellitus with hyperglycemia: Secondary | ICD-10-CM

## 2016-03-19 DIAGNOSIS — Z794 Long term (current) use of insulin: Secondary | ICD-10-CM

## 2016-03-19 DIAGNOSIS — I10 Essential (primary) hypertension: Secondary | ICD-10-CM

## 2016-03-19 DIAGNOSIS — E349 Endocrine disorder, unspecified: Secondary | ICD-10-CM

## 2016-03-19 DIAGNOSIS — Z79899 Other long term (current) drug therapy: Secondary | ICD-10-CM

## 2016-03-19 DIAGNOSIS — E785 Hyperlipidemia, unspecified: Secondary | ICD-10-CM

## 2016-03-19 DIAGNOSIS — E559 Vitamin D deficiency, unspecified: Secondary | ICD-10-CM | POA: Diagnosis not present

## 2016-03-19 LAB — CBC WITH DIFFERENTIAL/PLATELET
Basophils Absolute: 79 cells/uL (ref 0–200)
Basophils Relative: 1 %
EOS PCT: 3 %
Eosinophils Absolute: 237 cells/uL (ref 15–500)
HCT: 42 % (ref 38.5–50.0)
Hemoglobin: 14.3 g/dL (ref 13.2–17.1)
Lymphocytes Relative: 41 %
Lymphs Abs: 3239 cells/uL (ref 850–3900)
MCH: 30.7 pg (ref 27.0–33.0)
MCHC: 34 g/dL (ref 32.0–36.0)
MCV: 90.1 fL (ref 80.0–100.0)
MONOS PCT: 8 %
MPV: 10.1 fL (ref 7.5–12.5)
Monocytes Absolute: 632 cells/uL (ref 200–950)
NEUTROS ABS: 3713 {cells}/uL (ref 1500–7800)
Neutrophils Relative %: 47 %
PLATELETS: 288 10*3/uL (ref 140–400)
RBC: 4.66 MIL/uL (ref 4.20–5.80)
RDW: 14 % (ref 11.0–15.0)
WBC: 7.9 10*3/uL (ref 3.8–10.8)

## 2016-03-19 LAB — HEMOGLOBIN A1C
HEMOGLOBIN A1C: 7.2 % — AB (ref <5.7)
MEAN PLASMA GLUCOSE: 160 mg/dL

## 2016-03-19 MED ORDER — LIRAGLUTIDE 18 MG/3ML ~~LOC~~ SOPN: PEN_INJECTOR | SUBCUTANEOUS | Status: DC

## 2016-03-19 NOTE — Patient Instructions (Addendum)
Increase victoza to 1.8mg  daily Start to decrease night time levemir dose-- decrease 3 units every 3 days your morning sugar is below 120.   Recommendations For Diabetic/Prediabetic Patients:   -  Take medications as prescribed  -  Recommend Dr Fara Olden Fuhrman's book "The End of Diabetes "  And "The End of Dieting"- Can get at  www.[email or web address] and encourage also get the Audio CD book  - AVOID Animal products, ie. Meat - red/white, Poultry and Dairy/especially cheese - Exercise at least 5 times a week for 30 minutes or preferably daily.  - No Smoking - Drink less than 2 drinks a day.  - Monitor your feet for sores - Have yearly Eye Exams - Recommend annual Flu vaccine  - Recommend Pneumovax and Prevnar vaccines - Shingles Vaccine (Zostavax) if over 81 y.o.  Goals:   - BMI less than 24 - Fasting sugar less than 130 or less than 150 if tapering medicines to lose weight  - Systolic BP less than [id number]  - Diastolic BP less than 80 - Bad LDL Cholesterol less than 70 - Triglycerides less than 150

## 2016-03-19 NOTE — Progress Notes (Signed)
Patient ID: Arthur Hudson, male   DOB: 1966/08/12, 50 y.o.   MRN: [id number]  Assessment and Plan:  Hypertension:  -Continue medication -monitor blood pressure at home. -Continue DASH diet -Reminder to go to the ER if any CP, SOB, nausea, dizziness, severe HA, changes vision/speech, left arm numbness and tingling and jaw pain.  Cholesterol - Continue diet and exercise -Check cholesterol.   Diabetes with diabetic chronic kidney disease  -Continue diet and exercise.  -Check A1C  Vitamin D Def -check level -continue medications.   Obesity with co morbidities - long discussion about weight loss, diet, and exercise   Continue diet and meds as discussed. Further disposition pending results of labs. Discussed med's effects and SE's.    HPI 50 y.o. male  presents for 3 month follow up with hypertension, hyperlipidemia, diabetes and vitamin D deficiency.   His blood pressure has been controlled at home, today their BP is BP: 120/70 mmHg.He does not workout. He denies chest pain, shortness of breath, dizziness. Had chest pain last visit, states had not had any CP while mowing grass.    He is on cholesterol medication and denies myalgias. His cholesterol is at goal. The cholesterol was:  12/12/2015: Cholesterol 155; HDL 33*; LDL Cholesterol 49; Triglycerides 365*   He has been working on diet and exercise for diabetes with diabetic chronic kidney disease, has had DM x 2009, he is on bASA, he is on ACE/ARB, he is on invokana, levamir 18units BID, victoza and metformin 1000mg  BID and denies  foot ulcerations, hyperglycemia, hypoglycemia , increased appetite, nausea, paresthesia of the feet, polydipsia, polyuria, visual disturbances, vomiting and weight loss.  Sugars are 112-126, Last A1C was: 12/12/2015: Hgb A1c MFr Bld 11.8*.     Patient is on Vitamin D supplement. 12/12/2015: Vit D, 25-Hydroxy 35  BMI is Body mass index is 32.88 kg/(m^2)., he is working on diet and exercise, down 5 lbs. Wt  Readings from Last 3 Encounters:  03/19/16 249 lb 3.2 oz (113.036 kg)  01/16/16 254 lb 9.6 oz (115.486 kg)  12/12/15 262 lb 3.2 oz (118.933 kg)      Current Medications:  Current Outpatient Prescriptions on File Prior to Visit  Medication Sig Dispense Refill  . allopurinol (ZYLOPRIM) 300 MG tablet TAKE 1 TABLET BY MOUTH EVERY DAY 90 tablet 1  . aspirin 81 MG tablet Take 81 mg by mouth daily.    . benazepril (LOTENSIN) 20 MG tablet TAKE 1 TABLET AT BEDTIME FOR BLOOD PRESSURE AND KIDNEY 90 tablet 0  . bisoprolol-hydrochlorothiazide (ZIAC) 5-6.25 MG tablet TAKE 1 TABLET EVERY DAY 90 tablet 0  . canagliflozin (INVOKANA) 300 MG TABS tablet Take 1 tablet daily for Diabetes 30 tablet 12  . Cholecalciferol (VITAMIN D) 2000 units CAPS Take 1 capsule by mouth daily.    . insulin detemir (LEVEMIR) 100 UNIT/ML injection Inject 18 Units into the skin 2 (two) times daily.     . Liraglutide (VICTOZA) 18 MG/3ML SOPN Inject 0.2 mLs (1.2 mg total) into the skin every morning. 6 mL 3  . Magnesium 500 MG TABS Take by mouth daily.    . metFORMIN (GLUCOPHAGE) 1000 MG tablet Take 1 tablet (1,000 mg total) by mouth 2 (two) times daily with a meal. 180 tablet 3  . Multiple Vitamin (MULTIVITAMIN WITH MINERALS) TABS tablet Take 1 tablet by mouth daily.    Marland Kitchen NEEDLE, DISP, 21 G 21G X 1" MISC Inject 2 mLs as directed every 14 (fourteen) days. 100 each 1  .  vitamin B-12 (CYANOCOBALAMIN) 100 MCG tablet Take 100 mcg by mouth daily.     No current facility-administered medications on file prior to visit.   Medical History:  Past Medical History  Diagnosis Date  . Hypertension   . Hyperlipidemia   . Diabetes mellitus type 2, insulin dependent ([place])   . Fatty liver disease, nonalcoholic   . Gout   . Vitamin D deficiency   . Carpal tunnel syndrome on both sides   . Kidney stone    Allergies:  Allergies  Allergen Reactions  . 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 Welchol [Colesevelam  Hcl]     Chest pain     Review of Systems:  Review of Systems  Constitutional: Negative for fever, chills and malaise/fatigue.  HENT: Negative for congestion, ear pain and sore throat.   Eyes: Negative.   Respiratory: Negative for cough, shortness of breath and wheezing.   Cardiovascular: Negative for chest pain, palpitations and leg swelling.  Gastrointestinal: Negative for heartburn, nausea, vomiting, diarrhea, constipation, blood in stool and melena.  Genitourinary: Negative.   Skin: Negative.   Neurological: Negative for dizziness, sensory change, loss of consciousness and headaches.  Psychiatric/Behavioral: Negative for depression. The patient is not nervous/anxious and does not have insomnia.     Family history- Review and unchanged  Social history- Review and unchanged  Physical Exam: BP 120/70 mmHg  Pulse 78  Temp(Src) 97.3 F (36.3 C) (Temporal)  Resp 16  Ht 6\' 1"  (1.854 m)  Wt 249 lb 3.2 oz (113.036 kg)  BMI 32.88 kg/m2  SpO2 96% Wt Readings from Last 3 Encounters:  03/19/16 249 lb 3.2 oz (113.036 kg)  01/16/16 254 lb 9.6 oz (115.486 kg)  12/12/15 262 lb 3.2 oz (118.933 kg)   General Appearance: Well nourished well developed, non-toxic appearing, in no apparent distress. Eyes: PERRLA, EOMs, conjunctiva no swelling or erythema ENT/Mouth: Ear canals clear with no erythema, swelling, or discharge.  TMs normal bilaterally, oropharynx clear, moist, with no exudate.   Neck: Supple, thyroid normal, no JVD, no cervical adenopathy.  Respiratory: Respiratory effort normal, breath sounds clear A&P, no wheeze, rhonchi or rales noted.  No retractions, no accessory muscle usage Cardio: RRR with no MRGs. No noted edema.  Abdomen: Soft, + BS.  Non tender, no guarding, rebound, hernias, masses. Musculoskeletal: Full ROM, 5/5 strength, Normal gait Skin: Warm, dry without rashes, lesions, ecchymosis.  Neuro: Awake and oriented X 3, Cranial nerves intact. No cerebellar symptoms.   Psych: normal affect, Insight and Judgment appropriate.    Vicie Mutters, PA-C 2:49 PM [facility]

## 2016-03-20 LAB — LIPID PANEL
CHOLESTEROL: 152 mg/dL (ref 125–200)
HDL: 37 mg/dL — AB (ref >=40)
LDL Cholesterol: 84 mg/dL (ref <130)
TRIGLYCERIDES: 155 mg/dL — AB (ref <150)
Total CHOL/HDL Ratio: 4.1 Ratio (ref <=5.0)
VLDL: 31 mg/dL — ABNORMAL HIGH (ref <30)

## 2016-03-20 LAB — MAGNESIUM: MAGNESIUM: 1.9 mg/dL (ref 1.5–2.5)

## 2016-03-20 LAB — HEPATIC FUNCTION PANEL
ALBUMIN: 4.2 g/dL (ref 3.6–5.1)
ALT: 19 U/L (ref 9–46)
AST: 15 U/L (ref 10–35)
Alkaline Phosphatase: 99 U/L (ref 40–115)
BILIRUBIN DIRECT: 0.1 mg/dL (ref <=0.2)
BILIRUBIN TOTAL: 0.3 mg/dL (ref 0.2–1.2)
Indirect Bilirubin: 0.2 mg/dL (ref 0.2–1.2)
Total Protein: 7.3 g/dL (ref 6.1–8.1)

## 2016-03-20 LAB — BASIC METABOLIC PANEL WITH GFR
BUN: 18 mg/dL (ref 7–25)
CALCIUM: 9.7 mg/dL (ref 8.6–10.3)
CHLORIDE: 101 mmol/L (ref 98–110)
CO2: 25 mmol/L (ref 20–31)
CREATININE: 0.92 mg/dL (ref 0.70–1.33)
GFR, Est Non African American: >89 mL/min (ref >=60)
Glucose, Bld: 93 mg/dL (ref 65–99)
Potassium: 4.7 mmol/L (ref 3.5–5.3)
SODIUM: 137 mmol/L (ref 135–146)

## 2016-03-20 LAB — INSULIN, FASTING: Insulin fasting, serum: 42.9 u[IU]/mL — ABNORMAL HIGH (ref 2.0–19.6)

## 2016-03-20 LAB — VITAMIN D 25 HYDROXY (VIT D DEFICIENCY, FRACTURES): VIT D 25 HYDROXY: 63 ng/mL (ref 30–100)

## 2016-03-20 LAB — TSH: TSH: 2.19 mIU/L (ref 0.40–4.50)

## 2016-03-20 LAB — URIC ACID: URIC ACID, SERUM: 5.4 mg/dL (ref 4.0–7.8)

## 2016-05-03 ENCOUNTER — Other Ambulatory Visit: Payer: Self-pay | Admitting: Internal Medicine

## 2016-05-11 ENCOUNTER — Other Ambulatory Visit: Payer: Self-pay | Admitting: Internal Medicine

## 2016-05-15 ENCOUNTER — Other Ambulatory Visit: Payer: Self-pay | Admitting: Physician Assistant

## 2016-05-17 ENCOUNTER — Encounter: Payer: Self-pay | Admitting: Internal Medicine

## 2016-05-30 ENCOUNTER — Other Ambulatory Visit: Payer: Self-pay | Admitting: *Deleted

## 2016-05-30 MED ORDER — BISOPROLOL-HYDROCHLOROTHIAZIDE 5-6.25 MG PO TABS: 1.0000 | ORAL_TABLET | Freq: Every day | ORAL | Status: DC

## 2016-06-14 ENCOUNTER — Encounter: Payer: Self-pay | Admitting: Internal Medicine

## 2016-06-25 ENCOUNTER — Encounter: Payer: Self-pay | Admitting: Internal Medicine

## 2016-06-25 ENCOUNTER — Ambulatory Visit (INDEPENDENT_AMBULATORY_CARE_PROVIDER_SITE_OTHER): Payer: BLUE CROSS/BLUE SHIELD | Admitting: Internal Medicine

## 2016-06-25 VITALS — BP 128/78 | HR 84 | Temp 97.5°F | Resp 16 | Ht 73.5 in | Wt 246.0 lb

## 2016-06-25 DIAGNOSIS — E785 Hyperlipidemia, unspecified: Secondary | ICD-10-CM

## 2016-06-25 DIAGNOSIS — Z79899 Other long term (current) drug therapy: Secondary | ICD-10-CM | POA: Diagnosis not present

## 2016-06-25 DIAGNOSIS — Z136 Encounter for screening for cardiovascular disorders: Secondary | ICD-10-CM | POA: Diagnosis not present

## 2016-06-25 DIAGNOSIS — R5383 Other fatigue: Secondary | ICD-10-CM

## 2016-06-25 DIAGNOSIS — I10 Essential (primary) hypertension: Secondary | ICD-10-CM

## 2016-06-25 DIAGNOSIS — Z113 Encounter for screening for infections with a predominantly sexual mode of transmission: Secondary | ICD-10-CM

## 2016-06-25 DIAGNOSIS — Z125 Encounter for screening for malignant neoplasm of prostate: Secondary | ICD-10-CM

## 2016-06-25 DIAGNOSIS — M1000 Idiopathic gout, unspecified site: Secondary | ICD-10-CM

## 2016-06-25 DIAGNOSIS — R6889 Other general symptoms and signs: Secondary | ICD-10-CM | POA: Diagnosis not present

## 2016-06-25 DIAGNOSIS — E1021 Type 1 diabetes mellitus with diabetic nephropathy: Secondary | ICD-10-CM | POA: Diagnosis not present

## 2016-06-25 DIAGNOSIS — Z0001 Encounter for general adult medical examination with abnormal findings: Secondary | ICD-10-CM

## 2016-06-25 DIAGNOSIS — E349 Endocrine disorder, unspecified: Secondary | ICD-10-CM

## 2016-06-25 DIAGNOSIS — E559 Vitamin D deficiency, unspecified: Secondary | ICD-10-CM

## 2016-06-25 DIAGNOSIS — Z1211 Encounter for screening for malignant neoplasm of colon: Secondary | ICD-10-CM

## 2016-06-25 LAB — CBC WITH DIFFERENTIAL/PLATELET
BASOS PCT: 1 %
Basophils Absolute: 71 cells/uL (ref 0–200)
Eosinophils Absolute: 213 cells/uL (ref 15–500)
Eosinophils Relative: 3 %
HCT: 45.3 % (ref 38.5–50.0)
Hemoglobin: 15.4 g/dL (ref 13.2–17.1)
LYMPHS ABS: 2982 {cells}/uL (ref 850–3900)
MCH: 30.5 pg (ref 27.0–33.0)
MCHC: 34 g/dL (ref 32.0–36.0)
MCV: 89.7 fL (ref 80.0–100.0)
MONO ABS: 497 {cells}/uL (ref 200–950)
MPV: 10.2 fL (ref 7.5–12.5)
Monocytes Relative: 7 %
NEUTROS ABS: 3337 {cells}/uL (ref 1500–7800)
NEUTROS PCT: 47 %
Platelets: 287 10*3/uL (ref 140–400)
RBC: 5.05 MIL/uL (ref 4.20–5.80)
RDW: 14.4 % (ref 11.0–15.0)
WBC: 7.1 10*3/uL (ref 3.8–10.8)

## 2016-06-25 LAB — TSH: TSH: 2.07 m[IU]/L (ref 0.40–4.50)

## 2016-06-25 LAB — PSA: PSA: 0.2 ng/mL (ref <=4.0)

## 2016-06-25 LAB — VITAMIN B12: Vitamin B-12: 718 pg/mL (ref 200–1100)

## 2016-06-25 NOTE — Progress Notes (Signed)
[facility]   Unk Pinto, M.D.    Uvaldo Bristle. Silverio Lay, P.A.-C      Starlyn Skeans, P.A.-C   [facility]                [address] [place] [id number] Telephone [phone] Telefax [phone]  Annual  Screening/Preventative Visit And Comprehensive Evaluation & Examination     This very nice 50y.o. DBM presents for a Wellness/Preventative Visit & comprehensive evaluation and management of multiple medical co-morbidities.  Patient has been followed for HTN, insulin requiring T2_IDDM , Hyperlipidemia and Vitamin D Deficiency. Patient a;lso has hx/o gout which has been controlled with his Allopurinol.      HTN predates since 2007 also. Patient's BP has been controlled at home.Today's BP is 128/78. Patient denies any cardiac symptoms as chest pain, palpitations, shortness of breath, dizziness or ankle swelling.    Patient's hyperlipidemia is controlled with diet and medications. Patient denies myalgias or other medication SE's. Last lipids were at goal:   Lab Results  Component Value Date   CHOL 152 03/19/2016   HDL 37 (L) 03/19/2016   LDLCALC 84 03/19/2016   TRIG 155 (H) 03/19/2016   CHOLHDL 4.1 03/19/2016      Patient has Morbid Obesity (BMI 32+) and consequentlyT2_NIDDM predating since Nov 2007 and control has been poor as relates to his compliance and patient denies reactive hypoglycemic symptoms, visual blurring, diabetic polys or paresthesias. Patient is on poly therapy with Levemir, Victoza & Metformin.  Last A1c was not at goal: Lab Results  Component Value Date   HGBA1C 7.2 (H) 03/19/2016       Finally, patient has history of Vitamin D Deficiency of of "23" in 2008  and last vitamin D was at goal: Lab Results  Component Value Date   VD25OH 63 03/19/2016   Current Outpatient Prescriptions on File Prior to Visit  Medication Sig  . allopurinol (ZYLOPRIM) 300 MG tablet TAKE 1  TABLET BY MOUTH EVERY DAY  . aspirin 81 MG tablet Take 81 mg by mouth daily.  . benazepril (LOTENSIN) 20 MG tablet TAKE 1 TABLET AT BEDTIME FOR BLOOD PRESSURE AND KIDNEY  . bisoprolol-hydrochlorothiazide (ZIAC) 5-6.25 MG tablet Take 1 tablet by mouth daily.  . canagliflozin (INVOKANA) 300 MG TABS tablet Take 1 tablet daily for Diabetes  . Cholecalciferol (VITAMIN D) 2000 units CAPS Take 1 capsule by mouth daily.  . insulin detemir (LEVEMIR) 100 UNIT/ML injection Inject 18 Units into the skin 2 (two) times daily.   . Magnesium 500 MG TABS Take by mouth daily.  . metFORMIN (GLUCOPHAGE) 1000 MG tablet Take 1 tablet (1,000 mg total) by mouth 2 (two) times daily with a meal.  . Multiple Vitamin (MULTIVITAMIN WITH MINERALS) TABS tablet Take 1 tablet by mouth daily.  Marland Kitchen NEEDLE, DISP, 21 G 21G X 1" MISC Inject 2 mLs as directed every 14 (fourteen) days.  Marland Kitchen VICTOZA 18 MG/3ML SOPN INCREASE VICTOZA TO 1.8MG  DAILY.  . vitamin B-12 (CYANOCOBALAMIN) 100 MCG tablet Take 100 mcg by mouth daily.   No current facility-administered medications on file prior to visit.    Allergies  Allergen Reactions  . 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 Welchol [Colesevelam Hcl]     Chest pain   Past Medical History:  Diagnosis Date  . Carpal tunnel syndrome on  both sides   . Diabetes mellitus type 2, insulin dependent ([place])   . Fatty liver disease, nonalcoholic   . Gout   . Hyperlipidemia   . Hypertension   . Kidney stone   . Vitamin D deficiency    Health Maintenance  Topic Date Due  . COLONOSCOPY  10/26/2015  . OPHTHALMOLOGY EXAM  02/14/2016  . INFLUENZA VACCINE  06/12/2016  . HIV Screening  08/13/2016 (Originally 10/25/1980)  . HEMOGLOBIN A1C  09/19/2016  . TETANUS/TDAP  11/22/2016  . FOOT EXAM  12/11/2016  . PNEUMOCOCCAL POLYSACCHARIDE VACCINE  Completed   Immunization History  Administered Date(s) Administered  . Influenza Split 09/05/2015  . Pneumococcal  Polysaccharide-23 09/05/2015  . Pneumococcal-Unspecified 11/22/2009  . Tdap 11/22/2006   Past Surgical History:  Procedure Laterality Date  . CYSTOSCOPY WITH RETROGRADE PYELOGRAM, URETEROSCOPY AND STENT PLACEMENT Right 02/01/2015   Procedure: CYSTOSCOPY WITH RETROGRADE PYELOGRAM, URETEROSCOPY, DIGITAL URETEROSCOPY with [place];  Surgeon: Alexis Frock, MD;  Location: [facility];  Service: Urology;  Laterality: Right;  . HOLMIUM LASER APPLICATION Right 0000000   Procedure: HOLMIUM LASER APPLICATION;  Surgeon: Alexis Frock, MD;  Location: [facility];  Service: Urology;  Laterality: Right;   Family History  Problem Relation Age of Onset  . Hypertension Mother   . Diabetes Mother   . Asthma Mother   . Diabetes Father   . Hypertension Father   . Stroke Father   . Hypertension Sister   . Hyperlipidemia Sister     Social History   Social History  . Marital status: Divorced    Spouse name: N/A  . Number of children: N/A  . Years of education: N/A   Occupational History  . Cook    Social History Main Topics  . Smoking status: Current Some Day Smoker    Types: Cigars  . Smokeless tobacco: Never Used  . Alcohol use Yes     Comment: social  . Drug use: No  . Sexual activity: Not on file    ROS Constitutional: Denies fever, chills, weight loss/gain, headaches, insomnia,  night sweats or change in appetite. Does c/o fatigue. Eyes: Denies redness, blurred vision, diplopia, discharge, itchy or watery eyes.  ENT: Denies discharge, congestion, post nasal drip, epistaxis, sore throat, earache, hearing loss, dental pain, Tinnitus, Vertigo, Sinus pain or snoring.  Cardio: Denies chest pain, palpitations, irregular heartbeat, syncope, dyspnea, diaphoresis, orthopnea, PND, claudication or edema Respiratory: denies cough, dyspnea, DOE, pleurisy, hoarseness, laryngitis or wheezing.  Gastrointestinal: Denies dysphagia, heartburn,  reflux, water brash, pain, cramps, nausea, vomiting, bloating, diarrhea, constipation, hematemesis, melena, hematochezia, jaundice or hemorrhoids Genitourinary: Denies dysuria, frequency, urgency, nocturia, hesitancy, discharge, hematuria or flank pain Musculoskeletal: Denies arthralgia, myalgia, stiffness, Jt. Swelling, pain, limp or strain/sprain. Denies Falls. Skin: Denies puritis, rash, hives, warts, acne, eczema or change in skin lesion Neuro: No weakness, tremor, incoordination, spasms, paresthesia or pain Psychiatric: Denies confusion, memory loss or sensory loss. Denies Depression. Endocrine: Denies change in weight, skin, hair change, nocturia, and paresthesia, diabetic polys, visual blurring or hyper / hypo glycemic episodes.  Heme/Lymph: No excessive bleeding, bruising or enlarged lymph nodes.  Physical Exam  BP 128/78   Pulse 84   Temp 97.5 F (36.4 C)   Resp 16   Ht 6' 1.5" (1.867 m)   Wt 246 lb (111.6 kg)   BMI 32.02 kg/m   General Appearance: Well nourished, in no apparent distress. Eyes: PERRLA, EOMs, conjunctiva no swelling or erythema, normal  fundi and vessels. Sinuses: No frontal/maxillary tenderness ENT/Mouth: EACs patent / TMs  nl. Nares clear without erythema, swelling, mucoid exudates. Oral hygiene is good. No erythema, swelling, or exudate. Tongue normal, non-obstructing. Tonsils not swollen or erythematous. Hearing normal.  Neck: Supple, thyroid normal. No bruits, nodes or JVD. Respiratory: Respiratory effort normal.  BS equal and clear bilateral without rales, rhonci, wheezing or stridor. Cardio: Heart sounds are normal with regular rate and rhythm and no murmurs, rubs or gallops. Peripheral pulses are normal and equal bilaterally without edema. No aortic or femoral bruits. Chest: symmetric with normal excursions and percussion.  Abdomen: Soft, rotund with Nl bowel sounds. Nontender, no guarding, rebound, hernias, masses, or organomegaly.  Lymphatics: Non  tender without lymphadenopathy.  Genitourinary: No hernias.Testes nl. DRE - prostate nl for age - smooth & firm w/o nodules. Musculoskeletal: Full ROM all peripheral extremities, joint stability, 5/5 strength, and normal gait. Skin: Warm and dry without rashes, lesions, cyanosis, clubbing or  ecchymosis.  Neuro: Cranial nerves intact, reflexes equal bilaterally. Normal muscle tone, no cerebellar symptoms. Sensation intact to touch, Vibratory & Monofilament to the toes bilaterally. Marland Kitchen  Pysch: Alert and oriented X 3 with normal affect, insight and judgment appropriate.   Assessment and Plan  1. Annual Preventative/Screening Exam   1. Encounter for general adult medical examination with abnormal findings   2. Essential hypertension  - EKG 12-Lead - [place], RETROPERITNL ABD,  LTD - TSH  3. Hyperlipidemia  - Lipid panel - TSH  4. Vitamin D deficiency  - VITAMIN D 25 Hydroxy   5. 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 Idiopathic gout  - Uric acid  7. Testosterone deficiency  - Testosterone  8. Colon cancer screening  - POC Hemoccult Bld/Stl   9. Prostate cancer screening  - PSA  10. Screening for ischemic heart disease   11. Screening for AAA (aortic abdominal aneurysm)   12. Other fatigue  - Vitamin B12 - Iron and TIBC - Testosterone - CBC with Differential/Platelet - TSH  13. Medication management  - Urinalysis, Routine w reflex microscopic  - CBC with Differential/Platelet - BASIC METABOLIC PANEL WITH GFR - Hepatic function panel - Magnesium     Continue prudent diet as discussed, weight control, BP monitoring, regular exercise, and medications as discussed.  Discussed med effects and SE's. Routine screening labs and tests as requested with regular follow-up as recommended. Over 40 minutes of exam,  counseling, chart review and high complex critical decision making was performed

## 2016-06-25 NOTE — Patient Instructions (Signed)

## 2016-06-26 LAB — URINALYSIS, MICROSCOPIC ONLY
BACTERIA UA: NONE SEEN [HPF]
Casts: NONE SEEN [LPF]
Crystals: NONE SEEN [HPF]
RBC / HPF: NONE SEEN RBC/HPF (ref <=2)
SQUAMOUS EPITHELIAL / LPF: NONE SEEN [HPF] (ref <=5)
WBC UA: NONE SEEN WBC/HPF (ref <=5)
Yeast: NONE SEEN [HPF]

## 2016-06-26 LAB — URINALYSIS, ROUTINE W REFLEX MICROSCOPIC
Bilirubin Urine: NEGATIVE
HGB URINE DIPSTICK: NEGATIVE
KETONES UR: NEGATIVE
LEUKOCYTES UA: NEGATIVE
NITRITE: NEGATIVE
PROTEIN: NEGATIVE
Specific Gravity, Urine: 1.024 (ref 1.001–1.035)
pH: 5.5 (ref 5.0–8.0)

## 2016-06-26 LAB — LIPID PANEL
CHOL/HDL RATIO: 3.4 ratio (ref <=5.0)
Cholesterol: 165 mg/dL (ref 125–200)
HDL: 49 mg/dL (ref >=40)
LDL CALC: 72 mg/dL (ref <130)
Triglycerides: 222 mg/dL — ABNORMAL HIGH (ref <150)
VLDL: 44 mg/dL — AB (ref <30)

## 2016-06-26 LAB — HEPATIC FUNCTION PANEL
ALK PHOS: 72 U/L (ref 40–115)
ALT: 24 U/L (ref 9–46)
AST: 18 U/L (ref 10–35)
Albumin: 4.5 g/dL (ref 3.6–5.1)
BILIRUBIN DIRECT: 0.1 mg/dL (ref <=0.2)
Indirect Bilirubin: 0.3 mg/dL (ref 0.2–1.2)
Total Bilirubin: 0.4 mg/dL (ref 0.2–1.2)
Total Protein: 8 g/dL (ref 6.1–8.1)

## 2016-06-26 LAB — HEMOGLOBIN A1C
Hgb A1c MFr Bld: 6.9 % — ABNORMAL HIGH (ref <5.7)
Mean Plasma Glucose: 151 mg/dL

## 2016-06-26 LAB — IRON AND TIBC
%SAT: 17 % (ref 15–60)
IRON: 61 ug/dL (ref 50–180)
TIBC: 353 ug/dL (ref 250–425)
UIBC: 292 ug/dL (ref 125–400)

## 2016-06-26 LAB — BASIC METABOLIC PANEL WITH GFR
BUN: 17 mg/dL (ref 7–25)
CHLORIDE: 103 mmol/L (ref 98–110)
CO2: 21 mmol/L (ref 20–31)
Calcium: 9.8 mg/dL (ref 8.6–10.3)
Creat: 1.01 mg/dL (ref 0.70–1.33)
GFR, EST NON AFRICAN AMERICAN: 86 mL/min (ref >=60)
GLUCOSE: 116 mg/dL — AB (ref 65–99)
POTASSIUM: 4 mmol/L (ref 3.5–5.3)
Sodium: 139 mmol/L (ref 135–146)

## 2016-06-26 LAB — RPR

## 2016-06-26 LAB — TESTOSTERONE: TESTOSTERONE: 367 ng/dL (ref 250–827)

## 2016-06-26 LAB — MICROALBUMIN / CREATININE URINE RATIO
Creatinine, Urine: 60 mg/dL (ref 20–370)
MICROALB/CREAT RATIO: 10 ug/mg{creat} (ref <30)
Microalb, Ur: 0.6 mg/dL

## 2016-06-26 LAB — HSV(HERPES SIMPLEX VRS) I + II AB-IGG: HSV 1 GLYCOPROTEIN G AB, IGG: 31.2 {index} — AB (ref <0.90)

## 2016-06-26 LAB — GC/CHLAMYDIA PROBE AMP
CT PROBE, AMP APTIMA: NOT DETECTED
GC Probe RNA: NOT DETECTED

## 2016-06-26 LAB — HIV ANTIBODY (ROUTINE TESTING W REFLEX): HIV 1&2 Ab, 4th Generation: NONREACTIVE

## 2016-06-26 LAB — VITAMIN D 25 HYDROXY (VIT D DEFICIENCY, FRACTURES): Vit D, 25-Hydroxy: 86 ng/mL (ref 30–100)

## 2016-06-26 LAB — URIC ACID: URIC ACID, SERUM: 6.3 mg/dL (ref 4.0–8.0)

## 2016-06-26 LAB — MAGNESIUM: Magnesium: 2 mg/dL (ref 1.5–2.5)

## 2016-08-31 ENCOUNTER — Other Ambulatory Visit: Payer: Self-pay | Admitting: Internal Medicine

## 2016-10-13 ENCOUNTER — Other Ambulatory Visit: Payer: Self-pay | Admitting: Internal Medicine

## 2016-10-22 ENCOUNTER — Encounter: Payer: Self-pay | Admitting: Internal Medicine

## 2016-10-22 ENCOUNTER — Ambulatory Visit (INDEPENDENT_AMBULATORY_CARE_PROVIDER_SITE_OTHER): Payer: BLUE CROSS/BLUE SHIELD | Admitting: Internal Medicine

## 2016-10-22 VITALS — BP 130/80 | HR 80 | Temp 98.4°F | Resp 16 | Ht 73.0 in | Wt 250.0 lb

## 2016-10-22 DIAGNOSIS — E782 Mixed hyperlipidemia: Secondary | ICD-10-CM | POA: Diagnosis not present

## 2016-10-22 DIAGNOSIS — Z23 Encounter for immunization: Secondary | ICD-10-CM

## 2016-10-22 DIAGNOSIS — N182 Chronic kidney disease, stage 2 (mild): Secondary | ICD-10-CM

## 2016-10-22 DIAGNOSIS — Z79899 Other long term (current) drug therapy: Secondary | ICD-10-CM

## 2016-10-22 DIAGNOSIS — M25531 Pain in right wrist: Secondary | ICD-10-CM | POA: Diagnosis not present

## 2016-10-22 DIAGNOSIS — E559 Vitamin D deficiency, unspecified: Secondary | ICD-10-CM

## 2016-10-22 DIAGNOSIS — I129 Hypertensive chronic kidney disease with stage 1 through stage 4 chronic kidney disease, or unspecified chronic kidney disease: Secondary | ICD-10-CM | POA: Diagnosis not present

## 2016-10-22 DIAGNOSIS — E1122 Type 2 diabetes mellitus with diabetic chronic kidney disease: Secondary | ICD-10-CM | POA: Diagnosis not present

## 2016-10-22 DIAGNOSIS — I10 Essential (primary) hypertension: Secondary | ICD-10-CM | POA: Diagnosis not present

## 2016-10-22 LAB — HEPATIC FUNCTION PANEL
ALBUMIN: 4.6 g/dL (ref 3.6–5.1)
ALK PHOS: 93 U/L (ref 40–115)
ALT: 23 U/L (ref 9–46)
AST: 19 U/L (ref 10–35)
BILIRUBIN INDIRECT: 0.2 mg/dL (ref 0.2–1.2)
Bilirubin, Direct: 0.1 mg/dL (ref <=0.2)
TOTAL PROTEIN: 8 g/dL (ref 6.1–8.1)
Total Bilirubin: 0.3 mg/dL (ref 0.2–1.2)

## 2016-10-22 LAB — CBC WITH DIFFERENTIAL/PLATELET
BASOS PCT: 1 %
Basophils Absolute: 75 cells/uL (ref 0–200)
EOS ABS: 225 {cells}/uL (ref 15–500)
Eosinophils Relative: 3 %
HEMATOCRIT: 48 % (ref 38.5–50.0)
HEMOGLOBIN: 15.9 g/dL (ref 13.2–17.1)
LYMPHS ABS: 2925 {cells}/uL (ref 850–3900)
Lymphocytes Relative: 39 %
MCH: 30.7 pg (ref 27.0–33.0)
MCHC: 33.1 g/dL (ref 32.0–36.0)
MCV: 92.7 fL (ref 80.0–100.0)
MONO ABS: 525 {cells}/uL (ref 200–950)
MPV: 10.4 fL (ref 7.5–12.5)
Monocytes Relative: 7 %
NEUTROS PCT: 50 %
Neutro Abs: 3750 cells/uL (ref 1500–7800)
Platelets: 267 10*3/uL (ref 140–400)
RBC: 5.18 MIL/uL (ref 4.20–5.80)
RDW: 13.8 % (ref 11.0–15.0)
WBC: 7.5 10*3/uL (ref 3.8–10.8)

## 2016-10-22 LAB — BASIC METABOLIC PANEL WITH GFR
BUN: 12 mg/dL (ref 7–25)
CO2: 27 mmol/L (ref 20–31)
Calcium: 10 mg/dL (ref 8.6–10.3)
Chloride: 101 mmol/L (ref 98–110)
Creat: 0.84 mg/dL (ref 0.70–1.33)
GFR, Est African American: >89 mL/min (ref >=60)
GLUCOSE: 115 mg/dL — AB (ref 65–99)
POTASSIUM: 4.8 mmol/L (ref 3.5–5.3)
Sodium: 138 mmol/L (ref 135–146)

## 2016-10-22 LAB — LIPID PANEL
Cholesterol: 167 mg/dL (ref <200)
HDL: 40 mg/dL — ABNORMAL LOW (ref >40)
LDL CALC: 71 mg/dL (ref <100)
Total CHOL/HDL Ratio: 4.2 Ratio (ref <5.0)
Triglycerides: 278 mg/dL — ABNORMAL HIGH (ref <150)
VLDL: 56 mg/dL — ABNORMAL HIGH (ref <30)

## 2016-10-22 LAB — HEMOGLOBIN A1C
HEMOGLOBIN A1C: 6.6 % — AB (ref <5.7)
MEAN PLASMA GLUCOSE: 143 mg/dL

## 2016-10-22 LAB — TSH: TSH: 1.91 mIU/L (ref 0.40–4.50)

## 2016-10-22 NOTE — Progress Notes (Signed)
Assessment and Plan:  Hypertension:  -Continue medication,  -monitor blood pressure at home.  -Continue DASH diet.   -Reminder to go to the ER if any CP, SOB, nausea, dizziness, severe HA, changes vision/speech, left arm numbness and tingling, and jaw pain.  Cholesterol: -Continue diet and exercise.  -Check cholesterol.   Diabetes with nephropathy: -consider changing to [place] -with copay should be $0 copay -is not currently checking CBGs -encouraged compliance -Please note patient is not Type 1 diabetic he is Type 2 -Continue diet and exercise.  -Check A1C  Vitamin D Def: -check level -continue medications.   Right wrist pain -try night guards -consider referral to ortho if no improvement.  Continue diet and meds as discussed. Further disposition pending results of labs.  HPI 50 y.o. male  presents for 3 month follow up with hypertension, hyperlipidemia, prediabetes and vitamin D.   His blood pressure has been controlled at home, today their BP is BP: 130/80.   He does workout. He denies chest pain, shortness of breath, dizziness.  He does a lot of lifting.  He has to move pots and pans and run around the kitchen.     He is not on cholesterol medication and denies myalgias. His cholesterol is at goal. The cholesterol last visit was:   Lab Results  Component Value Date   CHOL 165 06/25/2016   HDL 49 06/25/2016   LDLCALC 72 06/25/2016   TRIG 222 (H) 06/25/2016   CHOLHDL 3.4 06/25/2016     He has been working on diet and exercise for prediabetes, and denies foot ulcerations, hyperglycemia, hypoglycemia , increased appetite, nausea, paresthesia of the feet, polydipsia, polyuria, visual disturbances, vomiting and weight loss. Last A1C in the office was:  Lab Results  Component Value Date   HGBA1C 6.9 (H) 06/25/2016  He has not been checking his blood sugars lately.  He reports that he has not had symptoms lately of really low blood sugars.  He is taking 12 units of  levemir in the morning.  No numbness in the hands or feet.   Patient is on Vitamin D supplement.  Lab Results  Component Value Date   VD25OH 86 06/25/2016     He does have right wrist pain. It has been bothering him a while.  He is ready to do something about it soon.  He thinks that it is carpal tunnel.  He has not tried night guards.  He has intermittent numbness.   Current Medications:  Current Outpatient Prescriptions on File Prior to Visit  Medication Sig Dispense Refill  . allopurinol (ZYLOPRIM) 300 MG tablet TAKE 1 TABLET BY MOUTH EVERY DAY 90 tablet 1  . aspirin 81 MG tablet Take 81 mg by mouth daily.    . benazepril (LOTENSIN) 20 MG tablet TAKE 1 TABLET AT BEDTIME FOR BLOOD PRESSURE AND KIDNEY 90 tablet 0  . bisoprolol-hydrochlorothiazide (ZIAC) 5-6.25 MG tablet TAKE 1 TABLET BY MOUTH DAILY. 90 tablet 0  . canagliflozin (INVOKANA) 300 MG TABS tablet Take 1 tablet daily for Diabetes 30 tablet 12  . Cholecalciferol (VITAMIN D) 2000 units CAPS Take 1 capsule by mouth daily.    . insulin detemir (LEVEMIR) 100 UNIT/ML injection Inject 18 Units into the skin 2 (two) times daily.     . Magnesium 500 MG TABS Take by mouth daily.    . metFORMIN (GLUCOPHAGE) 1000 MG tablet TAKE 1 TABLET BY MOUTH TWICE A DAY WITH MEALS 180 tablet 2  . Multiple Vitamin (MULTIVITAMIN WITH MINERALS)  TABS tablet Take 1 tablet by mouth daily.    Marland Kitchen NEEDLE, DISP, 21 G 21G X 1" MISC Inject 2 mLs as directed every 14 (fourteen) days. 100 each 1  . VICTOZA 18 MG/3ML SOPN INCREASE VICTOZA TO 1.8MG  DAILY. 9 pen 1  . vitamin B-12 (CYANOCOBALAMIN) 100 MCG tablet Take 100 mcg by mouth daily.     No current facility-administered medications on file prior to visit.     Medical History:  Past Medical History:  Diagnosis Date  . Carpal tunnel syndrome on both sides   . Diabetes mellitus type 2, insulin dependent ([place])   . Fatty liver disease, nonalcoholic   . Gout   . Hyperlipidemia   . Hypertension   . Kidney  stone   . Vitamin D deficiency     Allergies:  Allergies  Allergen Reactions  . 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 Welchol [Colesevelam Hcl]     Chest pain     Review of Systems:  Review of Systems  Constitutional: Negative for chills, fever and malaise/fatigue.  HENT: Negative for congestion, ear pain and sore throat.   Eyes: Negative.   Respiratory: Negative for cough, shortness of breath and wheezing.   Cardiovascular: Negative for chest pain, palpitations and leg swelling.  Gastrointestinal: Negative for abdominal pain, blood in stool, constipation, diarrhea, heartburn and melena.  Genitourinary: Negative.   Skin: Negative.   Neurological: Negative for dizziness, sensory change, loss of consciousness and headaches.  Psychiatric/Behavioral: Negative for depression. The patient is not nervous/anxious and does not have insomnia.     Family history- Review and unchanged  Social history- Review and unchanged  Physical Exam: BP 130/80   Pulse 80   Temp 98.4 F (36.9 C) (Temporal)   Resp 16   Ht 6\' 1"  (1.854 m)   Wt 250 lb (113.4 kg)   BMI 32.98 kg/m  Wt Readings from Last 3 Encounters:  10/22/16 250 lb (113.4 kg)  06/25/16 246 lb (111.6 kg)  03/19/16 249 lb 3.2 oz (113 kg)    General Appearance: Well nourished well developed, in no apparent distress. Eyes: PERRLA, EOMs, conjunctiva no swelling or erythema ENT/Mouth: Ear canals normal without obstruction, swelling, erythma, discharge.  TMs normal bilaterally.  Oropharynx moist, clear, without exudate, or postoropharyngeal swelling. Neck: Supple, thyroid normal,no cervical adenopathy  Respiratory: Respiratory effort normal, Breath sounds clear A&P without rhonchi, wheeze, or rale.  No retractions, no accessory usage. Cardio: RRR with no MRGs. Brisk peripheral pulses without edema.  Abdomen: Soft, + BS,  Non tender, no guarding, rebound, hernias, masses. Musculoskeletal: Full ROM, 5/5  strength, Normal gait Skin: Warm, dry without rashes, lesions, ecchymosis.  Neuro: Awake and oriented X 3, Cranial nerves intact. Normal muscle tone, no cerebellar symptoms. Psych: Normal affect, Insight and Judgment appropriate.    Starlyn Skeans, PA-C 9:54 AM [facility]

## 2016-11-02 ENCOUNTER — Encounter: Payer: Self-pay | Admitting: *Deleted

## 2016-11-10 ENCOUNTER — Other Ambulatory Visit: Payer: Self-pay | Admitting: Internal Medicine

## 2016-11-12 ENCOUNTER — Other Ambulatory Visit: Payer: Self-pay | Admitting: Internal Medicine

## 2016-12-02 ENCOUNTER — Other Ambulatory Visit: Payer: Self-pay | Admitting: Internal Medicine

## 2017-01-17 ENCOUNTER — Other Ambulatory Visit: Payer: Self-pay | Admitting: Internal Medicine

## 2017-01-18 NOTE — Progress Notes (Signed)
This very nice 51 y.o. DBM presents for 3 month follow up with Hypertension, Hyperlipidemia, Pre-Diabetes and Vitamin D Deficiency.  Also he has hx/o Gout apparently controlled with meds.      Patient is treated for HTN (2007) & BP has been controlled at home. Today's BP s at goal -  124/70. Patient has had no complaints of any cardiac type chest pain, palpitations, dyspnea/orthopnea/PND, dizziness, claudication, or dependent edema.     Hyperlipidemia is controlled with diet & meds. Patient denies myalgias or other med SE's. Last Lipids were at goal albeit elevated Trig's: Lab Results  Component Value Date   CHOL 167 10/22/2016   HDL 40 (L) 10/22/2016   LDLCALC 71 10/22/2016   TRIG 278 (H) 10/22/2016   CHOLHDL 4.2 10/22/2016      Also, the patient has history of Morbid Obesity (BMI 32+) and   Insulin Requiring T2_DM (2007)  and has had no symptoms of reactive hypoglycemia, diabetic polys, paresthesias or visual blurring.  Patient is currently on Levemir & Metformin and has had to stop Victoza and Invokana when his co-pays jumped up into the $300-400 monthly co-pay. Since off of the latter he has appropriately increased his Levemir dosing top keep his CBG's less than 200 mg%. He also admits poor dietary compliance with numerous "excuses".   He has recently switched jobs again and is advised a less expensive alternative would be too switch to Novolin 70/30 mix BID. Last A1c was not at goal: Lab Results  Component Value Date   HGBA1C 6.6 (H) 10/22/2016      Further, the patient also has history of Vitamin D Deficiency ("23" in 2008) and supplements vitamin D without any suspected side-effects. Last vitamin D was at goal: Lab Results  Component Value Date   VD25OH 86 06/25/2016   Current Outpatient Prescriptions on File Prior to Visit  Medication Sig  . allopurinol  300 MG  TAKE 1 TAB  EVERY DAY  . aspirin 81 MG  Take daily.  . benazepril 20 MG tablet TAKE 1 TAB AT BEDTIME   .  bisoprolol-hctz 5-6.25 MG tablet TAKE 1 TAB DAILY.  Marland Kitchen VITAMIN D 2000 units  Take 1 cap daily.  Marland Kitchen LEVEMIR  100 u/ml  injec Inject 18 Units into the skin 2 x  daily.   . Magnesium 500 MG TABS Take by mouth daily.  . metFORMIN 1000 MG  TAKE 1 TAB TWICE A DAY WITH MEALS  . Multi-Vit w/Min Take 1 tab daily.  . vitamin B-12  100 MCG tab Take 100 mcg  daily.   Allergies  Allergen Reactions  . 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 Welchol [Colesevelam Hcl]     Chest pain   PMHx:   Past Medical History:  Diagnosis Date  . Carpal tunnel syndrome on both sides   . Diabetes mellitus type 2, insulin dependent ([place])   . Fatty liver disease, nonalcoholic   . Gout   . Hyperlipidemia   . Hypertension   . Kidney stone   . Vitamin D deficiency    Immunization History  Administered Date(s) Administered  . Influenza Split 09/05/2015  . Influenza,inj,quad, With Preservative 10/22/2016  . Pneumococcal Polysaccharide-23 09/05/2015  . Pneumococcal-Unspecified 11/22/2009  . Tdap 11/22/2006   Past Surgical History:  Procedure Laterality Date  . CYSTOSCOPY WITH RETROGRADE PYELOGRAM, URETEROSCOPY AND STENT PLACEMENT Right 02/01/2015   Procedure: CYSTOSCOPY WITH RETROGRADE PYELOGRAM, URETEROSCOPY, DIGITAL URETEROSCOPY  with [place];  Surgeon: Alexis Frock, MD;  Location: [facility];  Service: Urology;  Laterality: Right;  . HOLMIUM LASER APPLICATION Right 2/69/4854   Procedure: HOLMIUM LASER APPLICATION;  Surgeon: Alexis Frock, MD;  Location: [facility];  Service: Urology;  Laterality: Right;   FHx:    Reviewed / unchanged  SHx:    Reviewed / unchanged  Systems Review:  Constitutional: Denies fever, chills, wt changes, headaches, insomnia, fatigue, night sweats, change in appetite. Eyes: Denies redness, blurred vision, diplopia, discharge, itchy, watery eyes.  ENT: Denies discharge, congestion, post nasal drip,  epistaxis, sore throat, earache, hearing loss, dental pain, tinnitus, vertigo, sinus pain, snoring.  CV: Denies chest pain, palpitations, irregular heartbeat, syncope, dyspnea, diaphoresis, orthopnea, PND, claudication or edema. Respiratory: denies cough, dyspnea, DOE, pleurisy, hoarseness, laryngitis, wheezing.  Gastrointestinal: Denies dysphagia, odynophagia, heartburn, reflux, water brash, abdominal pain or cramps, nausea, vomiting, bloating, diarrhea, constipation, hematemesis, melena, hematochezia  or hemorrhoids. Genitourinary: Denies dysuria, frequency, urgency, nocturia, hesitancy, discharge, hematuria or flank pain. Musculoskeletal: Denies arthralgias, myalgias, stiffness, jt. swelling, pain, limping or strain/sprain.  Skin: Denies pruritus, rash, hives, warts, acne, eczema or change in skin lesion(s). Neuro: No weakness, tremor, incoordination, spasms, paresthesia or pain. Psychiatric: Denies confusion, memory loss or sensory loss. Endo: Denies change in weight, skin or hair change.  Heme/Lymph: No excessive bleeding, bruising or enlarged lymph nodes.  Physical Exam  BP 124/70   Pulse 84   Temp 97.8 F (36.6 C)   Resp 16   Ht 6' 1.5" (1.867 m)   Wt 256 lb 12.8 oz (116.5 kg)   BMI 33.42 kg/m   Appears well nourished and in no distress.  Eyes: PERRLA, EOMs, conjunctiva no swelling or erythema. Sinuses: No frontal/maxillary tenderness ENT/Mouth: EAC's clear, TM's nl w/o erythema, bulging. Nares clear w/o erythema, swelling, exudates. Oropharynx clear without erythema or exudates. Oral hygiene is good. Tongue normal, non obstructing. Hearing intact.  Neck: Supple. Thyroid nl. Car 2+/2+ without bruits, nodes or JVD. Chest: Respirations nl with BS clear & equal w/o rales, rhonchi, wheezing or stridor.  Cor: Heart sounds normal w/ regular rate and rhythm without sig. murmurs, gallops, clicks, or rubs. Peripheral pulses normal and equal  without edema.  Abdomen: Soft & bowel sounds  normal. Non-tender w/o guarding, rebound, hernias, masses, or organomegaly.  Lymphatics: Unremarkable.  Musculoskeletal: Full ROM all peripheral extremities, joint stability, 5/5 strength, and normal gait.  Skin: Warm, dry without exposed rashes, lesions or ecchymosis apparent.  Neuro: Cranial nerves intact, reflexes equal bilaterally. Sensory-motor testing grossly intact. Tendon reflexes grossly intact.  Pysch: Alert & oriented x 3.  Insight and judgement nl & appropriate. No ideations.  Assessment and Plan:  1. Essential hypertension  - Continue medication, monitor blood pressure at home.  - Continue DASH diet. Reminder to go to the ER if any CP,  SOB, nausea, dizziness, severe HA, changes vision/speech,  left arm numbness and tingling and jaw pain. - CBC with Differential/Platelet - BASIC METABOLIC PANEL WITH GFR - Magnesium - TSH  2. Mixed hyperlipidemia  - Continue diet/meds, exercise,& lifestyle modifications.  - Continue monitor periodic cholesterol/liver & renal functions  - Hepatic function panel - Lipid panel - TSH  3. Insulin-requiring or dependent type II diabetes mellitus ([place])  - Continue diet, exercise, lifestyle modifications.  - Monitor appropriate labs. - Hemoglobin A1c  4. Vitamin D deficiency  - Continue supplementation. - VITAMIN D 25 Hydroxy   5. 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 Idiopathic gout   7. Medication management  - CBC with Differential/Platelet - BASIC METABOLIC PANEL WITH GFR - Hepatic function panel - Magnesium - Lipid panel - TSH - Hemoglobin A1c - VITAMIN D 25 Hydroxy - Uric acid       Recommended regular exercise, BP monitoring, weight control, and discussed med and SE's. Recommended labs to assess and monitor clinical status. Further disposition pending results of labs. Over 30 minutes of exam, counseling, chart review was performed

## 2017-01-21 ENCOUNTER — Encounter: Payer: Self-pay | Admitting: Internal Medicine

## 2017-01-21 ENCOUNTER — Ambulatory Visit (INDEPENDENT_AMBULATORY_CARE_PROVIDER_SITE_OTHER): Payer: BLUE CROSS/BLUE SHIELD | Admitting: Internal Medicine

## 2017-01-21 VITALS — BP 124/70 | HR 84 | Temp 97.8°F | Resp 16 | Ht 73.5 in | Wt 256.8 lb

## 2017-01-21 DIAGNOSIS — Z794 Long term (current) use of insulin: Secondary | ICD-10-CM | POA: Diagnosis not present

## 2017-01-21 DIAGNOSIS — N182 Chronic kidney disease, stage 2 (mild): Secondary | ICD-10-CM | POA: Diagnosis not present

## 2017-01-21 DIAGNOSIS — I10 Essential (primary) hypertension: Secondary | ICD-10-CM

## 2017-01-21 DIAGNOSIS — E559 Vitamin D deficiency, unspecified: Secondary | ICD-10-CM | POA: Diagnosis not present

## 2017-01-21 DIAGNOSIS — M1000 Idiopathic gout, unspecified site: Secondary | ICD-10-CM

## 2017-01-21 DIAGNOSIS — E1122 Type 2 diabetes mellitus with diabetic chronic kidney disease: Secondary | ICD-10-CM

## 2017-01-21 DIAGNOSIS — E782 Mixed hyperlipidemia: Secondary | ICD-10-CM

## 2017-01-21 DIAGNOSIS — E119 Type 2 diabetes mellitus without complications: Secondary | ICD-10-CM | POA: Diagnosis not present

## 2017-01-21 DIAGNOSIS — Z79899 Other long term (current) drug therapy: Secondary | ICD-10-CM | POA: Diagnosis not present

## 2017-01-21 DIAGNOSIS — I129 Hypertensive chronic kidney disease with stage 1 through stage 4 chronic kidney disease, or unspecified chronic kidney disease: Secondary | ICD-10-CM | POA: Diagnosis not present

## 2017-01-21 LAB — LIPID PANEL
CHOLESTEROL: 196 mg/dL (ref <200)
HDL: 27 mg/dL — ABNORMAL LOW (ref >40)
Total CHOL/HDL Ratio: 7.3 Ratio — ABNORMAL HIGH (ref <5.0)
Triglycerides: 1188 mg/dL — ABNORMAL HIGH (ref <150)

## 2017-01-21 LAB — CBC WITH DIFFERENTIAL/PLATELET
BASOS ABS: 81 {cells}/uL (ref 0–200)
Basophils Relative: 1 %
EOS ABS: 243 {cells}/uL (ref 15–500)
EOS PCT: 3 %
HEMATOCRIT: 43.6 % (ref 38.5–50.0)
HEMOGLOBIN: 14.7 g/dL (ref 13.2–17.1)
LYMPHS ABS: 2835 {cells}/uL (ref 850–3900)
Lymphocytes Relative: 35 %
MCH: 30.8 pg (ref 27.0–33.0)
MCHC: 33.7 g/dL (ref 32.0–36.0)
MCV: 91.2 fL (ref 80.0–100.0)
MONO ABS: 729 {cells}/uL (ref 200–950)
MPV: 11.9 fL (ref 7.5–12.5)
Monocytes Relative: 9 %
NEUTROS ABS: 4212 {cells}/uL (ref 1500–7800)
NEUTROS PCT: 52 %
Platelets: 282 10*3/uL (ref 140–400)
RBC: 4.78 MIL/uL (ref 4.20–5.80)
RDW: 13.5 % (ref 11.0–15.0)
WBC: 8.1 10*3/uL (ref 3.8–10.8)

## 2017-01-21 LAB — HEPATIC FUNCTION PANEL
ALBUMIN: 4.4 g/dL (ref 3.6–5.1)
ALT: 27 U/L (ref 9–46)
AST: 18 U/L (ref 10–35)
Alkaline Phosphatase: 159 U/L — ABNORMAL HIGH (ref 40–115)
BILIRUBIN DIRECT: 0.1 mg/dL (ref <=0.2)
Indirect Bilirubin: 0.3 mg/dL (ref 0.2–1.2)
TOTAL PROTEIN: 8.1 g/dL (ref 6.1–8.1)
Total Bilirubin: 0.4 mg/dL (ref 0.2–1.2)

## 2017-01-21 LAB — TSH: TSH: 1.78 mIU/L (ref 0.40–4.50)

## 2017-01-21 NOTE — Patient Instructions (Signed)

## 2017-01-22 ENCOUNTER — Other Ambulatory Visit: Payer: Self-pay | Admitting: Internal Medicine

## 2017-01-22 DIAGNOSIS — E782 Mixed hyperlipidemia: Secondary | ICD-10-CM

## 2017-01-22 LAB — URIC ACID: URIC ACID, SERUM: 6.4 mg/dL (ref 4.0–8.0)

## 2017-01-22 LAB — HEMOGLOBIN A1C
HEMOGLOBIN A1C: 8.2 % — AB (ref <5.7)
MEAN PLASMA GLUCOSE: 189 mg/dL

## 2017-01-22 LAB — MAGNESIUM: MAGNESIUM: 2.2 mg/dL (ref 1.5–2.5)

## 2017-01-22 LAB — VITAMIN D 25 HYDROXY (VIT D DEFICIENCY, FRACTURES): VIT D 25 HYDROXY: 33 ng/mL (ref 30–100)

## 2017-01-22 MED ORDER — FENOFIBRATE MICRONIZED 134 MG PO CAPS: ORAL_CAPSULE | ORAL | 1 refills | Status: DC

## 2017-04-28 NOTE — Progress Notes (Signed)
Patient ID: Arthur Hudson, male   DOB: 08-22-1966, 51 y.o.   MRN: [id number]  Assessment and Plan:  Hypertension:  -Continue medication -monitor blood pressure at home. -Continue DASH diet -Reminder to go to the ER if any CP, SOB, nausea, dizziness, severe HA, changes vision/speech, left arm numbness and tingling and jaw pain.  Cholesterol - Continue diet and exercise -Check cholesterol.   Diabetes with diabetic chronic kidney disease  -Continue diet and exercise.  -Check A1C  Vitamin D Def -check level -continue medications.   Obesity with co morbidities - long discussion about weight loss, diet, and exercise   Continue diet and meds as discussed. Further disposition pending results of labs. Discussed med's effects and SE's.    HPI 51 y.o. male  presents for 3 month follow up with hypertension, hyperlipidemia, diabetes and vitamin D deficiency.   His blood pressure has been controlled at home, today their BP is BP: (!) 144/80.He does not workout. He denies chest pain, shortness of breath, dizziness.  BP Readings from Last 3 Encounters:  04/29/17 (!) 144/80  01/21/17 124/70  10/22/16 130/80    He is on cholesterol medication and denies myalgias. His cholesterol is at goal. The cholesterol was:  01/21/2017: Cholesterol 196; HDL 27; LDL Cholesterol NOT CALC; Triglycerides 1,188   He has been working on diet and exercise for diabetes with diabetic chronic kidney disease, has had DM x 2009, he is on bASA, he is on ACE/ARB, he is on invokana, levamir 14 units BID, victoza and metformin 1000mg  BID and denies  foot ulcerations, hyperglycemia, hypoglycemia , increased appetite, nausea, paresthesia of the feet, polydipsia, polyuria, visual disturbances, vomiting and weight loss.  Sugars are 101-128, Last A1C was: 01/21/2017: Hgb A1c MFr Bld 8.2.     Patient is on Vitamin D supplement. 01/21/2017: Vit D, 25-Hydroxy 33  BMI is Body mass index is 33.47 kg/m., he is working on diet and  exercise, down 5 lbs. Wt Readings from Last 3 Encounters:  04/29/17 257 lb 3.2 oz (116.7 kg)  01/21/17 256 lb 12.8 oz (116.5 kg)  10/22/16 250 lb (113.4 kg)      Current Medications:  Current Outpatient Prescriptions on File Prior to Visit  Medication Sig Dispense Refill  . allopurinol (ZYLOPRIM) 300 MG tablet TAKE 1 TABLET BY MOUTH EVERY DAY 90 tablet 1  . aspirin 81 MG tablet Take 81 mg by mouth daily.    . benazepril (LOTENSIN) 20 MG tablet TAKE 1 TABLET AT BEDTIME FOR BLOOD PRESSURE AND KIDNEY 90 tablet 1  . bisoprolol-hydrochlorothiazide (ZIAC) 5-6.25 MG tablet TAKE 1 TABLET BY MOUTH DAILY. 90 tablet 0  . Cholecalciferol (VITAMIN D) 2000 units CAPS Take 1 capsule by mouth daily.    . fenofibrate micronized (LOFIBRA) 134 MG capsule Take 1 tablet daily for blood fats 90 capsule 1  . insulin detemir (LEVEMIR) 100 UNIT/ML injection Inject 18 Units into the skin 2 (two) times daily.     . Magnesium 500 MG TABS Take by mouth daily.    . metFORMIN (GLUCOPHAGE) 1000 MG tablet TAKE 1 TABLET BY MOUTH TWICE A DAY WITH MEALS 180 tablet 2  . Multiple Vitamin (MULTIVITAMIN WITH MINERALS) TABS tablet Take 1 tablet by mouth daily.    Marland Kitchen NEEDLE, DISP, 21 G 21G X 1" MISC Inject 2 mLs as directed every 14 (fourteen) days. 100 each 1  . VICTOZA 18 MG/3ML SOPN INCREASE VICTOZA TO 1.8MG  DAILY. 27 pen 1  . vitamin B-12 (CYANOCOBALAMIN) 100 MCG tablet  Take 100 mcg by mouth daily.     No current facility-administered medications on file prior to visit.    Medical History:  Past Medical History:  Diagnosis Date  . Carpal tunnel syndrome on both sides   . Diabetes mellitus type 2, insulin dependent ([place])   . Fatty liver disease, nonalcoholic   . Gout   . Hyperlipidemia   . Hypertension   . Kidney stone   . Vitamin D deficiency    Allergies:  Allergies  Allergen Reactions  . 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 Welchol [Colesevelam Hcl]     Chest pain     Review of  Systems:  Review of Systems  Constitutional: Negative for chills, fever and malaise/fatigue.  HENT: Negative for congestion, ear pain and sore throat.   Eyes: Negative.   Respiratory: Negative for cough, shortness of breath and wheezing.   Cardiovascular: Negative for chest pain, palpitations and leg swelling.  Gastrointestinal: Negative for blood in stool, constipation, diarrhea, heartburn, melena, nausea and vomiting.  Genitourinary: Negative.   Skin: Negative.   Neurological: Negative for dizziness, sensory change, loss of consciousness and headaches.  Psychiatric/Behavioral: Negative for depression. The patient is not nervous/anxious and does not have insomnia.     Family history- Review and unchanged  Social history- Review and unchanged  Physical Exam: BP (!) 144/80   Pulse 98   Resp 16   Ht 6' 1.5" (1.867 m)   Wt 257 lb 3.2 oz (116.7 kg)   BMI 33.47 kg/m  Wt Readings from Last 3 Encounters:  04/29/17 257 lb 3.2 oz (116.7 kg)  01/21/17 256 lb 12.8 oz (116.5 kg)  10/22/16 250 lb (113.4 kg)   General Appearance: Well nourished well developed, non-toxic appearing, in no apparent distress. Eyes: PERRLA, EOMs, conjunctiva no swelling or erythema ENT/Mouth: Ear canals clear with no erythema, swelling, or discharge.  TMs normal bilaterally, oropharynx clear, moist, with no exudate.   Neck: Supple, thyroid normal, no JVD, no cervical adenopathy.  Respiratory: Respiratory effort normal, breath sounds clear A&P, no wheeze, rhonchi or rales noted.  No retractions, no accessory muscle usage Cardio: RRR with no MRGs. No noted edema.  Abdomen: Soft, + BS.  Non tender, no guarding, rebound, hernias, masses. Musculoskeletal: Full ROM, 5/5 strength, Normal gait Skin: Warm, dry without rashes, lesions, ecchymosis.  Neuro: Awake and oriented X 3, Cranial nerves intact. No cerebellar symptoms.  Psych: normal affect, Insight and Judgment appropriate.    Vicie Mutters, PA-C 3:48  PM [facility]

## 2017-04-29 ENCOUNTER — Encounter: Payer: Self-pay | Admitting: Physician Assistant

## 2017-04-29 ENCOUNTER — Ambulatory Visit (INDEPENDENT_AMBULATORY_CARE_PROVIDER_SITE_OTHER): Payer: Self-pay | Admitting: Physician Assistant

## 2017-04-29 ENCOUNTER — Ambulatory Visit: Payer: Self-pay | Admitting: Internal Medicine

## 2017-04-29 VITALS — BP 144/80 | HR 98 | Resp 16 | Ht 73.5 in | Wt 257.2 lb

## 2017-04-29 DIAGNOSIS — E1122 Type 2 diabetes mellitus with diabetic chronic kidney disease: Secondary | ICD-10-CM

## 2017-04-29 DIAGNOSIS — E559 Vitamin D deficiency, unspecified: Secondary | ICD-10-CM

## 2017-04-29 DIAGNOSIS — I10 Essential (primary) hypertension: Secondary | ICD-10-CM

## 2017-04-29 DIAGNOSIS — E349 Endocrine disorder, unspecified: Secondary | ICD-10-CM

## 2017-04-29 DIAGNOSIS — N182 Chronic kidney disease, stage 2 (mild): Secondary | ICD-10-CM

## 2017-04-29 DIAGNOSIS — E782 Mixed hyperlipidemia: Secondary | ICD-10-CM

## 2017-04-29 DIAGNOSIS — Z79899 Other long term (current) drug therapy: Secondary | ICD-10-CM

## 2017-04-29 DIAGNOSIS — I129 Hypertensive chronic kidney disease with stage 1 through stage 4 chronic kidney disease, or unspecified chronic kidney disease: Secondary | ICD-10-CM

## 2017-04-29 LAB — CBC WITH DIFFERENTIAL/PLATELET
BASOS ABS: 78 {cells}/uL (ref 0–200)
Basophils Relative: 1 %
EOS PCT: 3 %
Eosinophils Absolute: 234 cells/uL (ref 15–500)
HCT: 42.8 % (ref 38.5–50.0)
HEMOGLOBIN: 14.1 g/dL (ref 13.2–17.1)
LYMPHS ABS: 3042 {cells}/uL (ref 850–3900)
LYMPHS PCT: 39 %
MCH: 30.7 pg (ref 27.0–33.0)
MCHC: 32.9 g/dL (ref 32.0–36.0)
MCV: 93 fL (ref 80.0–100.0)
MONOS PCT: 7 %
MPV: 10.4 fL (ref 7.5–12.5)
Monocytes Absolute: 546 cells/uL (ref 200–950)
NEUTROS PCT: 50 %
Neutro Abs: 3900 cells/uL (ref 1500–7800)
PLATELETS: 276 10*3/uL (ref 140–400)
RBC: 4.6 MIL/uL (ref 4.20–5.80)
RDW: 13.6 % (ref 11.0–15.0)
WBC: 7.8 10*3/uL (ref 3.8–10.8)

## 2017-04-29 NOTE — Patient Instructions (Addendum)
Eat 3 meals a day Drink 80-100oz  Monitor your blood pressure at home. Go to the ER if any CP, SOB, nausea, dizziness, severe HA, changes vision/speech  Goal BP:  For patients younger than 60: Goal BP < 140/90. For patients 60 and older: Goal BP < 150/90. For patients with diabetes: Goal BP < 140/90. Your most recent BP: BP: (!) 144/80   Take your medications faithfully as instructed. Maintain a healthy weight. Get at least 150 minutes of aerobic exercise per week. Minimize salt intake. Minimize alcohol intake  DASH Eating Plan DASH stands for "Dietary Approaches to Stop Hypertension." The DASH eating plan is a healthy eating plan that has been shown to reduce high blood pressure (hypertension). Additional health benefits may include reducing the risk of type 2 diabetes mellitus, heart disease, and stroke. The DASH eating plan may also help with weight loss. WHAT DO I NEED TO KNOW ABOUT THE DASH EATING PLAN? For the DASH eating plan, you will follow these general guidelines:  Choose foods with a percent daily value for sodium of less than 5% (as listed on the food label).  Use salt-free seasonings or herbs instead of table salt or sea salt.  Check with your health care provider or pharmacist before using salt substitutes.  Eat lower-sodium products, often labeled as "lower sodium" or "no salt added."  Eat fresh foods.  Eat more vegetables, fruits, and low-fat dairy products.  Choose whole grains. Look for the word "whole" as the first word in the ingredient list.  Choose fish and skinless chicken or [place] more often than red meat. Limit fish, poultry, and meat to 6 oz (170 g) each day.  Limit sweets, desserts, sugars, and sugary drinks.  Choose heart-healthy fats.  Limit cheese to 1 oz (28 g) per day.  Eat more home-cooked food and less restaurant, buffet, and fast food.  Limit fried foods.  Cook foods using methods other than frying.  Limit canned vegetables. If  you do use them, rinse them well to decrease the sodium.  When eating at a restaurant, ask that your food be prepared with less salt, or no salt if possible. WHAT FOODS CAN I EAT? Seek help from a dietitian for individual calorie needs. Grains Whole grain or whole wheat bread. Brown rice. Whole grain or whole wheat pasta. Quinoa, bulgur, and whole grain cereals. Low-sodium cereals. Corn or whole wheat flour tortillas. Whole grain cornbread. Whole grain crackers. Low-sodium crackers. Vegetables Fresh or frozen vegetables (raw, steamed, roasted, or grilled). Low-sodium or reduced-sodium tomato and vegetable juices. Low-sodium or reduced-sodium tomato sauce and paste. Low-sodium or reduced-sodium canned vegetables.  Fruits All fresh, canned (in natural juice), or frozen fruits. Meat and Other Protein Products Ground beef (85% or leaner), grass-fed beef, or beef trimmed of fat. Skinless chicken or [place]. Ground chicken or [place]. Pork trimmed of fat. All fish and seafood. Eggs. Dried beans, peas, or lentils. Unsalted nuts and seeds. Unsalted canned beans. Dairy Low-fat dairy products, such as skim or 1% milk, 2% or reduced-fat cheeses, low-fat ricotta or cottage cheese, or plain low-fat yogurt. Low-sodium or reduced-sodium cheeses. Fats and Oils Tub margarines without trans fats. Light or reduced-fat mayonnaise and salad dressings (reduced sodium). Avocado. Safflower, olive, or canola oils. Natural peanut or almond butter. Other Unsalted popcorn and pretzels. The items listed above may not be a complete list of recommended foods or beverages. Contact your dietitian for more options. WHAT FOODS ARE NOT RECOMMENDED? Grains White bread. White pasta. White rice.  Refined cornbread. Bagels and croissants. Crackers that contain trans fat. Vegetables Creamed or fried vegetables. Vegetables in a cheese sauce. Regular canned vegetables. Regular canned tomato sauce and paste. Regular tomato and vegetable  juices. Fruits Dried fruits. Canned fruit in light or heavy syrup. Fruit juice. Meat and Other Protein Products Fatty cuts of meat. Ribs, chicken wings, bacon, sausage, bologna, salami, chitterlings, fatback, hot dogs, bratwurst, and packaged luncheon meats. Salted nuts and seeds. Canned beans with salt. Dairy Whole or 2% milk, cream, half-and-half, and cream cheese. Whole-fat or sweetened yogurt. Full-fat cheeses or blue cheese. Nondairy creamers and whipped toppings. Processed cheese, cheese spreads, or cheese curds. Condiments Onion and garlic salt, seasoned salt, table salt, and sea salt. Canned and packaged gravies. Worcestershire sauce. Tartar sauce. Barbecue sauce. Teriyaki sauce. Soy sauce, including reduced sodium. Steak sauce. Fish sauce. Oyster sauce. Cocktail sauce. Horseradish. Ketchup and mustard. Meat flavorings and tenderizers. Bouillon cubes. Hot sauce. Tabasco sauce. Marinades. Taco seasonings. Relishes. Fats and Oils Butter, stick margarine, lard, shortening, ghee, and bacon fat. Coconut, palm kernel, or palm oils. Regular salad dressings. Other Pickles and olives. Salted popcorn and pretzels. The items listed above may not be a complete list of foods and beverages to avoid. Contact your dietitian for more information. WHERE CAN I FIND MORE INFORMATION? National Heart, Lung, and Blood Institute: travelstabloid.com Document Released: 10/18/2011 Document Revised: 03/15/2014 Document Reviewed: 09/02/2013 [facility] Patient Information 2015 [place], [place]. This information is not intended to replace advice given to you by your health care provider. Make sure you discuss any questions you have with your health care provider.  Diabetes is a very complicated disease...lets simplify it.  An easy way to look at it to understand the complications is if you think of the extra sugar floating in your blood stream as glass shards floating through your blood  stream.    Diabetes affects your small vessels first: 1) The glass shards (sugar) scraps down the tiny blood vessels in your eyes and lead to diabetic retinopathy, the leading cause of blindness in the [place]. Diabetes is the leading cause of newly diagnosed adult (13 to 53 years of age) blindness in the [place].  2) The glass shards scratches down the tiny vessels of your legs leading to nerve damage called neuropathy and can lead to amputations of your feet. More than 60% of all non-traumatic amputations of lower limbs occur in people with diabetes.  3) Over time the small vessels in your brain are shredded and closed off, individually this does not cause any problems but over a long period of time many of the small vessels being blocked can lead to Vascular Dementia.   4) Your kidney's are a filter system and have a "net" that keeps certain things in the body and lets bad things out. Sugar shreds this net and leads to kidney damage and eventually failure. Decreasing the sugar that is destroying the net and certain blood pressure medications can help stop or decrease progression of kidney disease. Diabetes was the primary cause of kidney failure in 44 percent of all new cases in 2011.  5) Diabetes also destroys the small vessels in your penis that lead to erectile dysfunction. Eventually the vessels are so damaged that you may not be responsive to cialis or viagra.   Diabetes and your large vessels: Your larger vessels consist of your coronary arteries in your heart and the carotid vessels to your brain. Diabetes or even increased sugars put you at 300% increased risk  of heart attack and stroke and this is why.. The sugar scrapes down your large blood vessels and your body sees this as an internal injury and tries to repair itself. Just like you get a scab on your skin, your platelets will stick to the blood vessel wall trying to heal it. This is why we have diabetics on low dose aspirin daily,  this prevents the platelets from sticking and can prevent plaque formation. In addition, your body takes cholesterol and tries to shove it into the open wound. This is why we want your LDL, or bad cholesterol, below 70.   The combination of platelets and cholesterol over 5-10 years forms plaque that can break off and cause a heart attack or stroke.   PLEASE REMEMBER:  Diabetes is preventable! Up to 56 percent of complications and morbidities among individuals with type 2 diabetes can be prevented, delayed, or effectively treated and minimized with regular visits to a health professional, appropriate monitoring and medication, and a healthy diet and lifestyle.     Bad carbs also include fruit juice, alcohol, and sweet tea. These are empty calories that do not signal to your brain that you are full.   Please remember the good carbs are still carbs which convert into sugar. So please measure them out no more than 1/2-1 cup of rice, oatmeal, pasta, and beans  Veggies are however free foods! Pile them on.   Not all fruit is created equal. Please see the list below, the fruit at the bottom is higher in sugars than the fruit at the top. Please avoid all dried fruits.

## 2017-04-30 LAB — HEMOGLOBIN A1C
HEMOGLOBIN A1C: 8.5 % — AB (ref <5.7)
MEAN PLASMA GLUCOSE: 197 mg/dL

## 2017-04-30 LAB — BASIC METABOLIC PANEL WITH GFR
BUN: 24 mg/dL (ref 7–25)
CALCIUM: 9.6 mg/dL (ref 8.6–10.3)
CO2: 21 mmol/L (ref 20–31)
CREATININE: 1.27 mg/dL (ref 0.70–1.33)
Chloride: 101 mmol/L (ref 98–110)
GFR, EST AFRICAN AMERICAN: 75 mL/min (ref >=60)
GFR, EST NON AFRICAN AMERICAN: 65 mL/min (ref >=60)
Glucose, Bld: 252 mg/dL — ABNORMAL HIGH (ref 65–99)
Potassium: 4.7 mmol/L (ref 3.5–5.3)
SODIUM: 138 mmol/L (ref 135–146)

## 2017-04-30 LAB — LIPID PANEL
Cholesterol: 177 mg/dL (ref <200)
HDL: 33 mg/dL — ABNORMAL LOW (ref >40)
Total CHOL/HDL Ratio: 5.4 Ratio — ABNORMAL HIGH (ref <5.0)
Triglycerides: 502 mg/dL — ABNORMAL HIGH (ref <150)

## 2017-04-30 NOTE — Progress Notes (Signed)
Pt aware of lab results & voiced understanding of those results. Pt transferred to front to schedule 1 mth DM visit.

## 2017-05-20 ENCOUNTER — Other Ambulatory Visit: Payer: Self-pay | Admitting: Internal Medicine

## 2017-06-07 NOTE — Progress Notes (Signed)
Diabetes Education and Follow-Up Visit  51 y.o.male presents for diabetic education.  Patient denies foot ulcerations, hypoglycemia , paresthesia of the feet, polydipsia, polyuria and visual disturbances. The patient is on an ACE/ARB, he is on a low dose aspirin at this time. The patient does not exercise.  The patient is checking his sugars at home, running 122-158.  Has freestyle and one touch at home.  He has stopped juice, uses splenda in coffee.   He is on victoza, metformin, levemir 14 units a day, he was off victoza due to cost but is now back on it and states his sugar is doing better.  Lab Results  Component Value Date   HGBA1C 8.5 (H) 04/29/2017   Lab Results  Component Value Date   CHOL 177 04/29/2017   HDL 33 (L) 04/29/2017   LDLCALC NOT CALC 04/29/2017   TRIG 502 (H) 04/29/2017   CHOLHDL 5.4 (H) 04/29/2017   BMI is Body mass index is 33.19 kg/m., he is working on diet and exercise. 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  Medications Current Outpatient Prescriptions on File Prior to Visit  Medication Sig  . allopurinol (ZYLOPRIM) 300 MG tablet TAKE 1 TABLET BY MOUTH EVERY DAY  . aspirin 81 MG tablet Take 81 mg by mouth daily.  . benazepril (LOTENSIN) 20 MG tablet TAKE 1 TABLET AT BEDTIME FOR BLOOD PRESSURE AND KIDNEY  . bisoprolol-hydrochlorothiazide (ZIAC) 5-6.25 MG tablet TAKE 1 TABLET BY MOUTH DAILY.  Marland Kitchen Cholecalciferol (VITAMIN D) 2000 units CAPS Take 1 capsule by mouth daily.  . fenofibrate micronized (LOFIBRA) 134 MG capsule Take 1 tablet daily for blood fats  . insulin detemir (LEVEMIR) 100 UNIT/ML  injection Inject 18 Units into the skin 2 (two) times daily.   . Magnesium 500 MG TABS Take by mouth daily.  . metFORMIN (GLUCOPHAGE) 1000 MG tablet TAKE 1 TABLET BY MOUTH TWICE A DAY WITH MEALS  . Multiple Vitamin (MULTIVITAMIN WITH MINERALS) TABS tablet Take 1 tablet by mouth daily.  Marland Kitchen NEEDLE, DISP, 21 G 21G X 1" MISC Inject 2 mLs as directed every 14 (fourteen) days.  Marland Kitchen VICTOZA 18 MG/3ML SOPN INCREASE VICTOZA TO 1.8MG  DAILY.  . vitamin B-12 (CYANOCOBALAMIN) 100 MCG tablet Take 100 mcg by mouth daily.   No current facility-administered medications on file prior to visit.     ROS: no polyuria or polydipsia, no chest pain, dyspnea or TIA's, no numbness, tingling or pain in extremities  Physical Exam: Blood pressure 138/88, pulse 83, temperature 97.7 F (36.5 C), resp. rate 16, height 6' 1.5" (1.867 m), weight 255 lb (115.7 kg), SpO2 98 %. Body mass index is 33.19 kg/m. General Appearance:  alert, oriented, no acute distress heart sounds regular rate and rhythm, S1, S2 normal, no murmur, click, rub or gallop, no hepatosplenomegaly, feet: normal DP and PT pulses, no trophic changes or ulcerative lesions and normal sensory exam  Labs: Lab Results  Component Value Date   HGBA1C 8.5 (H) 04/29/2017    Lab Results  Component Value Date   MICROALBUR 0.6 06/25/2016    Lab Results  Component Value Date   CHOL 177 04/29/2017  HDL 33 (L) 04/29/2017   LDLCALC NOT CALC 04/29/2017   TRIG 502 (H) 04/29/2017   CHOLHDL 5.4 (H) 04/29/2017     Plan and Assessment: Diabetes Mellitus type 2:  with CKD stage 2 GFR 60-89 Education: Reviewed 'ABCs' of diabetes management (respective goals in parentheses):  A1C (<7), blood pressure (<130/80), and cholesterol (LDL <100) Eye Exam yearly and Dental Exam every 6 months. Dietary recommendations Physical Activity recommendations   Blood pressure: normal blood pressure.   An ACE/ARB is currently part of their treatment regimen.  LDL goal of < 100,  HDL > 40 and TG < 150. Dyslipidemia under poor control. A statin is not currently part of their treatment regimen.  Future Appointments Date Time Provider [place]  08/19/2017 3:00 PM Unk Pinto, MD [facility] None

## 2017-06-10 ENCOUNTER — Ambulatory Visit (INDEPENDENT_AMBULATORY_CARE_PROVIDER_SITE_OTHER): Payer: Self-pay | Admitting: Physician Assistant

## 2017-06-10 ENCOUNTER — Encounter: Payer: Self-pay | Admitting: Physician Assistant

## 2017-06-10 VITALS — BP 138/88 | HR 83 | Temp 97.7°F | Resp 16 | Ht 73.5 in | Wt 255.0 lb

## 2017-06-10 DIAGNOSIS — E1122 Type 2 diabetes mellitus with diabetic chronic kidney disease: Secondary | ICD-10-CM | POA: Diagnosis not present

## 2017-06-10 DIAGNOSIS — I129 Hypertensive chronic kidney disease with stage 1 through stage 4 chronic kidney disease, or unspecified chronic kidney disease: Secondary | ICD-10-CM

## 2017-06-10 DIAGNOSIS — N182 Chronic kidney disease, stage 2 (mild): Secondary | ICD-10-CM

## 2017-06-10 DIAGNOSIS — E782 Mixed hyperlipidemia: Secondary | ICD-10-CM

## 2017-06-10 DIAGNOSIS — I10 Essential (primary) hypertension: Secondary | ICD-10-CM

## 2017-06-10 MED ORDER — INSULIN GLARGINE-LIXISENATIDE 100-33 UNT-MCG/ML ~~LOC~~ SOPN: 30.0000 [IU] | PEN_INJECTOR | Freq: Every day | SUBCUTANEOUS | 3 refills | Status: DC

## 2017-06-10 NOTE — Patient Instructions (Addendum)
Stop the victoza and the levemir Start on Bascom which has both medications in it Start on 15units in the morning once daily  Increase by 2 units every 3 days if your morning sugar is higher than 150 Call the office if you get any low sugars.   Drink 80-100 oz a day of water, measure it out Eat 3 meals a day, have to do breakfast, eat protein- hard boiled eggs, protein bar like nature valley protein bar, greek yogurt like oikos triple zero, chobani 100, or light n fit greek Continue to push more veggies   Your A1C is a measure of your sugar over the past 3 months and is not affected by what you have eaten over the past few days. Diabetes increases your chances of stroke and heart attack over 300 % and is the leading cause of blindness and kidney failure in the [place]. Please make sure you decrease bad carbs like white bread, white rice, potatoes, corn, soft drinks, pasta, cereals, refined sugars, sweet tea, dried fruits, and fruit juice. Good carbs are okay to eat in moderation like sweet potatoes, brown rice, whole grain pasta/bread, most fruit (except dried fruit) and you can eat as many veggies as you want.   Greater than 6.5 is considered diabetic. Between 6.4 and 5.7 is prediabetic If your A1C is less than 5.7 you are NOT diabetic.  Targets for Glucose Readings: Time of Check Target for patients WITHOUT Diabetes Target for DIABETICS  Before Meals Less than 100  less than 150  Two hours after meals Less than 200  Less than 250   If your morning sugar is always below 100 then the issue is with your sugar spiking after meals. Try to take your blood sugar approximately 2 hours after eating, this number should be less than 200. If it is not, think about the foods that you ate and better choices you can make.      Bad carbs also include fruit juice, alcohol, and sweet tea. These are empty calories that do not signal to your brain that you are full.   Please remember the good carbs  are still carbs which convert into sugar. So please measure them out no more than 1/2-1 cup of rice, oatmeal, pasta, and beans  Veggies are however free foods! Pile them on.   Not all fruit is created equal. Please see the list below, the fruit at the bottom is higher in sugars than the fruit at the top. Please avoid all dried fruits.

## 2017-08-07 ENCOUNTER — Encounter: Payer: Self-pay | Admitting: Internal Medicine

## 2017-08-19 ENCOUNTER — Encounter: Payer: Self-pay | Admitting: Internal Medicine

## 2017-09-17 ENCOUNTER — Ambulatory Visit (INDEPENDENT_AMBULATORY_CARE_PROVIDER_SITE_OTHER): Payer: No Typology Code available for payment source | Admitting: Internal Medicine

## 2017-09-17 ENCOUNTER — Encounter: Payer: Self-pay | Admitting: Internal Medicine

## 2017-09-17 VITALS — BP 124/76 | HR 76 | Temp 97.5°F | Resp 18 | Ht 73.5 in | Wt 257.8 lb

## 2017-09-17 DIAGNOSIS — Z1211 Encounter for screening for malignant neoplasm of colon: Secondary | ICD-10-CM

## 2017-09-17 DIAGNOSIS — E559 Vitamin D deficiency, unspecified: Secondary | ICD-10-CM | POA: Diagnosis not present

## 2017-09-17 DIAGNOSIS — Z79899 Other long term (current) drug therapy: Secondary | ICD-10-CM | POA: Diagnosis not present

## 2017-09-17 DIAGNOSIS — Z0000 Encounter for general adult medical examination without abnormal findings: Secondary | ICD-10-CM | POA: Diagnosis not present

## 2017-09-17 DIAGNOSIS — Z0001 Encounter for general adult medical examination with abnormal findings: Secondary | ICD-10-CM

## 2017-09-17 DIAGNOSIS — R5383 Other fatigue: Secondary | ICD-10-CM

## 2017-09-17 DIAGNOSIS — E782 Mixed hyperlipidemia: Secondary | ICD-10-CM

## 2017-09-17 DIAGNOSIS — F17200 Nicotine dependence, unspecified, uncomplicated: Secondary | ICD-10-CM

## 2017-09-17 DIAGNOSIS — I10 Essential (primary) hypertension: Secondary | ICD-10-CM

## 2017-09-17 DIAGNOSIS — Z23 Encounter for immunization: Secondary | ICD-10-CM | POA: Diagnosis not present

## 2017-09-17 DIAGNOSIS — Z1212 Encounter for screening for malignant neoplasm of rectum: Secondary | ICD-10-CM

## 2017-09-17 DIAGNOSIS — Z125 Encounter for screening for malignant neoplasm of prostate: Secondary | ICD-10-CM | POA: Diagnosis not present

## 2017-09-17 DIAGNOSIS — E1022 Type 1 diabetes mellitus with diabetic chronic kidney disease: Secondary | ICD-10-CM

## 2017-09-17 DIAGNOSIS — Z136 Encounter for screening for cardiovascular disorders: Secondary | ICD-10-CM | POA: Diagnosis not present

## 2017-09-17 DIAGNOSIS — Z794 Long term (current) use of insulin: Secondary | ICD-10-CM

## 2017-09-17 DIAGNOSIS — E349 Endocrine disorder, unspecified: Secondary | ICD-10-CM

## 2017-09-17 DIAGNOSIS — N182 Chronic kidney disease, stage 2 (mild): Secondary | ICD-10-CM

## 2017-09-17 DIAGNOSIS — E119 Type 2 diabetes mellitus without complications: Secondary | ICD-10-CM

## 2017-09-17 DIAGNOSIS — M1000 Idiopathic gout, unspecified site: Secondary | ICD-10-CM

## 2017-09-17 NOTE — Progress Notes (Signed)
[facility]   Unk Pinto, M.D.     Uvaldo Bristle. Silverio Lay, P.A.-C Liane Comber, [address]                [place] [address] Telephone [phone] Telefax [phone] Annual  Screening/Preventative Visit  & Comprehensive Evaluation & Examination     This very nice 51 y.o. DBM presents for a Screening/Preventative Visit & comprehensive evaluation and management of multiple medical co-morbidities.  Patient has been followed for HTN, insulin requiring T2_IDDM,  Hyperlipidemia and Vitamin D Deficiency. Patient's Gout is quiescent on Allopurinol.     HTN predates circa 2007 and his BP has been controlled at home.  Today's BP is at goal - 124/76. Patient denies any cardiac symptoms as chest pain, palpitations, shortness of breath, dizziness or ankle swelling.     Patient's hyperlipidemia is controlled with diet and medications. Patient denies myalgias or other medication SE's. Last lipids were not at goal:  Lab Results  Component Value Date   CHOL 177 04/29/2017   HDL 33 (L) 04/29/2017   LDLCALC NOT CALC 04/29/2017   TRIG 502 (H) 04/29/2017   CHOLHDL 5.4 (H) 04/29/2017      Patient has  Morbid Obesity (BMI 32+) and T2_IDDM (2007) and compliance / control despite a multi-drug regimen of MF, Insulin, - has historically been poor. He had been rx'd  Damon, but not taken due to expense.  Patient denies reactive hypoglycemic symptoms, visual blurring, diabetic polys or paresthesias. Last A1c was not at goal: Lab Results  Component Value Date   HGBA1C 8.5 (H) 04/29/2017       Finally, patient has history of Vitamin D Deficiency ("23" / 2008)  and last vitamin D was still very low: Lab Results  Component Value Date   VD25OH 33 01/21/2017   Current Outpatient Medications on File Prior to Visit  Medication Sig  . allopurinol (ZYLOPRIM) 300 MG tablet TAKE 1 TABLET BY MOUTH  EVERY DAY  . aspirin 81 MG tablet Take 81 mg by mouth daily.  . benazepril (LOTENSIN) 20 MG tablet TAKE 1 TABLET AT BEDTIME FOR BLOOD PRESSURE AND KIDNEY  . bisoprolol-hydrochlorothiazide (ZIAC) 5-6.25 MG tablet TAKE 1 TABLET BY MOUTH DAILY.  Marland Kitchen Cholecalciferol (VITAMIN D) 2000 units CAPS Take 1 capsule by mouth daily.  . insulin detemir (LEVEMIR) 100 UNIT/ML injection Inject 18 Units into the skin 2 (two) times daily.   . Magnesium 500 MG TABS Take by mouth daily.  . metFORMIN (GLUCOPHAGE) 1000 MG tablet TAKE 1 TABLET BY MOUTH TWICE A DAY WITH MEALS  . Multiple Vitamin (MULTIVITAMIN WITH MINERALS) TABS tablet Take 1 tablet by mouth daily.  . vitamin B-12 (CYANOCOBALAMIN) 100 MCG tablet Take 100 mcg by mouth daily.  . fenofibrate micronized (LOFIBRA) 134 MG capsule Take 1 tablet daily for blood fats  . Insulin Glargine-Lixisenatide (SOLIQUA) 100-33 UNT-MCG/ML SOPN Inject 30 Units into the skin daily. Or as directed by your doctor (Patient not taking: Reported on 09/17/2017)  . VICTOZA 18 MG/3ML SOPN INCREASE VICTOZA TO 1.8MG  DAILY. (Patient not taking: Reported on 09/17/2017)   No current facility-administered medications on file prior to visit.    Allergies  Allergen Reactions  . 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 Welchol [Colesevelam Hcl]     Chest pain  Past Medical History:  Diagnosis Date  . Carpal tunnel syndrome on both sides   . Diabetes mellitus type 2, insulin dependent ([place])   . Fatty liver disease, nonalcoholic   . Gout   . Hyperlipidemia   . Hypertension   . Kidney stone   . Vitamin D deficiency    Health Maintenance  Topic Date Due  . COLONOSCOPY  10/26/2015  . OPHTHALMOLOGY EXAM  02/14/2016  . TETANUS/TDAP  11/22/2016  . INFLUENZA VACCINE  06/12/2017  . FOOT EXAM  06/25/2017  . HEMOGLOBIN A1C  10/29/2017  . PNEUMOCOCCAL POLYSACCHARIDE VACCINE  Completed  . HIV Screening  Completed   Immunization History  Administered Date(s)  Administered  . Influenza Inj Mdck Quad With Preservative 09/17/2017  . Influenza Split 09/05/2015  . Influenza,inj,quad, With Preservative 10/22/2016  . Pneumococcal Polysaccharide-23 09/05/2015  . Pneumococcal-Unspecified 11/22/2009  . Td 09/17/2017  . Tdap 11/22/2006   No past surgical history on file. Family History  Problem Relation Age of Onset  . Hypertension Mother   . Diabetes Mother   . Asthma Mother   . Diabetes Father   . Hypertension Father   . Stroke Father   . Hypertension Sister   . Hyperlipidemia Sister    Occupational History  . Cook  Tobacco Use  . Smoking status: Current Some Day Smoker    Types: Cigars  . Smokeless tobacco: Never Used  Substance and Sexual Activity  . Alcohol use: Yes    Comment: social  . Drug use: No  . Sexual activity: Not on file  Social History Narrative  . Not on file    ROS Constitutional: Denies fever, chills, weight loss/gain, headaches, insomnia,  night sweats or change in appetite. Does c/o fatigue. Eyes: Denies redness, blurred vision, diplopia, discharge, itchy or watery eyes.  ENT: Denies discharge, congestion, post nasal drip, epistaxis, sore throat, earache, hearing loss, dental pain, Tinnitus, Vertigo, Sinus pain or snoring.  Cardio: Denies chest pain, palpitations, irregular heartbeat, syncope, dyspnea, diaphoresis, orthopnea, PND, claudication or edema Respiratory: denies cough, dyspnea, DOE, pleurisy, hoarseness, laryngitis or wheezing.  Gastrointestinal: Denies dysphagia, heartburn, reflux, water brash, pain, cramps, nausea, vomiting, bloating, diarrhea, constipation, hematemesis, melena, hematochezia, jaundice or hemorrhoids Genitourinary: Denies dysuria, frequency, urgency, nocturia, hesitancy, discharge, hematuria or flank pain Musculoskeletal: Denies arthralgia, myalgia, stiffness, Jt. Swelling, pain, limp or strain/sprain. Denies Falls. Skin: Denies puritis, rash, hives, warts, acne, eczema or change in skin  lesion Neuro: No weakness, tremor, incoordination, spasms, paresthesia or pain Psychiatric: Denies confusion, memory loss or sensory loss. Denies Depression. Endocrine: Denies change in weight, skin, hair change, nocturia, and paresthesia, diabetic polys, visual blurring or hyper / hypo glycemic episodes.  Heme/Lymph: No excessive bleeding, bruising or enlarged lymph nodes.  Physical Exam  BP 124/76   Pulse 76   Temp (!) 97.5 F (36.4 C)   Resp 18   Ht 6' 1.5" (1.867 m)   Wt 257 lb 12.8 oz (116.9 kg)   BMI 33.55 kg/m   General Appearance: Well nourished and well groomed and in no apparent distress.  Eyes: PERRLA, EOMs, conjunctiva no swelling or erythema, normal fundi and vessels. Sinuses: No frontal/maxillary tenderness ENT/Mouth: EACs patent / TMs  nl. Nares clear without erythema, swelling, mucoid exudates. Oral hygiene is good. No erythema, swelling, or exudate. Tongue normal, non-obstructing. Tonsils not swollen or erythematous. Hearing normal.  Neck: Supple, thyroid normal. No bruits, nodes or JVD. Respiratory: Respiratory effort normal.  BS equal and clear bilateral without rales, rhonci, wheezing or  stridor. Cardio: Heart sounds are normal with regular rate and rhythm and no murmurs, rubs or gallops. Peripheral pulses are normal and equal bilaterally without edema. No aortic or femoral bruits. Chest: symmetric with normal excursions and percussion.  Abdomen: Soft, with Nl bowel sounds. Nontender, no guarding, rebound, hernias, masses, or organomegaly.  Lymphatics: Non tender without lymphadenopathy.  Genitourinary: No hernias.Testes nl. DRE - prostate nl for age - smooth & firm w/o nodules. Musculoskeletal: Full ROM all peripheral extremities, joint stability, 5/5 strength, and normal gait. Skin: Warm and dry without rashes, lesions, cyanosis, clubbing or  ecchymosis.  Neuro: Cranial nerves intact, reflexes equal bilaterally. Normal muscle tone, no cerebellar symptoms.  Sensation intact to touch, vibratory and Monofilament to the toes bilaterally. Pysch: Alert and oriented X 3 with normal affect, insight and judgment appropriate.   Assessment and Plan  1. Annual Preventative/Screening Exam   2. Essential hypertension  - EKG 12-Lead - [place], RETROPERITNL ABD,  LTD - Urinalysis, Routine w reflex microscopic - Microalbumin / creatinine urine ratio - CBC with Differential/Platelet - BASIC METABOLIC PANEL WITH GFR - Magnesium - TSH  3. Hyperlipidemia, mixed  - EKG 12-Lead - [place], RETROPERITNL ABD,  LTD - Hepatic function panel - Lipid panel - TSH  4. Insulin-requiring or dependent type II diabetes mellitus ([place])  - EKG 12-Lead - [place], RETROPERITNL ABD,  LTD - Urinalysis, Routine w reflex microscopic - Microalbumin / creatinine urine ratio - HM DIABETES FOOT EXAM - LOW EXTREMITY NEUR EXAM DOCUM - Hemoglobin A1c  5. Vitamin D deficiency  - VITAMIN D 25 Hydroxy (Vit-D Deficiency, Fractures)  6. 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 Testosterone deficiency  - Testosterone  8. Idiopathic gout  - Uric acid  9. Screening for colorectal cancer  - POC Hemoccult Bld/Stl   10. Prostate cancer screening  - PSA  11. Screening for ischemic heart disease  - EKG 12-Lead  12. Screening for AAA (aortic abdominal aneurysm)  - [place], RETROPERITNL ABD,  LTD  13. Smoker   14. Fatigue  - Iron,Total/Total Iron Binding Cap - Vitamin B12 - CBC with Differential/Platelet  15. Medication management  - Urinalysis, Routine w reflex microscopic - Microalbumin / creatinine urine ratio - CBC with Differential/Platelet - BASIC METABOLIC PANEL WITH GFR - Hepatic function panel - Magnesium - Lipid panel - TSH - Hemoglobin A1c - VITAMIN D 25 Hydroxy  - Uric acid  16. Need  for immunization against influenza  - FLU VACCINE MDCK QUAD W/Preservative  17. Need for prophylactic vaccination with tetanus-diphtheria (Td)  - Td : Tetanus/diphtheria >7yo Preservative  free         Patient was counseled in prudent diet, weight control to achieve/maintain BMI less than 25, BP monitoring, regular exercise and medications as discussed.  Discussed med effects and SE's. Routine screening labs and tests as requested with regular follow-up as recommended. Over 40 minutes of exam, counseling, chart review and high complex critical decision making was performed

## 2017-09-17 NOTE — Patient Instructions (Signed)

## 2017-09-18 ENCOUNTER — Other Ambulatory Visit: Payer: Self-pay | Admitting: Internal Medicine

## 2017-09-18 DIAGNOSIS — E782 Mixed hyperlipidemia: Secondary | ICD-10-CM

## 2017-09-18 LAB — URINALYSIS, ROUTINE W REFLEX MICROSCOPIC
BACTERIA UA: NONE SEEN /HPF
BILIRUBIN URINE: NEGATIVE
Hgb urine dipstick: NEGATIVE
Hyaline Cast: NONE SEEN /LPF
Ketones, ur: NEGATIVE
Leukocytes, UA: NEGATIVE
NITRITE: NEGATIVE
RBC / HPF: NONE SEEN /HPF (ref 0–2)
SPECIFIC GRAVITY, URINE: 1.023 (ref 1.001–1.03)
SQUAMOUS EPITHELIAL / LPF: NONE SEEN /HPF (ref <=5)
WBC, UA: NONE SEEN /HPF (ref 0–5)
pH: <=5 (ref 5.0–8.0)

## 2017-09-18 LAB — LIPID PANEL
CHOL/HDL RATIO: 6.4 (calc) — AB (ref <5.0)
CHOLESTEROL: 199 mg/dL (ref <200)
HDL: 31 mg/dL — AB (ref >40)
Non-HDL Cholesterol (Calc): 168 mg/dL (calc) — ABNORMAL HIGH (ref <130)
TRIGLYCERIDES: 951 mg/dL — AB (ref <150)

## 2017-09-18 LAB — CBC WITH DIFFERENTIAL/PLATELET
BASOS PCT: 1.5 %
Basophils Absolute: 101 cells/uL (ref 0–200)
EOS PCT: 3.1 %
Eosinophils Absolute: 208 cells/uL (ref 15–500)
HEMATOCRIT: 44.9 % (ref 38.5–50.0)
HEMOGLOBIN: 15.3 g/dL (ref 13.2–17.1)
LYMPHS ABS: 2653 {cells}/uL (ref 850–3900)
MCH: 30.6 pg (ref 27.0–33.0)
MCHC: 34.1 g/dL (ref 32.0–36.0)
MCV: 89.8 fL (ref 80.0–100.0)
MPV: 11.1 fL (ref 7.5–12.5)
Monocytes Relative: 8.8 %
NEUTROS ABS: 3149 {cells}/uL (ref 1500–7800)
Neutrophils Relative %: 47 %
Platelets: 274 10*3/uL (ref 140–400)
RBC: 5 10*6/uL (ref 4.20–5.80)
RDW: 12.6 % (ref 11.0–15.0)
Total Lymphocyte: 39.6 %
WBC mixed population: 590 cells/uL (ref 200–950)
WBC: 6.7 10*3/uL (ref 3.8–10.8)

## 2017-09-18 LAB — VITAMIN D 25 HYDROXY (VIT D DEFICIENCY, FRACTURES): Vit D, 25-Hydroxy: 35 ng/mL (ref 30–100)

## 2017-09-18 LAB — HEMOGLOBIN A1C
Hgb A1c MFr Bld: 10.6 % of total Hgb — ABNORMAL HIGH (ref <5.7)
MEAN PLASMA GLUCOSE: 258 (calc)
eAG (mmol/L): 14.3 (calc)

## 2017-09-18 LAB — BASIC METABOLIC PANEL WITH GFR
BUN: 16 mg/dL (ref 7–25)
CALCIUM: 9.9 mg/dL (ref 8.6–10.3)
CHLORIDE: 96 mmol/L — AB (ref 98–110)
CO2: 25 mmol/L (ref 20–32)
CREATININE: 0.88 mg/dL (ref 0.70–1.33)
GFR, Est African American: 115 mL/min/{1.73_m2} (ref >=60)
GFR, Est Non African American: 99 mL/min/{1.73_m2} (ref >=60)
Glucose, Bld: 267 mg/dL — ABNORMAL HIGH (ref 65–99)
Potassium: 4.4 mmol/L (ref 3.5–5.3)
Sodium: 133 mmol/L — ABNORMAL LOW (ref 135–146)

## 2017-09-18 LAB — VITAMIN B12: Vitamin B-12: 1530 pg/mL — ABNORMAL HIGH (ref 200–1100)

## 2017-09-18 LAB — HEPATIC FUNCTION PANEL
AG RATIO: 1.2 (calc) (ref 1.0–2.5)
ALBUMIN MSPROF: 4.4 g/dL (ref 3.6–5.1)
ALT: 28 U/L (ref 9–46)
AST: 19 U/L (ref 10–35)
Alkaline phosphatase (APISO): 105 U/L (ref 40–115)
Bilirubin, Direct: 0.1 mg/dL (ref 0.0–0.2)
GLOBULIN: 3.8 g/dL — AB (ref 1.9–3.7)
Indirect Bilirubin: 0.4 mg/dL (calc) (ref 0.2–1.2)
TOTAL PROTEIN: 8.2 g/dL — AB (ref 6.1–8.1)
Total Bilirubin: 0.5 mg/dL (ref 0.2–1.2)

## 2017-09-18 LAB — IRON, TOTAL/TOTAL IRON BINDING CAP
%SAT: 23 % (calc) (ref 15–60)
IRON: 76 ug/dL (ref 50–180)
TIBC: 331 ug/dL (ref 250–425)

## 2017-09-18 LAB — PSA: PSA: 0.3 ng/mL (ref <=4.0)

## 2017-09-18 LAB — MAGNESIUM: Magnesium: 1.8 mg/dL (ref 1.5–2.5)

## 2017-09-18 LAB — MICROALBUMIN / CREATININE URINE RATIO
Creatinine, Urine: 158 mg/dL (ref 20–320)
MICROALB UR: 46 mg/dL
Microalb Creat Ratio: 291 mcg/mg creat — ABNORMAL HIGH (ref <30)

## 2017-09-18 LAB — TSH: TSH: 1.5 mIU/L (ref 0.40–4.50)

## 2017-09-18 LAB — TESTOSTERONE: Testosterone: 329 ng/dL (ref 250–827)

## 2017-09-18 LAB — URIC ACID: URIC ACID, SERUM: 9.6 mg/dL — AB (ref 4.0–8.0)

## 2017-09-18 MED ORDER — FENOFIBRATE MICRONIZED 134 MG PO CAPS: ORAL_CAPSULE | ORAL | 1 refills | Status: DC

## 2017-09-18 MED ORDER — METFORMIN HCL 1000 MG PO TABS: ORAL_TABLET | ORAL | 2 refills | Status: DC

## 2017-09-20 ENCOUNTER — Other Ambulatory Visit: Payer: Self-pay | Admitting: Internal Medicine

## 2017-12-15 ENCOUNTER — Other Ambulatory Visit: Payer: Self-pay | Admitting: Physician Assistant

## 2018-01-05 ENCOUNTER — Other Ambulatory Visit: Payer: Self-pay | Admitting: Internal Medicine

## 2018-01-06 DIAGNOSIS — N181 Chronic kidney disease, stage 1: Secondary | ICD-10-CM | POA: Insufficient documentation

## 2018-01-06 DIAGNOSIS — E1122 Type 2 diabetes mellitus with diabetic chronic kidney disease: Secondary | ICD-10-CM | POA: Insufficient documentation

## 2018-01-06 DIAGNOSIS — N182 Chronic kidney disease, stage 2 (mild): Secondary | ICD-10-CM

## 2018-01-06 NOTE — Progress Notes (Deleted)
FOLLOW UP  Assessment and Plan:   Hypertension Well controlled with current medications  Monitor blood pressure at home; patient to call if consistently greater than 130/80 Continue DASH diet.   Reminder to go to the ER if any CP, SOB, nausea, dizziness, severe HA, changes vision/speech, left arm numbness and tingling and jaw pain.  Cholesterol Currently above goal; *** Continue low cholesterol diet and exercise.  Check lipid panel.   Diabetes with diabetic chronic kidney disease Continue medication: metformin, levemir *** Continue diet and exercise.  Perform daily foot/skin check, notify office of any concerning changes.  Check A1C  CKD stage 2  Increase fluids, avoid NSAIDS, monitor sugars, will monitor BMP/GFR  Obesity with co morbidities Long discussion about weight loss, diet, and exercise Recommended diet heavy in fruits and veggies and low in animal meats, cheeses, and dairy products, appropriate calorie intake Discussed ideal weight for height (below ***) and initial weight goal (***) Patient will work on *** Will follow up in 3 months  Gout Continue allopurinol Diet discussed Check uric acid as needed  Vitamin D Def At goal at last visit; continue supplementation to maintain goal of 70-100 Defer Vit D level  Continue diet and meds as discussed. Further disposition pending results of labs. Discussed med's effects and SE's.   Over 30 minutes of exam, counseling, chart review, and critical decision making was performed.   Future Appointments  Date Time Provider [place]  01/07/2018  8:45 AM Liane Comber, NP [facility] None  04/08/2018  9:30 AM Unk Pinto, MD [facility] None  09/22/2018  3:00 PM Unk Pinto, MD [facility] None    ----------------------------------------------------------------------------------------------------------------------  HPI 52 y.o. male  presents for 3 month follow up on hypertension, cholesterol, diabetes,  gout, weight and vitamin D deficiency.   BMI is There is no height or weight on file to calculate BMI., he {HAS HAS AVW:09811} been working on diet and exercise. Wt Readings from Last 3 Encounters:  09/17/17 257 lb 12.8 oz (116.9 kg)  06/10/17 255 lb (115.7 kg)  04/29/17 257 lb 3.2 oz (116.7 kg)    His blood pressure {HAS HAS NOT:18834} been controlled at home, today their BP is    He {DOES_DOES BJY:78295} workout. He denies chest pain, shortness of breath, dizziness.   He is on cholesterol medication (fenofibrate 134 mg daily *** ) and denies myalgias. His cholesterol {ACTION; IS/IS NOT:21021397} at goal. The cholesterol last visit was:   Lab Results  Component Value Date   CHOL 199 09/17/2017   HDL 31 (L) 09/17/2017   LDLCALC NOT CALC 04/29/2017   TRIG 951 (H) 09/17/2017   CHOLHDL 6.4 (H) 09/17/2017    He {Has/has not:18111} been working on diet and exercise for T2 diabetes treated by metformin and levemir (previously on victoza as well *** ), and denies {Symptoms; diabetes w/o none:19199}. Last A1C in the office was:  Lab Results  Component Value Date   HGBA1C 10.6 (H) 09/17/2017   Patient is on Vitamin D supplement but well below goal of 70 at last check:    Lab Results  Component Value Date   VD25OH 35 09/17/2017     Patient is on allopurinol for gout and does not report a recent flare.  Lab Results  Component Value Date   LABURIC 9.6 (H) 09/17/2017      Current Medications:  Current Outpatient Medications on File Prior to Visit  Medication Sig  . allopurinol (ZYLOPRIM) 300 MG tablet TAKE 1 TABLET BY MOUTH EVERY  DAY  . aspirin 81 MG tablet Take 81 mg by mouth daily.  . benazepril (LOTENSIN) 20 MG tablet TAKE 1 TABLET AT BEDTIME FOR BLOOD PRESSURE AND KIDNEY  . bisoprolol-hydrochlorothiazide (ZIAC) 5-6.25 MG tablet TAKE 1 TABLET BY MOUTH DAILY.  Marland Kitchen Cholecalciferol (VITAMIN D) 2000 units CAPS Take 1 capsule by mouth daily.  . fenofibrate micronized (LOFIBRA) 134 MG  capsule Take 1 tablet daily for blood fats  . insulin detemir (LEVEMIR) 100 UNIT/ML injection Inject 18 Units into the skin 2 (two) times daily.   . Magnesium 500 MG TABS Take by mouth daily.  . metFORMIN (GLUCOPHAGE) 1000 MG tablet Take 1 tablet 2 x/ day for Diabetes  . Multiple Vitamin (MULTIVITAMIN WITH MINERALS) TABS tablet Take 1 tablet by mouth daily.  . vitamin B-12 (CYANOCOBALAMIN) 100 MCG tablet Take 100 mcg by mouth daily.   No current facility-administered medications on file prior to visit.      Allergies:  Allergies  Allergen Reactions  . 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 Welchol [Colesevelam Hcl]     Chest pain     Medical History:  Past Medical History:  Diagnosis Date  . Carpal tunnel syndrome on both sides   . Diabetes mellitus type 2, insulin dependent ([place])   . Fatty liver disease, nonalcoholic   . Gout   . Hyperlipidemia   . Hypertension   . Kidney stone   . Vitamin D deficiency    Family history- Reviewed and unchanged Social history- Reviewed and unchanged   Review of Systems:  Review of Systems  Constitutional: Negative for malaise/fatigue and weight loss.  HENT: Negative for hearing loss and tinnitus.   Eyes: Negative for blurred vision and double vision.  Respiratory: Negative for cough, shortness of breath and wheezing.   Cardiovascular: Negative for chest pain, palpitations, orthopnea, claudication and leg swelling.  Gastrointestinal: Negative for abdominal pain, blood in stool, constipation, diarrhea, heartburn, melena, nausea and vomiting.  Genitourinary: Negative.   Musculoskeletal: Negative for joint pain and myalgias.  Skin: Negative for rash.  Neurological: Negative for dizziness, tingling, sensory change, weakness and headaches.  Endo/Heme/Allergies: Negative for polydipsia.  Psychiatric/Behavioral: Negative.   All other systems reviewed and are negative.   Physical Exam: There were no vitals taken  for this visit. Wt Readings from Last 3 Encounters:  09/17/17 257 lb 12.8 oz (116.9 kg)  06/10/17 255 lb (115.7 kg)  04/29/17 257 lb 3.2 oz (116.7 kg)   General Appearance: Well nourished, in no apparent distress. Eyes: PERRLA, EOMs, conjunctiva no swelling or erythema Sinuses: No Frontal/maxillary tenderness ENT/Mouth: Ext aud canals clear, TMs without erythema, bulging. No erythema, swelling, or exudate on post pharynx.  Tonsils not swollen or erythematous. Hearing normal.  Neck: Supple, thyroid normal.  Respiratory: Respiratory effort normal, BS equal bilaterally without rales, rhonchi, wheezing or stridor.  Cardio: RRR with no MRGs. Brisk peripheral pulses without edema.  Abdomen: Soft, + BS.  Non tender, no guarding, rebound, hernias, masses. Lymphatics: Non tender without lymphadenopathy.  Musculoskeletal: Full ROM, 5/5 strength, {PSY - GAIT AND STATION:22860} gait Skin: Warm, dry without rashes, lesions, ecchymosis.  Neuro: Cranial nerves intact. No cerebellar symptoms.  Psych: Awake and oriented X 3, normal affect, Insight and Judgment appropriate.    Izora Ribas, NP 8:21 AM [facility]

## 2018-01-07 ENCOUNTER — Ambulatory Visit: Payer: Self-pay | Admitting: Adult Health

## 2018-01-27 NOTE — Progress Notes (Signed)
FOLLOW UP  Assessment and Plan:   Hypertension Well controlled with current medications Monitor blood pressure at home; patient to call if consistently greater than 130/80 Continue DASH diet.   Reminder to go to the ER if any CP, SOB, nausea, dizziness, severe HA, changes vision/speech, left arm numbness and tingling and jaw pain.  Cholesterol Currently above goal; fenofibrate was started at last visit; discussed due to diabetes will also initiate statin medication for LDL control and risk reduction Continue low cholesterol diet and exercise.  Check lipid panel.   Diabetes with diabetic chronic kidney disease Continue medication: metformin, levemir 10 units daily - discussed switching to novolin 70/30 pending A1C results for better control. Technique demonstrated, risks and hypoglycemia symptoms and management discussed, information provided on AVS Reminded he needs to get annual diabetic eye exam Encouraged to check glucose twice daily; fasting and rotating post prandial - glucose log given Continue diet and exercise.  Perform daily foot/skin check, notify office of any concerning changes.  Check A1C, if starting novolin will do close follow up in 2-4 weeks.   CKD stage 2  Increase fluids, avoid NSAIDS, monitor sugars, will monitor BMP/GFR  Obesity with co morbidities Long discussion about weight loss, diet, and exercise Recommended diet heavy in fruits and veggies and low in animal meats, cheeses, and dairy products, appropriate calorie intake Discussed ideal weight for height and initial weight goal (225 lb) Patient will work on continue to cut out pasta, soda, sweet tea -  Will follow up in 3 months  Gout Continue allopurinol Diet discussed Check uric acid as needed  Vitamin D Def Below goal at last visit; increase supplementation for goal of 70-100 Check Vit D level  Continue diet and meds as discussed. Further disposition pending results of labs. Discussed med's  effects and SE's.   Over 30 minutes of exam, counseling, chart review, and critical decision making was performed.   Future Appointments  Date Time Provider [place]  04/08/2018  9:30 AM Unk Pinto, MD [facility] None  09/22/2018  3:00 PM Unk Pinto, MD [facility] None    ----------------------------------------------------------------------------------------------------------------------  HPI 52 y.o. male  presents for 3 month follow up on hypertension, cholesterol, diabetes, gout, weight and vitamin D deficiency. He presents with very notable painless lump in his neck; he reports his daughter noticed this and pointed out at the dinner table last week. He denies recent URI, tooth infection, ulcers in mouth, fever/chills. He is a active cigar smoker.   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  Wt Readings from Last 3 Encounters:  01/28/18 256 lb (116.1 kg)  09/17/17 257 lb 12.8 oz (116.9 kg)  06/10/17 255 lb (115.7 kg)   He does not currently check BPs at home, today their BP is BP: 138/88  He does not workout. He denies chest pain, shortness of breath, dizziness.   He is on cholesterol medication (fenofibrate 134 mg daily - new since last visit ) and denies myalgias. His cholesterol is not at goal. The cholesterol last visit was:   Lab Results  Component Value Date   CHOL 199 09/17/2017   HDL 31 (L) 09/17/2017   [place]  09/17/2017     Comment:     . LDL cholesterol not calculated. Triglyceride levels greater than 400 mg/dL invalidate calculated  LDL results. . Reference range: <100 . Desirable range <100 mg/dL for primary prevention;   <70 mg/dL for patients with CHD or diabetic patients  with > or = 2 CHD risk factors. Marland Kitchen LDL-C is now calculated using the Martin-Hopkins  calculation,  which is a validated novel method providing  better accuracy than the Friedewald equation in the  estimation of LDL-C.  [name] et al. Annamaria Helling. 9622;297(98): 2061-2068  (http://education.[employer].com/faq/FAQ164)    TRIG 951 (H) 09/17/2017   CHOLHDL 6.4 (H) 09/17/2017    He has been working on diet and exercise for T2 diabetes treated by metformin and levemir (previously on victoza as well, d/c'd due to cost), and denies foot ulcerations, hypoglycemia , increased appetite, nausea, paresthesia of the feet, polydipsia, polyuria, visual disturbances, vomiting and weight loss. He does check occasional fasting glucose (range 169-224). Last A1C in the office was:  Lab Results  Component Value Date   HGBA1C 10.6 (H) 09/17/2017   Patient is on Vitamin D supplement but well below goal of 70 at last check:    Lab Results  Component Value Date   VD25OH 35 09/17/2017     Patient is on allopurinol for gout and does not report a recent flare.  Lab Results  Component Value Date   LABURIC 9.6 (H) 09/17/2017      Current Medications:  Current Outpatient Medications on File Prior to Visit  Medication Sig  . allopurinol (ZYLOPRIM) 300 MG tablet TAKE 1 TABLET BY MOUTH EVERY DAY  . aspirin 81 MG tablet Take 81 mg by mouth daily.  . benazepril (LOTENSIN) 20 MG tablet TAKE 1 TABLET AT BEDTIME FOR BLOOD PRESSURE AND KIDNEY  . bisoprolol-hydrochlorothiazide (ZIAC) 5-6.25 MG tablet TAKE 1 TABLET BY MOUTH DAILY.  Marland Kitchen Cholecalciferol (VITAMIN D) 2000 units CAPS Take 1 capsule by mouth daily.  . fenofibrate micronized (LOFIBRA) 134 MG capsule Take 1 tablet daily for blood fats  . insulin detemir (LEVEMIR) 100 UNIT/ML injection Inject 18 Units into the skin 2 (two) times daily.   . Magnesium 500 MG TABS Take by mouth daily.  . metFORMIN (GLUCOPHAGE) 1000 MG tablet Take 1 tablet 2 x/ day for Diabetes  . Multiple Vitamin (MULTIVITAMIN WITH MINERALS) TABS tablet Take 1 tablet by mouth daily.  . vitamin  B-12 (CYANOCOBALAMIN) 100 MCG tablet Take 100 mcg by mouth daily.   No current facility-administered medications on file prior to visit.      Allergies:  Allergies  Allergen Reactions  . 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 Welchol [Colesevelam Hcl]     Chest pain     Medical History:  Past Medical History:  Diagnosis Date  . Carpal tunnel syndrome on both sides   . Diabetes mellitus type 2, insulin dependent ([place])   . Fatty liver disease, nonalcoholic   . Gout   . Hyperlipidemia   . Hypertension   . Kidney stone   . Vitamin D deficiency    Family history- Reviewed and unchanged Social history- Reviewed and unchanged   Review of Systems:  Review of Systems  Constitutional: Negative for malaise/fatigue and weight loss.  HENT: Negative for hearing loss and tinnitus.   Eyes: Negative for blurred vision and double vision.  Respiratory: Negative for cough, shortness of breath and wheezing.   Cardiovascular: Negative for chest pain, palpitations, orthopnea, claudication and leg swelling.  Gastrointestinal: Negative for abdominal pain, blood in stool, constipation, diarrhea, heartburn, melena, nausea and vomiting.  Genitourinary:  Negative.   Musculoskeletal: Negative for joint pain and myalgias.  Skin: Negative for rash.  Neurological: Negative for dizziness, tingling, sensory change, weakness and headaches.  Endo/Heme/Allergies: Negative for polydipsia.  Psychiatric/Behavioral: Negative.   All other systems reviewed and are negative.   Physical Exam: BP 138/88   Pulse 84   Temp 97.9 F (36.6 C)   Ht 6' 1.5" (1.867 m)   Wt 256 lb (116.1 kg)   SpO2 99%   BMI 33.32 kg/m  Wt Readings from Last 3 Encounters:  01/28/18 256 lb (116.1 kg)  09/17/17 257 lb 12.8 oz (116.9 kg)  06/10/17 255 lb (115.7 kg)   General Appearance: Well nourished, in no apparent distress. Eyes: PERRLA, EOMs, conjunctiva no swelling or erythema Sinuses: No  Frontal/maxillary tenderness ENT/Mouth: Ext aud canals clear, TMs without erythema, bulging. No erythema, swelling, or exudate on post pharynx.  Tonsils not swollen or erythematous. Hearing normal.  Neck: Supple, thyroid nonpalpable. Left anterior lump, approx 2-3 cm, smooth, non-tender.  Respiratory: Respiratory effort normal, BS equal bilaterally without rales, rhonchi, wheezing or stridor.  Cardio: RRR with no MRGs. Brisk peripheral pulses without edema.  Abdomen: Soft, + BS.  Non tender, no guarding, rebound, hernias, masses. Lymphatics: Non tender without lymphadenopathy.  Musculoskeletal: Full ROM, 5/5 strength, Normal gait Skin: Warm, dry without rashes, lesions, ecchymosis.  Neuro: Cranial nerves intact. No cerebellar symptoms.  Psych: Awake and oriented X 3, normal affect, Insight and Judgment appropriate.    Izora Ribas, NP 9:10 AM [facility]

## 2018-01-28 ENCOUNTER — Ambulatory Visit (INDEPENDENT_AMBULATORY_CARE_PROVIDER_SITE_OTHER): Payer: BLUE CROSS/BLUE SHIELD | Admitting: Adult Health

## 2018-01-28 ENCOUNTER — Encounter: Payer: Self-pay | Admitting: Adult Health

## 2018-01-28 VITALS — BP 138/88 | HR 84 | Temp 97.9°F | Ht 73.5 in | Wt 256.0 lb

## 2018-01-28 DIAGNOSIS — E1122 Type 2 diabetes mellitus with diabetic chronic kidney disease: Secondary | ICD-10-CM

## 2018-01-28 DIAGNOSIS — M1000 Idiopathic gout, unspecified site: Secondary | ICD-10-CM

## 2018-01-28 DIAGNOSIS — R221 Localized swelling, mass and lump, neck: Secondary | ICD-10-CM

## 2018-01-28 DIAGNOSIS — E559 Vitamin D deficiency, unspecified: Secondary | ICD-10-CM | POA: Diagnosis not present

## 2018-01-28 DIAGNOSIS — Z87891 Personal history of nicotine dependence: Secondary | ICD-10-CM | POA: Insufficient documentation

## 2018-01-28 DIAGNOSIS — E669 Obesity, unspecified: Secondary | ICD-10-CM

## 2018-01-28 DIAGNOSIS — N182 Chronic kidney disease, stage 2 (mild): Secondary | ICD-10-CM

## 2018-01-28 DIAGNOSIS — Z79899 Other long term (current) drug therapy: Secondary | ICD-10-CM | POA: Diagnosis not present

## 2018-01-28 DIAGNOSIS — F17200 Nicotine dependence, unspecified, uncomplicated: Secondary | ICD-10-CM

## 2018-01-28 DIAGNOSIS — E66811 Obesity, class 1: Secondary | ICD-10-CM

## 2018-01-28 DIAGNOSIS — E782 Mixed hyperlipidemia: Secondary | ICD-10-CM | POA: Diagnosis not present

## 2018-01-28 DIAGNOSIS — I129 Hypertensive chronic kidney disease with stage 1 through stage 4 chronic kidney disease, or unspecified chronic kidney disease: Secondary | ICD-10-CM

## 2018-01-28 DIAGNOSIS — I10 Essential (primary) hypertension: Secondary | ICD-10-CM | POA: Diagnosis not present

## 2018-01-28 NOTE — Patient Instructions (Addendum)
We will probably start you on a different diabetes medication - Novolin 70/30 -      Targets for Glucose Readings: Time of Check Usual Target for Most People  Before Meals  70-130  Two hours after meals  Less than 180  Bedtime  90-150   Why Should You Check Your Blood Glucose? -The A1C tells you how your diabetes is doing over a 3 month period.  Home blood glucose monitoring (or checking) gives you information about your diabetes on a daily basis.  You will learn how well your diabetes care plan is working and whether your blood glucose is in your target range throughout the day.   -Reviewing daily blood glucose levels will help you and your healthcare team make any needed changes to you meal plan, physical activity and medications.  Meter Supplies: - A glucose meter was provided at your visit today along with strips and lancets. The blood glucose meter strips and lancets are usually covered by health insurance. Please let us know if they are not and contact your insurance to see which meter they prefer.  How to get a good blood sample: -Wash your hands in warm water.  You do not need to use alcohol wipes. -Massage your hands. -Choose which finger you will use.  It helps to use a different finger each time to avoid soreness. -Keep you hand below your wrist when using you lancet to "prick" your finger. -Apply gentle pressure, but do NOT squeeze your finger.  Checking your blood glucose Important Reminders: -Make sure your strips are not expired.  Check the date on the bottle. -Make sure the code on bottle matches the code on your machine. -Make sure your hands are clean and dry. -Do not use the center of your finger, it is the most sensitive area.  Use a spot to the side of the center of your fingertip. -Completely fill the strip target area with blood (until it beeps) to make sure the results are accurate. -You will need a prescription to have your glucose strips and lancets covered  by insurance. -There is usually an 800 number on the back of your meter for help with meter issues.  How often to check: -If you take diabetes pills or take one injection of insulin each day, you will usually be asked to check twice a day, before breakfast and 2 hours after one meal, or as directed. -If you take several insulin injections each day, you will usually be asked to check four times a day before meals and at bedtime every day.    Hypoglycemia Hypoglycemia occurs when the level of sugar (glucose) in the blood is too low. Glucose is a type of sugar that provides the body's main source of energy. Certain hormones (insulin and glucagon) control the level of glucose in the blood. Insulin lowers blood glucose, and glucagon increases blood glucose. Hypoglycemia can result from having too much insulin in the bloodstream, or from not eating enough food that contains glucose. Hypoglycemia can happen in people who do or do not have diabetes. It can develop quickly, and it can be a medical emergency. What are the causes? Hypoglycemia occurs most often in people who have diabetes. If you have diabetes, hypoglycemia may be caused by:  Diabetes medicine.  Not eating enough, or not eating often enough.  Increased physical activity.  Drinking alcohol, especially when you have not eaten recently.  If you do not have diabetes, hypoglycemia may be caused by:  A  tumor in the pancreas. The pancreas is the organ that makes insulin.  Not eating enough, or not eating for long periods at a time (fasting).  Severe infection or illness that affects the liver, heart, or kidneys.  Certain medicines.  You may also have reactive hypoglycemia. This condition causes hypoglycemia within 4 hours of eating a meal. This may occur after having stomach surgery. Sometimes, the cause of reactive hypoglycemia is not known. What increases the risk? Hypoglycemia is more likely to develop in:  People who have  diabetes and take medicines to lower blood glucose.  People who abuse alcohol.  People who have a severe illness.  What are the signs or symptoms? Hypoglycemia may not cause any symptoms. If you have symptoms, they may include:  Hunger.  Anxiety.  Sweating and feeling clammy.  Confusion.  Dizziness or feeling light-headed.  Sleepiness.  Nausea.  Increased heart rate.  Headache.  Blurry vision.  Seizure.  Nightmares.  Tingling or numbness around the mouth, lips, or tongue.  A change in speech.  Decreased ability to concentrate.  A change in coordination.  Restless sleep.  Tremors or shakes.  Fainting.  Irritability.  How is this diagnosed? Hypoglycemia is diagnosed with a blood test to measure your blood glucose level. This blood test is done while you are having symptoms. Your health care provider may also do a physical exam and review your medical history. If you do not have diabetes, other tests may be done to find the cause of your hypoglycemia. How is this treated? This condition can often be treated by immediately eating or drinking something that contains glucose, such as:  3-4 sugar tablets (glucose pills).  Glucose gel, 15-gram tube.  Fruit juice, 4 oz (120 mL).  Regular soda (not diet soda), 4 oz (120 mL).  Low-fat milk, 4 oz (120 mL).  Several pieces of hard candy.  Sugar or honey, 1 Tbsp.  Treating Hypoglycemia If You Have Diabetes  If you are alert and able to swallow safely, follow the 15:15 rule:  Take 15 grams of a rapid-acting carbohydrate. Rapid-acting options include: ? 1 tube of glucose gel. ? 3 glucose pills. ? 6-8 pieces of hard candy. ? 4 oz (120 mL) of fruit juice. ? 4 oz (120 ml) of regular (not diet) soda.  Check your blood glucose 15 minutes after you take the carbohydrate.  If the repeat blood glucose level is still at or below 70 mg/dL (3.9 mmol/L), take 15 grams of a carbohydrate again.  If your blood  glucose level does not increase above 70 mg/dL (3.9 mmol/L) after 3 tries, seek emergency medical care.  After your blood glucose level returns to normal, eat a meal or a snack within 1 hour.  Treating Severe Hypoglycemia Severe hypoglycemia is when your blood glucose level is at or below 54 mg/dL (3 mmol/L). Severe hypoglycemia is an emergency. Do not wait to see if the symptoms will go away. Get medical help right away. Call your local emergency services (911 in the [place] Do not drive yourself to the hospital. If you have severe hypoglycemia and you cannot eat or drink, you may need an injection of glucagon. A family member or close friend should learn how to check your blood glucose and how to give you a glucagon injection. Ask your health care provider if you need to have an emergency glucagon injection kit available. Severe hypoglycemia may need to be treated in a hospital. The treatment may include getting glucose through  an IV tube. You may also need treatment for the cause of your hypoglycemia. Follow these instructions at home: General instructions  Avoid any diets that cause you to not eat enough food. Talk with your health care provider before you start any new diet.  Take over-the-counter and prescription medicines only as told by your health care provider.  Limit alcohol intake to no more than 1 drink per day for nonpregnant women and 2 drinks per day for men. One drink equals 12 oz of beer, 5 oz of wine, or 1 oz of hard liquor.  Keep all follow-up visits as told by your health care provider. This is important. If You Have Diabetes:   Make sure you know the symptoms of hypoglycemia.  Always have a rapid-acting carbohydrate snack with you to treat low blood sugar.  Follow your diabetes management plan, as told by your health care provider. Make sure you: ? Take your medicines as directed. ? Follow your exercise plan. ? Follow your meal plan. Eat on time, and do not skip  meals. ? Check your blood glucose as often as directed. Make sure to check your blood glucose before and after exercise. If you exercise longer or in a different way than usual, check your blood glucose more often. ? Follow your sick day plan whenever you cannot eat or drink normally. Make this plan in advance with your health care provider.  Share your diabetes management plan with people in your workplace, school, and household.  Check your urine for ketones when you are ill and as told by your health care provider.  Carry a medical alert card or wear medical alert jewelry. If You Have Reactive Hypoglycemia or Low Blood Sugar From Other Causes:  Monitor your blood glucose as told by your health care provider.  Follow instructions from your health care provider about eating or drinking restrictions. Contact a health care provider if:  You have problems keeping your blood glucose in your target range.  You have frequent episodes of hypoglycemia. Get help right away if:  You continue to have hypoglycemia symptoms after eating or drinking something containing glucose.  Your blood glucose is at or below 54 mg/dL (3 mmol/L).  You have a seizure.  You faint. These symptoms may represent a serious problem that is an emergency. Do not wait to see if the symptoms will go away. Get medical help right away. Call your local emergency services (911 in the [place] Do not drive yourself to the hospital. This information is not intended to replace advice given to you by your health care provider. Make sure you discuss any questions you have with your health care provider. Document Released: 10/29/2005 Document Revised: 04/11/2016 Document Reviewed: 12/02/2015 Elsevier Interactive Patient Education  2018 [employer].  Insulin Injection Instructions, Single Insulin Dose, Adult A subcutaneous injection is a shot of medicine that is injected into the layer of fat between skin and muscle. People with  type 1 diabetes must take insulin because their bodies do not make it. People with type 2 diabetes may need to take insulin. There are many different types of insulin. The type of insulin that you take may determine how many injections you give yourself and when you need to take the injections. Choosing a site for injection Insulin absorption varies from site to site. It is best to inject insulin within the same body area, using a different spot in that area for each injection. Do not inject the insulin in the same  spot for each injection. There are five main areas that can be used for injecting. These areas include:  Abdomen. This is the preferred area.  Front of thigh.  Upper, outer side of thigh.  Back of upper arm.  Buttocks.  Using a syringe and vial: single insulin dose First, follow the steps for Getting Ready, then continue with the steps for Pushing Air Into the [place], then follow the steps for Filling the Syringe, and finish with the steps for Injecting the Insulin. Getting Ready  1. Wash your hands with soap and water. If soap and water are not available, use hand sanitizer. 2. Check the expiration date and type of insulin. 3. If you are using CLEAR insulin, check to see that it is clear and free of clumps. 4. Gently roll the medicine bottle (vial) between your palms to warm it. Do not shake the vial. 5. Remove the plastic pop-top covering from the vial, if one is present. 6. Use an alcohol wipe to clean the rubber top of the vial. 7. Remove the plastic cover from the needle on the syringe. Do not let the needle touch anything. Pushing Air Into the [place]  1. Pull the plunger back to bring (draw up) air into the syringe. The amount of air should be the same as the number of units in a dose of single insulin. 2. While you keep the vial right-side up, poke the needle through the rubber top of the vial. Do not turn the vial upside down to do this. 3. Push the plunger in all the way.  This pushes air into the vial. 4. Do not take the needle out of the vial yet. Filling the Syringe 1. With the needle still inserted in the vial, turn the vial upside down and hold it at eye level. 2. Slowly pull back on the plunger to draw up the desired number of insulin units into the syringe. 3. If you see air bubbles in the syringe, slowly move the plunger up and down 2 or 3 times to get rid of them. 4. Pull back the plunger until the syringe is filled to the correct dose. 5. Remove the needle from the vial. Do not let the needle touch anything. Injecting the Insulin  1. Use an alcohol wipe to clean the site where you will be injecting the needle. Let the site air-dry. 2. Hold the syringe in your writing hand like a pencil. 3. Use your other hand to pinch and hold about an inch of skin. Do not directly touch the cleaned part of the skin. 4. Gently but quickly, put the needle straight into the skin. The needle should be at a 90-degree angle (perpendicular) to the skin, as if to form the letter "L." ? For example, if you are giving an injection in the abdomen, the abdomen forms one "leg" of the "L" and the needle forms the other "leg" of the "L." 5. For adults who have a small amount of body fat, the needle may need to be injected at a 45-degree angle instead. Your health care provider will tell you if this is necessary. ? A 45-degree angle looks like the letter "V." 6. Push the needle in as far as it will go (to the hub). 7. When the needle is completely inserted into the skin, use the thumb of your writing hand to push the plunger down all the way to inject the insulin. 8. Let go of the skin that you are pinching. Continue to  hold the syringe in place with your writing hand. 9. Wait 5 seconds, then pull the needle straight out of the skin. 10. Press and hold the alcohol wipe over the injection site until any bleeding stops. Do not rub the area. 11. Do not put the plastic cover back on the  needle. 12. Discard the syringe and needle directly into a sharps container, such as an empty plastic bottle with a cover. Throwing away supplies   Discard all used needles in a puncture-proof sharps disposal container. You can ask your local pharmacy about where you can get this kind of disposal container, or you can use an empty liquid laundry detergent bottle that has a cover.  Follow the disposal regulations for the area where you live. Do not use any syringe or needle more than one time.  Throw away empty vials in the regular trash. What questions should I ask my health care provider?  How often should I be taking insulin?  How often should I check my blood glucose?  What amount of insulin should I be taking at each time?  What are the side effects?  What should I do if my blood glucose is too high?  What should I do if my blood glucose is too low?  What should I do if I forget to take my insulin?  What number should I call if I have questions? Where can I get more information?  American Diabetes Association (ADA): www.diabetes.org  American Association of Diabetes Educators (AADE) Patient Resources: https://www.diabeteseducator.org/patient-resources This information is not intended to replace advice given to you by your health care provider. Make sure you discuss any questions you have with your health care provider. Document Released: 12/02/2015 Document Revised: 04/05/2016 Document Reviewed: 12/02/2015 Elsevier Interactive Patient Education  [employer].

## 2018-01-29 ENCOUNTER — Other Ambulatory Visit: Payer: Self-pay | Admitting: Adult Health

## 2018-01-29 DIAGNOSIS — E782 Mixed hyperlipidemia: Secondary | ICD-10-CM

## 2018-01-29 LAB — CBC WITH DIFFERENTIAL/PLATELET
Basophils Absolute: 42 cells/uL (ref 0–200)
Basophils Relative: 0.8 %
EOS ABS: 403 {cells}/uL (ref 15–500)
Eosinophils Relative: 7.6 %
HCT: 43.7 % (ref 38.5–50.0)
Hemoglobin: 15 g/dL (ref 13.2–17.1)
LYMPHS ABS: 1415 {cells}/uL (ref 850–3900)
MCH: 30.7 pg (ref 27.0–33.0)
MCHC: 34.3 g/dL (ref 32.0–36.0)
MCV: 89.4 fL (ref 80.0–100.0)
MPV: 10.6 fL (ref 7.5–12.5)
Monocytes Relative: 8.9 %
NEUTROS PCT: 56 %
Neutro Abs: 2968 cells/uL (ref 1500–7800)
Platelets: 251 10*3/uL (ref 140–400)
RBC: 4.89 10*6/uL (ref 4.20–5.80)
RDW: 12.3 % (ref 11.0–15.0)
TOTAL LYMPHOCYTE: 26.7 %
WBC: 5.3 10*3/uL (ref 3.8–10.8)
WBCMIX: 472 {cells}/uL (ref 200–950)

## 2018-01-29 LAB — HEMOGLOBIN A1C
Hgb A1c MFr Bld: 9.1 % of total Hgb — ABNORMAL HIGH (ref <5.7)
Mean Plasma Glucose: 214 (calc)
eAG (mmol/L): 11.9 (calc)

## 2018-01-29 LAB — BASIC METABOLIC PANEL WITH GFR
BUN: 11 mg/dL (ref 7–25)
CALCIUM: 10.2 mg/dL (ref 8.6–10.3)
CO2: 27 mmol/L (ref 20–32)
Chloride: 100 mmol/L (ref 98–110)
Creat: 0.87 mg/dL (ref 0.70–1.33)
GFR, EST AFRICAN AMERICAN: 115 mL/min/{1.73_m2} (ref >=60)
GFR, Est Non African American: 99 mL/min/{1.73_m2} (ref >=60)
Glucose, Bld: 166 mg/dL — ABNORMAL HIGH (ref 65–99)
POTASSIUM: 4.8 mmol/L (ref 3.5–5.3)
Sodium: 136 mmol/L (ref 135–146)

## 2018-01-29 LAB — LIPID PANEL
CHOL/HDL RATIO: 4.3 (calc) (ref <5.0)
Cholesterol: 168 mg/dL (ref <200)
HDL: 39 mg/dL — ABNORMAL LOW (ref >40)
LDL CHOLESTEROL (CALC): 104 mg/dL — AB
NON-HDL CHOLESTEROL (CALC): 129 mg/dL (ref <130)
Triglycerides: 152 mg/dL — ABNORMAL HIGH (ref <150)

## 2018-01-29 LAB — HEPATIC FUNCTION PANEL
AG Ratio: 1.3 (calc) (ref 1.0–2.5)
ALBUMIN MSPROF: 4.5 g/dL (ref 3.6–5.1)
ALT: 26 U/L (ref 9–46)
AST: 21 U/L (ref 10–35)
Alkaline phosphatase (APISO): 79 U/L (ref 40–115)
BILIRUBIN DIRECT: 0.1 mg/dL (ref 0.0–0.2)
Globulin: 3.6 g/dL (calc) (ref 1.9–3.7)
Indirect Bilirubin: 0.4 mg/dL (calc) (ref 0.2–1.2)
Total Bilirubin: 0.5 mg/dL (ref 0.2–1.2)
Total Protein: 8.1 g/dL (ref 6.1–8.1)

## 2018-01-29 LAB — TSH: TSH: 2.68 mIU/L (ref 0.40–4.50)

## 2018-01-29 LAB — VITAMIN D 25 HYDROXY (VIT D DEFICIENCY, FRACTURES): Vit D, 25-Hydroxy: 43 ng/mL (ref 30–100)

## 2018-01-29 MED ORDER — ATORVASTATIN CALCIUM 10 MG PO TABS: 10.0000 mg | ORAL_TABLET | Freq: Every day | ORAL | 1 refills | Status: DC

## 2018-02-04 ENCOUNTER — Ambulatory Visit
Admission: RE | Admit: 2018-02-04 | Discharge: 2018-02-04 | Disposition: A | Discharge status: Home / Self Care | Payer: Self-pay | Source: Ambulatory Visit | Attending: Adult Health | Admitting: Adult Health

## 2018-02-04 DIAGNOSIS — R221 Localized swelling, mass and lump, neck: Secondary | ICD-10-CM

## 2018-02-09 ENCOUNTER — Other Ambulatory Visit: Payer: Self-pay | Admitting: Physician Assistant

## 2018-03-24 ENCOUNTER — Other Ambulatory Visit: Payer: Self-pay | Admitting: Internal Medicine

## 2018-03-31 ENCOUNTER — Other Ambulatory Visit: Payer: Self-pay | Admitting: Internal Medicine

## 2018-03-31 MED ORDER — PHENTERMINE HCL 37.5 MG PO CAPS: 37.5000 mg | ORAL_CAPSULE | ORAL | 2 refills | Status: DC

## 2018-03-31 NOTE — Addendum Note (Signed)
Addended by: Chancy Hurter on: 03/31/2018 03:34 PM   Modules accepted: Orders

## 2018-03-31 NOTE — Telephone Encounter (Signed)
phentremine renewal request. [place]

## 2018-04-08 ENCOUNTER — Ambulatory Visit: Payer: Self-pay | Admitting: Internal Medicine

## 2018-05-20 ENCOUNTER — Ambulatory Visit: Payer: BLUE CROSS/BLUE SHIELD | Admitting: Internal Medicine

## 2018-05-20 ENCOUNTER — Encounter: Payer: Self-pay | Admitting: Internal Medicine

## 2018-05-20 VITALS — BP 102/74 | HR 68 | Temp 97.2°F | Resp 18 | Ht 73.5 in | Wt 253.8 lb

## 2018-05-20 DIAGNOSIS — E782 Mixed hyperlipidemia: Secondary | ICD-10-CM | POA: Diagnosis not present

## 2018-05-20 DIAGNOSIS — I10 Essential (primary) hypertension: Secondary | ICD-10-CM | POA: Diagnosis not present

## 2018-05-20 DIAGNOSIS — M1000 Idiopathic gout, unspecified site: Secondary | ICD-10-CM | POA: Diagnosis not present

## 2018-05-20 DIAGNOSIS — Z794 Long term (current) use of insulin: Secondary | ICD-10-CM | POA: Diagnosis not present

## 2018-05-20 DIAGNOSIS — E1122 Type 2 diabetes mellitus with diabetic chronic kidney disease: Secondary | ICD-10-CM | POA: Diagnosis not present

## 2018-05-20 DIAGNOSIS — Z79899 Other long term (current) drug therapy: Secondary | ICD-10-CM | POA: Diagnosis not present

## 2018-05-20 DIAGNOSIS — E559 Vitamin D deficiency, unspecified: Secondary | ICD-10-CM | POA: Diagnosis not present

## 2018-05-20 DIAGNOSIS — N182 Chronic kidney disease, stage 2 (mild): Secondary | ICD-10-CM | POA: Diagnosis not present

## 2018-05-20 DIAGNOSIS — E119 Type 2 diabetes mellitus without complications: Secondary | ICD-10-CM | POA: Diagnosis not present

## 2018-05-20 NOTE — Patient Instructions (Signed)

## 2018-05-20 NOTE — Progress Notes (Signed)
This very nice 52 y.o. DBM presents for 6 month follow up with HTN, HLD,  IDDM and Vitamin D Deficiency. Patient has Gout controlled on Allopurinol.      Patient is treated for HTN (2007) & BP has been controlled at home. Today's BP is at goal - 102/74. Patient has had no complaints of any cardiac type chest pain, palpitations, dyspnea / orthopnea / PND, dizziness, claudication, or dependent edema.     Hyperlipidemia is controlled with diet & meds. Patient denies myalgias or other med SE's. Last Lipids were at goal albeit elevated Trig's: Lab Results  Component Value Date   CHOL 126 05/20/2018   HDL 31 (L) 05/20/2018   LDLCALC 62 05/20/2018   TRIG 280 (H) 05/20/2018   CHOLHDL 4.1 05/20/2018      Also, the patient has history of Insulin Requiring T2_NIDDM (2007)  and has had no symptoms of reactive hypoglycemia, diabetic polys, paresthesias or visual blurring.  Patent's dietary compliance historically has been poor and last A1c was not at goal: Lab Results  Component Value Date   HGBA1C 9.1 (H) 01/28/2018      Further, the patient also has history of Vitamin D Deficiency ("23" / 2008)  and supplements vitamin D without any suspected side-effects. Last vitamin D was still low (goal 70-100):  Lab Results  Component Value Date   VD25OH 43 01/28/2018   Current Outpatient Medications on File Prior to Visit  Medication Sig  . allopurinol (ZYLOPRIM) 300 MG tablet TAKE 1 TABLET BY MOUTH EVERY DAY  . aspirin 81 MG tablet Take 81 mg by mouth daily.  Marland Kitchen atorvastatin (LIPITOR) 10 MG tablet Take 1 tablet (10 mg total) by mouth daily.  . benazepril (LOTENSIN) 20 MG tablet TAKE 1 TABLET AT BEDTIME FOR BLOOD PRESSURE AND KIDNEY  . bisoprolol-hydrochlorothiazide (ZIAC) 5-6.25 MG tablet TAKE 1 TABLET BY MOUTH DAILY.  Marland Kitchen Cholecalciferol (VITAMIN D) 2000 units CAPS Take 1 capsule by mouth daily.  . insulin detemir (LEVEMIR) 100 UNIT/ML injection Inject 18 Units into the skin 2 (two) times daily.   .  Magnesium 500 MG TABS Take by mouth daily.  . metFORMIN (GLUCOPHAGE) 1000 MG tablet Take 1 tablet 2 x/ day for Diabetes  . Multiple Vitamin (MULTIVITAMIN WITH MINERALS) TABS tablet Take 1 tablet by mouth daily.  . phentermine 37.5 MG capsule Take 1 capsule (37.5 mg total) by mouth every morning.  . vitamin B-12 (CYANOCOBALAMIN) 100 MCG tablet Take 100 mcg by mouth daily.  . fenofibrate micronized (LOFIBRA) 134 MG capsule Take 1 tablet daily for blood fats   No current facility-administered medications on file prior to visit.    Allergies  Allergen Reactions  . 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 Welchol [Colesevelam Hcl]     Chest pain   PMHx:   Past Medical History:  Diagnosis Date  . Carpal tunnel syndrome on both sides   . Diabetes mellitus type 2, insulin dependent ([place])   . Fatty liver disease, nonalcoholic   . Gout   . Hyperlipidemia   . Hypertension   . Kidney stone   . Vitamin D deficiency    Immunization History  Administered Date(s) Administered  . Influenza Inj Mdck Quad With Preservative 09/17/2017  . Influenza Split 09/05/2015  . Influenza,inj,quad, With Preservative 10/22/2016  . Pneumococcal Polysaccharide-23 09/05/2015  . Pneumococcal-Unspecified 11/22/2009  . Td 09/17/2017  . Tdap 11/22/2006   Past Surgical History:  Procedure  Laterality Date  . CYSTOSCOPY WITH RETROGRADE PYELOGRAM, URETEROSCOPY AND STENT PLACEMENT Right 02/01/2015   Procedure: CYSTOSCOPY WITH RETROGRADE PYELOGRAM, URETEROSCOPY, DIGITAL URETEROSCOPY with [place];  Surgeon: Alexis Frock, MD;  Location: [facility];  Service: Urology;  Laterality: Right;  . HOLMIUM LASER APPLICATION Right 05/11/1600   Procedure: HOLMIUM LASER APPLICATION;  Surgeon: Alexis Frock, MD;  Location[facility];  Service: Urology;  Laterality: Right;   FHx:    Reviewed / unchanged  SHx:    Reviewed / unchanged   Systems  Review:  Constitutional: Denies fever, chills, wt changes, headaches, insomnia, fatigue, night sweats, change in appetite. Eyes: Denies redness, blurred vision, diplopia, discharge, itchy, watery eyes.  ENT: Denies discharge, congestion, post nasal drip, epistaxis, sore throat, earache, hearing loss, dental pain, tinnitus, vertigo, sinus pain, snoring.  CV: Denies chest pain, palpitations, irregular heartbeat, syncope, dyspnea, diaphoresis, orthopnea, PND, claudication or edema. Respiratory: denies cough, dyspnea, DOE, pleurisy, hoarseness, laryngitis, wheezing.  Gastrointestinal: Denies dysphagia, odynophagia, heartburn, reflux, water brash, abdominal pain or cramps, nausea, vomiting, bloating, diarrhea, constipation, hematemesis, melena, hematochezia  or hemorrhoids. Genitourinary: Denies dysuria, frequency, urgency, nocturia, hesitancy, discharge, hematuria or flank pain. Musculoskeletal: Denies arthralgias, myalgias, stiffness, jt. swelling, pain, limping or strain/sprain.  Skin: Denies pruritus, rash, hives, warts, acne, eczema or change in skin lesion(s). Neuro: No weakness, tremor, incoordination, spasms, paresthesia or pain. Psychiatric: Denies confusion, memory loss or sensory loss. Endo: Denies change in weight, skin or hair change.  Heme/Lymph: No excessive bleeding, bruising or enlarged lymph nodes.  Physical Exam  BP 102/74   Pulse 68   Temp (!) 97.2 F (36.2 C)   Resp 18   Ht 6' 1.5" (1.867 m)   Wt 253 lb 12.8 oz (115.1 kg)   BMI 33.03 kg/m   Appears  well nourished, well groomed  and in no distress.  Eyes: PERRLA, EOMs, conjunctiva no swelling or erythema. Sinuses: No frontal/maxillary tenderness ENT/Mouth: EAC's clear, TM's nl w/o erythema, bulging. Nares clear w/o erythema, swelling, exudates. Oropharynx clear without erythema or exudates. Oral hygiene is good. Tongue normal, non obstructing. Hearing intact.  Neck: Supple. Thyroid not palpable. Car 2+/2+ without  bruits, nodes or JVD. Chest: Respirations nl with BS clear & equal w/o rales, rhonchi, wheezing or stridor.  Cor: Heart sounds normal w/ regular rate and rhythm without sig. murmurs, gallops, clicks or rubs. Peripheral pulses normal and equal  without edema.  Abdomen: Soft & bowel sounds normal. Non-tender w/o guarding, rebound, hernias, masses or organomegaly.  Lymphatics: Unremarkable.  Musculoskeletal: Full ROM all peripheral extremities, joint stability, 5/5 strength and normal gait.  Skin: Warm, dry without exposed rashes, lesions or ecchymosis apparent.  Neuro: Cranial nerves intact, reflexes equal bilaterally. Sensory-motor testing grossly intact. Tendon reflexes grossly intact.  Pysch: Alert & oriented x 3.  Insight and judgement nl & appropriate. No ideations.  Assessment and Plan:  1. Essential hypertension  - Continue medication, monitor blood pressure at home.  - Continue DASH diet.  Reminder to go to the ER if any CP,  SOB, nausea, dizziness, severe HA, changes vision/speech.  - CBC with Differential/Platelet - COMPLETE METABOLIC PANEL WITH GFR - Magnesium - TSH  2. Hyperlipidemia, mixed  - Continue diet/meds, exercise,& lifestyle modifications.  - Continue monitor periodic cholesterol/liver & renal functions   - Lipid panel - TSH  3. Insulin-requiring or dependent type II diabetes mellitus ([place])  - Hemoglobin A1c  4. Vitamin D deficiency  - Continue diet,  exercise, lifestyle modifications.  - Monitor appropriate labs. - Continue supplementation.  - VITAMIN D 25 Hydroxyl  5. CKD stage 2 due to type 2 diabetes mellitus (HCC)  - COMPLETE METABOLIC PANEL WITH GFR  6. Idiopathic gout  - Uric acid  7. Medication management  - CBC with Differential/Platelet - COMPLETE METABOLIC PANEL WITH GFR - Magnesium - Lipid panel - TSH - Hemoglobin A1c - VITAMIN D 25 Hydroxyl - Uric acid      Discussed  regular exercise, BP monitoring, weight control to  achieve/maintain BMI less than 25 and discussed med and SE's. Recommended labs to assess and monitor clinical status with further disposition pending results of labs. Over 30 minutes of exam, counseling, chart review was performed.

## 2018-05-21 LAB — COMPLETE METABOLIC PANEL WITH GFR
AG Ratio: 1.4 (calc) (ref 1.0–2.5)
ALKALINE PHOSPHATASE (APISO): 98 U/L (ref 40–115)
ALT: 30 U/L (ref 9–46)
AST: 20 U/L (ref 10–35)
Albumin: 4.9 g/dL (ref 3.6–5.1)
BUN/Creatinine Ratio: 22 (calc) (ref 6–22)
BUN: 26 mg/dL — ABNORMAL HIGH (ref 7–25)
CO2: 29 mmol/L (ref 20–32)
CREATININE: 1.18 mg/dL (ref 0.70–1.33)
Calcium: 10.4 mg/dL — ABNORMAL HIGH (ref 8.6–10.3)
Chloride: 94 mmol/L — ABNORMAL LOW (ref 98–110)
GFR, Est African American: 82 mL/min/{1.73_m2} (ref >=60)
GFR, Est Non African American: 71 mL/min/{1.73_m2} (ref >=60)
GLOBULIN: 3.6 g/dL (ref 1.9–3.7)
Glucose, Bld: 300 mg/dL — ABNORMAL HIGH (ref 65–99)
POTASSIUM: 6 mmol/L — AB (ref 3.5–5.3)
SODIUM: 131 mmol/L — AB (ref 135–146)
Total Bilirubin: 0.6 mg/dL (ref 0.2–1.2)
Total Protein: 8.5 g/dL — ABNORMAL HIGH (ref 6.1–8.1)

## 2018-05-21 LAB — HEMOGLOBIN A1C
HEMOGLOBIN A1C: 11 %{Hb} — AB (ref <5.7)
Mean Plasma Glucose: 269 (calc)
eAG (mmol/L): 14.9 (calc)

## 2018-05-21 LAB — LIPID PANEL
CHOL/HDL RATIO: 4.1 (calc) (ref <5.0)
CHOLESTEROL: 126 mg/dL (ref <200)
HDL: 31 mg/dL — ABNORMAL LOW (ref >40)
LDL Cholesterol (Calc): 62 mg/dL (calc)
Non-HDL Cholesterol (Calc): 95 mg/dL (calc) (ref <130)
Triglycerides: 280 mg/dL — ABNORMAL HIGH (ref <150)

## 2018-05-21 LAB — CBC WITH DIFFERENTIAL/PLATELET
BASOS ABS: 103 {cells}/uL (ref 0–200)
Basophils Relative: 1.8 %
Eosinophils Absolute: 182 cells/uL (ref 15–500)
Eosinophils Relative: 3.2 %
HEMATOCRIT: 40.7 % (ref 38.5–50.0)
Hemoglobin: 13.9 g/dL (ref 13.2–17.1)
LYMPHS ABS: 2177 {cells}/uL (ref 850–3900)
MCH: 30.5 pg (ref 27.0–33.0)
MCHC: 34.2 g/dL (ref 32.0–36.0)
MCV: 89.3 fL (ref 80.0–100.0)
MONOS PCT: 8.1 %
MPV: 11.2 fL (ref 7.5–12.5)
NEUTROS PCT: 48.7 %
Neutro Abs: 2776 cells/uL (ref 1500–7800)
PLATELETS: 258 10*3/uL (ref 140–400)
RBC: 4.56 10*6/uL (ref 4.20–5.80)
RDW: 12.2 % (ref 11.0–15.0)
TOTAL LYMPHOCYTE: 38.2 %
WBC mixed population: 462 cells/uL (ref 200–950)
WBC: 5.7 10*3/uL (ref 3.8–10.8)

## 2018-05-21 LAB — TSH: TSH: 2.39 mIU/L (ref 0.40–4.50)

## 2018-05-21 LAB — MAGNESIUM: Magnesium: 1.9 mg/dL (ref 1.5–2.5)

## 2018-05-21 LAB — VITAMIN D 25 HYDROXY (VIT D DEFICIENCY, FRACTURES): Vit D, 25-Hydroxy: 51 ng/mL (ref 30–100)

## 2018-05-21 LAB — URIC ACID: Uric Acid, Serum: 6.1 mg/dL (ref 4.0–8.0)

## 2018-06-18 ENCOUNTER — Other Ambulatory Visit: Payer: Self-pay | Admitting: Internal Medicine

## 2018-07-10 ENCOUNTER — Other Ambulatory Visit: Payer: Self-pay | Admitting: Adult Health

## 2018-07-12 DIAGNOSIS — R0789 Other chest pain: Secondary | ICD-10-CM | POA: Diagnosis not present

## 2018-08-25 DIAGNOSIS — E669 Obesity, unspecified: Secondary | ICD-10-CM | POA: Insufficient documentation

## 2018-08-25 DIAGNOSIS — E66811 Obesity, class 1: Secondary | ICD-10-CM | POA: Insufficient documentation

## 2018-08-25 NOTE — Progress Notes (Deleted)
FOLLOW UP  Assessment and Plan:   Hypertension Well controlled with current medications Monitor blood pressure at home; patient to call if consistently greater than 130/80 Continue DASH diet.   Reminder to go to the ER if any CP, SOB, nausea, dizziness, severe HA, changes vision/speech, left arm numbness and tingling and jaw pain.  Cholesterol Currently at LDL goal on atorvastatin and fenofibrate; trigs remain elevated *** Continue low cholesterol diet and exercise.  Check lipid panel.   Diabetes with diabetic chronic kidney disease Continue medication: metformin, levemir 18 units BID Reminded he needs to get annual diabetic eye exam Encouraged to check glucose twice daily; fasting and rotating post prandial - glucose log given Continue diet and exercise.  Perform daily foot/skin check, notify office of any concerning changes.   CKD stage 2  Increase fluids, avoid NSAIDS, monitor sugars, will monitor BMP/GFR  Obesity with co morbidities Long discussion about weight loss, diet, and exercise Recommended diet heavy in fruits and veggies and low in animal meats, cheeses, and dairy products, appropriate calorie intake Discussed ideal weight for height and initial weight goal (225 lb) Patient on phentermine with benefit and no SE, taking drug breaks; continue close follow up.  Patient will work on continue to cut out pasta, soda, sweet tea -   Will follow up in 3 months  Gout Continue allopurinol Diet discussed Check uric acid as needed  Vitamin D Def Below goal at last visit; *** increase supplementation for goal of 70-100 Defer Vit D level  Continue diet and meds as discussed. Further disposition pending results of labs. Discussed med's effects and SE's.   Over 30 minutes of exam, counseling, chart review, and critical decision making was performed.   Future Appointments  Date Time Provider [place]  08/26/2018  8:45 AM Liane Comber, NP [facility] None   12/02/2018 11:00 AM Unk Pinto, MD [facility] None    ----------------------------------------------------------------------------------------------------------------------  HPI 52 y.o. male  presents for 3 month follow up on hypertension, cholesterol, diabetes, gout, weight and vitamin D deficiency.    he currently continues to smoke *** cigars a day; discussed risks associated with smoking, patient {ACTION; IS/IS [id number]} ready to quit.   he is prescribed phentermine for weight loss.  While on the medication they have lost {NUMBERS [id number]} lbs since last visit. They deny palpitations, anxiety, trouble sleeping, elevated BP.   BMI is There is no height or weight on file to calculate BMI., he is working on diet and exercise. Wt Readings from Last 3 Encounters:  05/20/18 253 lb 12.8 oz (115.1 kg)  01/28/18 256 lb (116.1 kg)  09/17/17 257 lb 12.8 oz (116.9 kg)   Typical breakfast: Typical lunch:  Typical dinner: Exercise:  Water intake:   He does not currently check BPs at home, today their BP is    He does not workout. He denies chest pain, shortness of breath, dizziness.   He is on cholesterol medication (atorvastatin 10 mg daily, fenofibrate 134 mg daily) and denies myalgias. His LDL cholesterol is at goal. The cholesterol last visit was:   Lab Results  Component Value Date   CHOL 126 05/20/2018   HDL 31 (L) 05/20/2018   LDLCALC 62 05/20/2018   TRIG 280 (H) 05/20/2018   CHOLHDL 4.1 05/20/2018    He has been working on diet and exercise for T2 diabetes treated by metformin and levemir which was increased to 18 units BID, and denies foot ulcerations, hypoglycemia , increased appetite, nausea, paresthesia of the feet,  polydipsia, polyuria, visual disturbances, vomiting and weight loss. He does check occasional fasting glucose (range 169-224). Last A1C in the office was:  Lab Results  Component Value Date   HGBA1C 11.0 (H) 05/20/2018   Patient is on Vitamin D  supplement:    Lab Results  Component Value Date   VD25OH 51 05/20/2018     Patient is on allopurinol for gout and does not report a recent flare.  Lab Results  Component Value Date   LABURIC 6.1 05/20/2018      Current Medications:  Current Outpatient Medications on File Prior to Visit  Medication Sig  . allopurinol (ZYLOPRIM) 300 MG tablet TAKE 1 TABLET BY MOUTH EVERY DAY  . aspirin 81 MG tablet Take 81 mg by mouth daily.  Marland Kitchen atorvastatin (LIPITOR) 10 MG tablet Take 1 tablet (10 mg total) by mouth daily.  . benazepril (LOTENSIN) 20 MG tablet TAKE 1 TABLET AT BEDTIME FOR BLOOD PRESSURE AND KIDNEY  . bisoprolol-hydrochlorothiazide (ZIAC) 5-6.25 MG tablet TAKE 1 TABLET BY MOUTH DAILY.  Marland Kitchen Cholecalciferol (VITAMIN D) 2000 units CAPS Take 1 capsule by mouth daily.  . fenofibrate micronized (LOFIBRA) 134 MG capsule Take 1 tablet daily for blood fats  . insulin detemir (LEVEMIR) 100 UNIT/ML injection Inject 18 Units into the skin 2 (two) times daily.   . Magnesium 500 MG TABS Take by mouth daily.  . metFORMIN (GLUCOPHAGE) 1000 MG tablet Take 1 tablet 2 x/ day for Diabetes  . Multiple Vitamin (MULTIVITAMIN WITH MINERALS) TABS tablet Take 1 tablet by mouth daily.  . phentermine (ADIPEX-P) 37.5 MG tablet TAKE 1 CAPSULE BY MOUTH EVERY MORNING  . vitamin B-12 (CYANOCOBALAMIN) 100 MCG tablet Take 100 mcg by mouth daily.   No current facility-administered medications on file prior to visit.      Allergies:  Allergies  Allergen Reactions  . 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 Welchol [Colesevelam Hcl]     Chest pain     Medical History:  Past Medical History:  Diagnosis Date  . Carpal tunnel syndrome on both sides   . Diabetes mellitus type 2, insulin dependent ([place])   . Fatty liver disease, nonalcoholic   . Gout   . Hyperlipidemia   . Hypertension   . Kidney stone   . Vitamin D deficiency    Family history- Reviewed and unchanged Social history-  Reviewed and unchanged   Review of Systems:  Review of Systems  Constitutional: Negative for malaise/fatigue and weight loss.  HENT: Negative for hearing loss and tinnitus.   Eyes: Negative for blurred vision and double vision.  Respiratory: Negative for cough, shortness of breath and wheezing.   Cardiovascular: Negative for chest pain, palpitations, orthopnea, claudication and leg swelling.  Gastrointestinal: Negative for abdominal pain, blood in stool, constipation, diarrhea, heartburn, melena, nausea and vomiting.  Genitourinary: Negative.   Musculoskeletal: Negative for joint pain and myalgias.  Skin: Negative for rash.  Neurological: Negative for dizziness, tingling, sensory change, weakness and headaches.  Endo/Heme/Allergies: Negative for polydipsia.  Psychiatric/Behavioral: Negative.   All other systems reviewed and are negative.   Physical Exam: There were no vitals taken for this visit. Wt Readings from Last 3 Encounters:  05/20/18 253 lb 12.8 oz (115.1 kg)  01/28/18 256 lb (116.1 kg)  09/17/17 257 lb 12.8 oz (116.9 kg)   General Appearance: Well nourished, in no apparent distress. Eyes: PERRLA, EOMs, conjunctiva no swelling or erythema Sinuses: No Frontal/maxillary tenderness ENT/Mouth: Ext aud  canals clear, TMs without erythema, bulging. No erythema, swelling, or exudate on post pharynx.  Tonsils not swollen or erythematous. Hearing normal.  Neck: Supple, thyroid nonpalpable. Left anterior lump, approx 2-3 cm, smooth, non-tender.  Respiratory: Respiratory effort normal, BS equal bilaterally without rales, rhonchi, wheezing or stridor.  Cardio: RRR with no MRGs. Brisk peripheral pulses without edema.  Abdomen: Soft, + BS.  Non tender, no guarding, rebound, hernias, masses. Lymphatics: Non tender without lymphadenopathy.  Musculoskeletal: Full ROM, 5/5 strength, Normal gait Skin: Warm, dry without rashes, lesions, ecchymosis.  Neuro: Cranial nerves intact. No  cerebellar symptoms.  Psych: Awake and oriented X 3, normal affect, Insight and Judgment appropriate.    Izora Ribas, NP 8:06 AM [facility]

## 2018-08-26 ENCOUNTER — Ambulatory Visit: Payer: Self-pay | Admitting: Adult Health

## 2018-09-15 NOTE — Progress Notes (Signed)
FOLLOW UP  Assessment and Plan:   Hypertension Well controlled with current medications Monitor blood pressure at home; patient to call if consistently greater than 130/80 Continue DASH diet.   Reminder to go to the ER if any CP, SOB, nausea, dizziness, severe HA, changes vision/speech, left arm numbness and tingling and jaw pain.  Cholesterol Currently at LDL goal on atorvastatin and fenofibrate; trigs remain elevated, lifestyle discussed Continue low cholesterol diet and exercise.  Check lipid panel.   Diabetes with diabetic chronic kidney disease Continue medication: metformin, levemir 12 units daily with plan to increase insulin for goal of fasting glucose <150. Off of victoza due to cost, consider jardiance or farxiga Reminded he needs to get annual diabetic eye exam Encouraged to check glucose  daily; at minimum needs to check fasting values, keep log and bring to next visit Continue diet and exercise.  Perform daily foot/skin check, notify office of any concerning changes.   Poorly controlled diabetes with peripheral neuropathy Foot exam completed today, has callus, decreased sensation through toes Risks and daily foot care recommendations discussed He declines podiatry referral today Perform daily foot/skin check, notify office of any concerning changes.   CKD stage 2  Increase fluids, avoid NSAIDS, monitor sugars, will monitor BMP/GFR  Obesity with co morbidities Long discussion about weight loss, diet, and exercise Recommended diet heavy in fruits and veggies and low in animal meats, cheeses, and dairy products, appropriate calorie intake Discussed ideal weight for height and initial weight goal (240 lb) Patient on phentermine with benefit and no SE, taking drug breaks; continue close follow up.  Patient has successfully cut out sweet tea, soda; recommended he switch from sweetened creamer/honey in coffee, try to eat more regular meals Patient on phentermine with  benefit and no SE, needs to take more consistently, is taking drug breaks; continue close follow up.   Will follow up in 3 months  Gout Continue allopurinol Diet discussed Check uric acid as needed  Vitamin D Def Below goal at last visit; he has increased dose increase supplementation for goal of 70-100 Check Vit D level  Continue diet and meds as discussed. Further disposition pending results of labs. Discussed med's effects and SE's.   Over 30 minutes of exam, counseling, chart review, and critical decision making was performed.   Future Appointments  Date Time Provider [place]  12/02/2018 11:00 AM Unk Pinto, MD [facility] None    ----------------------------------------------------------------------------------------------------------------------  HPI 52 y.o. male  presents for 3 month follow up on hypertension, cholesterol, diabetes, gout, weight and vitamin D deficiency.    he is no longer smoking cigars, quit 6 months ago.   he is prescribed phentermine for weight loss but admits he he only takes a few times weekly.  While on the medication they have lost 1 lbs since last visit. They deny palpitations, anxiety, trouble sleeping, elevated BP.   BMI is Body mass index is 33.68 kg/m., he is working on diet and exercise, mainly doing lots of yardwork for his home and his father's house, getting a bike and plans to start riding. He had been trying to eat more salads, having grilled lean proteins (fish/chicken), avoiding fried foods.  Wt Readings from Last 3 Encounters:  09/16/18 258 lb 12.8 oz (117.4 kg)  05/20/18 253 lb 12.8 oz (115.1 kg)  01/28/18 256 lb (116.1 kg)   Typical breakfast: cup of coffee with splenda and does add a sweetened creamer with caramel, and at work has another cup with honey  Snack: 2 boiled eggs Typical lunch: salad with chicken or shrimp, sauteed veggies, adds 2 oz of ranch Typical dinner: may not eat, may have a handful of nuts and  takes home a garden salad if he gets Water intake: Drinks water, off of sweet tea and cola for the most part, estimates 30 oz x 5  He does not currently check BPs at home, today their BP is BP: 112/76  He does not workout. He denies chest pain, shortness of breath, dizziness.   He is on cholesterol medication (atorvastatin 10 mg daily, fenofibrate 134 mg daily) and denies myalgias. His LDL cholesterol is at goal. The cholesterol last visit was:   Lab Results  Component Value Date   CHOL 126 05/20/2018   HDL 31 (L) 05/20/2018   LDLCALC 62 05/20/2018   TRIG 280 (H) 05/20/2018   CHOLHDL 4.1 05/20/2018    He has been working on diet and exercise for T2 diabetes treated by metformin and levemir 12 units once daily, and denies foot ulcerations, hypoglycemia , increased appetite, nausea, paresthesia of the feet, polydipsia, polyuria, visual disturbances, vomiting and weight loss. He does not currently check sugars at home. He has regular visit with pedicurist who files down calluses and trims toenails. Last A1C in the office was:  Lab Results  Component Value Date   HGBA1C 11.0 (H) 05/20/2018   Patient is on Vitamin D supplement:    Lab Results  Component Value Date   VD25OH 51 05/20/2018     Patient is on allopurinol for gout and does not report a recent flare.  Lab Results  Component Value Date   LABURIC 6.1 05/20/2018      Current Medications:  Current Outpatient Medications on File Prior to Visit  Medication Sig  . allopurinol (ZYLOPRIM) 300 MG tablet TAKE 1 TABLET BY MOUTH EVERY DAY  . aspirin 81 MG tablet Take 81 mg by mouth daily.  Marland Kitchen atorvastatin (LIPITOR) 10 MG tablet Take 1 tablet (10 mg total) by mouth daily.  . benazepril (LOTENSIN) 20 MG tablet TAKE 1 TABLET AT BEDTIME FOR BLOOD PRESSURE AND KIDNEY  . bisoprolol-hydrochlorothiazide (ZIAC) 5-6.25 MG tablet TAKE 1 TABLET BY MOUTH DAILY.  Marland Kitchen Cholecalciferol (VITAMIN D) 2000 units CAPS Take 1 capsule by mouth daily.  .  insulin detemir (LEVEMIR) 100 UNIT/ML injection Inject 18 Units into the skin 2 (two) times daily.   . Magnesium 500 MG TABS Take by mouth daily.  . metFORMIN (GLUCOPHAGE) 1000 MG tablet Take 1 tablet 2 x/ day for Diabetes  . Multiple Vitamin (MULTIVITAMIN WITH MINERALS) TABS tablet Take 1 tablet by mouth daily.  . phentermine (ADIPEX-P) 37.5 MG tablet TAKE 1 CAPSULE BY MOUTH EVERY MORNING  . vitamin B-12 (CYANOCOBALAMIN) 100 MCG tablet Take 100 mcg by mouth daily.  . fenofibrate micronized (LOFIBRA) 134 MG capsule Take 1 tablet daily for blood fats   No current facility-administered medications on file prior to visit.      Allergies:  Allergies  Allergen Reactions  . 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 Welchol [Colesevelam Hcl]     Chest pain     Medical History:  Past Medical History:  Diagnosis Date  . Carpal tunnel syndrome on both sides   . Diabetes mellitus type 2, insulin dependent ([place])   . Fatty liver disease, nonalcoholic   . Gout   . Hyperlipidemia   . Hypertension   . Kidney stone   . Vitamin D  deficiency    Family history- Reviewed and unchanged Social history- Reviewed and unchanged   Review of Systems:  Review of Systems  Constitutional: Negative for malaise/fatigue and weight loss.  HENT: Negative for hearing loss and tinnitus.   Eyes: Negative for blurred vision and double vision.  Respiratory: Negative for cough, shortness of breath and wheezing.   Cardiovascular: Negative for chest pain, palpitations, orthopnea, claudication and leg swelling.  Gastrointestinal: Negative for abdominal pain, blood in stool, constipation, diarrhea, heartburn, melena, nausea and vomiting.  Genitourinary: Negative.   Musculoskeletal: Negative for joint pain and myalgias.  Skin: Negative for rash.  Neurological: Negative for dizziness, tingling, sensory change, weakness and headaches.  Endo/Heme/Allergies: Negative for polydipsia.   Psychiatric/Behavioral: Negative.   All other systems reviewed and are negative.   Physical Exam: BP 112/76   Pulse 67   Temp (!) 97 F (36.1 C)   Ht 6' 1.5" (1.867 m)   Wt 258 lb 12.8 oz (117.4 kg)   SpO2 97%   BMI 33.68 kg/m  Wt Readings from Last 3 Encounters:  09/16/18 258 lb 12.8 oz (117.4 kg)  05/20/18 253 lb 12.8 oz (115.1 kg)  01/28/18 256 lb (116.1 kg)   General Appearance: Well nourished, in no apparent distress. Eyes: PERRLA, EOMs, conjunctiva no swelling or erythema Sinuses: No Frontal/maxillary tenderness ENT/Mouth: Ext aud canals clear, TMs without erythema, bulging. No erythema, swelling, or exudate on post pharynx.  Tonsils not swollen or erythematous. Hearing normal.  Neck: Supple, thyroid nonpalpable. Left anterior lump, approx 2-3 cm, smooth, non-tender.  Respiratory: Respiratory effort normal, BS equal bilaterally without rales, rhonchi, wheezing or stridor.  Cardio: RRR with no MRGs. Brisk peripheral pulses without edema.  Abdomen: Soft, + BS.  Non tender, no guarding, rebound, hernias, masses. Lymphatics: Non tender without lymphadenopathy.  Musculoskeletal: Full ROM, 5/5 strength, Normal gait Skin: Warm, dry without rashes, lesions, ecchymosis. He has thick callus without breakdown to left digit medial surface.  Neuro: Cranial nerves intact. No cerebellar symptoms. Sensation decreased to bilateral feet on dorsal surfaces of toes.  Psych: Awake and oriented X 3, normal affect, Insight and Judgment appropriate.    Izora Ribas, NP 11:50 AM [facility]

## 2018-09-16 ENCOUNTER — Encounter: Payer: Self-pay | Admitting: Adult Health

## 2018-09-16 ENCOUNTER — Ambulatory Visit (INDEPENDENT_AMBULATORY_CARE_PROVIDER_SITE_OTHER): Payer: BLUE CROSS/BLUE SHIELD | Admitting: Adult Health

## 2018-09-16 VITALS — BP 112/76 | HR 67 | Temp 97.0°F | Ht 73.5 in | Wt 258.8 lb

## 2018-09-16 DIAGNOSIS — E1122 Type 2 diabetes mellitus with diabetic chronic kidney disease: Secondary | ICD-10-CM

## 2018-09-16 DIAGNOSIS — E1142 Type 2 diabetes mellitus with diabetic polyneuropathy: Secondary | ICD-10-CM | POA: Insufficient documentation

## 2018-09-16 DIAGNOSIS — E559 Vitamin D deficiency, unspecified: Secondary | ICD-10-CM | POA: Diagnosis not present

## 2018-09-16 DIAGNOSIS — M1000 Idiopathic gout, unspecified site: Secondary | ICD-10-CM

## 2018-09-16 DIAGNOSIS — I10 Essential (primary) hypertension: Secondary | ICD-10-CM

## 2018-09-16 DIAGNOSIS — E669 Obesity, unspecified: Secondary | ICD-10-CM

## 2018-09-16 DIAGNOSIS — Z79899 Other long term (current) drug therapy: Secondary | ICD-10-CM | POA: Diagnosis not present

## 2018-09-16 DIAGNOSIS — F17200 Nicotine dependence, unspecified, uncomplicated: Secondary | ICD-10-CM

## 2018-09-16 DIAGNOSIS — E1169 Type 2 diabetes mellitus with other specified complication: Secondary | ICD-10-CM

## 2018-09-16 DIAGNOSIS — N182 Chronic kidney disease, stage 2 (mild): Secondary | ICD-10-CM

## 2018-09-16 DIAGNOSIS — I129 Hypertensive chronic kidney disease with stage 1 through stage 4 chronic kidney disease, or unspecified chronic kidney disease: Secondary | ICD-10-CM

## 2018-09-16 DIAGNOSIS — Z23 Encounter for immunization: Secondary | ICD-10-CM

## 2018-09-16 DIAGNOSIS — E1165 Type 2 diabetes mellitus with hyperglycemia: Secondary | ICD-10-CM

## 2018-09-16 DIAGNOSIS — E785 Hyperlipidemia, unspecified: Secondary | ICD-10-CM

## 2018-09-16 NOTE — Patient Instructions (Addendum)
Goals    . Fasting Blood Glucose <130    . HEMOGLOBIN A1C < 8    . Weight (lb) < 240 lb (108.9 kg)     Check weight weekly       Please start checking your sugars in the morning - ideally every day, but at minimum a few times a week. Goal is for this to be <130, normal/ideal is <100 but 70<  Recommend stopping sugary creamer, can get sugar free type Recommend stopping honey in coffee, switch to sweetener  Please make sure to check feet daily, let me know of any concerning changes   Diabetes and Foot Care Diabetes may cause you to have problems because of poor blood supply (circulation) to your feet and legs. This may cause the skin on your feet to become thinner, break easier, and heal more slowly. Your skin may become dry, and the skin may peel and crack. You may also have nerve damage in your legs and feet causing decreased feeling in them. You may not notice minor injuries to your feet that could lead to infections or more serious problems. Taking care of your feet is one of the most important things you can do for yourself. Follow these instructions at home:  Wear shoes at all times, even in the house. Do not go barefoot. Bare feet are easily injured.  Check your feet daily for blisters, cuts, and redness. If you cannot see the bottom of your feet, use a mirror or ask someone for help.  Wash your feet with warm water (do not use hot water) and mild soap. Then pat your feet and the areas between your toes until they are completely dry. Do not soak your feet as this can dry your skin.  Apply a moisturizing lotion or petroleum jelly (that does not contain alcohol and is unscented) to the skin on your feet and to dry, brittle toenails. Do not apply lotion between your toes.  Trim your toenails straight across. Do not dig under them or around the cuticle. File the edges of your nails with an emery board or nail file.  Do not cut corns or calluses or try to remove them with  medicine.  Wear clean socks or stockings every day. Make sure they are not too tight. Do not wear knee-high stockings since they may decrease blood flow to your legs.  Wear shoes that fit properly and have enough cushioning. To break in new shoes, wear them for just a few hours a day. This prevents you from injuring your feet. Always look in your shoes before you put them on to be sure there are no objects inside.  Do not cross your legs. This may decrease the blood flow to your feet.  If you find a minor scrape, cut, or break in the skin on your feet, keep it and the skin around it clean and dry. These areas may be cleansed with mild soap and water. Do not cleanse the area with peroxide, alcohol, or iodine.  When you remove an adhesive bandage, be sure not to damage the skin around it.  If you have a wound, look at it several times a day to make sure it is healing.  Do not use heating pads or hot water bottles. They may burn your skin. If you have lost feeling in your feet or legs, you may not know it is happening until it is too late.  Make sure your health care provider performs a complete  foot exam at least annually or more often if you have foot problems. Report any cuts, sores, or bruises to your health care provider immediately. Contact a health care provider if:  You have an injury that is not healing.  You have cuts or breaks in the skin.  You have an ingrown nail.  You notice redness on your legs or feet.  You feel burning or tingling in your legs or feet.  You have pain or cramps in your legs and feet.  Your legs or feet are numb.  Your feet always feel cold. Get help right away if:  There is increasing redness, swelling, or pain in or around a wound.  There is a red line that goes up your leg.  Pus is coming from a wound.  You develop a fever or as directed by your health care provider.  You notice a bad smell coming from an ulcer or wound. This information is  not intended to replace advice given to you by your health care provider. Make sure you discuss any questions you have with your health care provider. Document Released: 10/26/2000 Document Revised: 04/05/2016 Document Reviewed: 04/07/2013 Elsevier Interactive Patient Education  2017 [employer].

## 2018-09-17 ENCOUNTER — Other Ambulatory Visit: Payer: Self-pay | Admitting: Adult Health

## 2018-09-17 LAB — COMPLETE METABOLIC PANEL WITH GFR
AG RATIO: 1.3 (calc) (ref 1.0–2.5)
ALBUMIN MSPROF: 4.2 g/dL (ref 3.6–5.1)
ALKALINE PHOSPHATASE (APISO): 101 U/L (ref 40–115)
ALT: 30 U/L (ref 9–46)
AST: 24 U/L (ref 10–35)
BUN: 15 mg/dL (ref 7–25)
CO2: 26 mmol/L (ref 20–32)
CREATININE: 0.95 mg/dL (ref 0.70–1.33)
Calcium: 9.9 mg/dL (ref 8.6–10.3)
Chloride: 104 mmol/L (ref 98–110)
GFR, Est African American: 106 mL/min/{1.73_m2} (ref >=60)
GFR, Est Non African American: 92 mL/min/{1.73_m2} (ref >=60)
GLOBULIN: 3.2 g/dL (ref 1.9–3.7)
Glucose, Bld: 170 mg/dL — ABNORMAL HIGH (ref 65–99)
POTASSIUM: 4.6 mmol/L (ref 3.5–5.3)
Sodium: 137 mmol/L (ref 135–146)
Total Bilirubin: 0.4 mg/dL (ref 0.2–1.2)
Total Protein: 7.4 g/dL (ref 6.1–8.1)

## 2018-09-17 LAB — CBC WITH DIFFERENTIAL/PLATELET
BASOS ABS: 78 {cells}/uL (ref 0–200)
Basophils Relative: 1.2 %
Eosinophils Absolute: 273 cells/uL (ref 15–500)
Eosinophils Relative: 4.2 %
HCT: 37.4 % — ABNORMAL LOW (ref 38.5–50.0)
HEMOGLOBIN: 12.6 g/dL — AB (ref 13.2–17.1)
LYMPHS ABS: 2516 {cells}/uL (ref 850–3900)
MCH: 30.7 pg (ref 27.0–33.0)
MCHC: 33.7 g/dL (ref 32.0–36.0)
MCV: 91 fL (ref 80.0–100.0)
MONOS PCT: 8.8 %
MPV: 10.7 fL (ref 7.5–12.5)
NEUTROS ABS: 3062 {cells}/uL (ref 1500–7800)
NEUTROS PCT: 47.1 %
Platelets: 272 10*3/uL (ref 140–400)
RBC: 4.11 10*6/uL — ABNORMAL LOW (ref 4.20–5.80)
RDW: 12.1 % (ref 11.0–15.0)
Total Lymphocyte: 38.7 %
WBC mixed population: 572 cells/uL (ref 200–950)
WBC: 6.5 10*3/uL (ref 3.8–10.8)

## 2018-09-17 LAB — HEMOGLOBIN A1C
EAG (MMOL/L): 11.7 (calc)
Hgb A1c MFr Bld: 9 % of total Hgb — ABNORMAL HIGH (ref <5.7)
MEAN PLASMA GLUCOSE: 212 (calc)

## 2018-09-17 LAB — LIPID PANEL
CHOL/HDL RATIO: 3.1 (calc) (ref <5.0)
Cholesterol: 129 mg/dL (ref <200)
HDL: 41 mg/dL (ref >40)
LDL CHOLESTEROL (CALC): 67 mg/dL
Non-HDL Cholesterol (Calc): 88 mg/dL (calc) (ref <130)
Triglycerides: 129 mg/dL (ref <150)

## 2018-09-17 LAB — MAGNESIUM: MAGNESIUM: 1.7 mg/dL (ref 1.5–2.5)

## 2018-09-17 LAB — VITAMIN D 25 HYDROXY (VIT D DEFICIENCY, FRACTURES): Vit D, 25-Hydroxy: 41 ng/mL (ref 30–100)

## 2018-09-17 LAB — TSH: TSH: 1.72 mIU/L (ref 0.40–4.50)

## 2018-09-17 MED ORDER — INSULIN DETEMIR 100 UNIT/ML ~~LOC~~ SOLN: SUBCUTANEOUS | 3 refills | Status: DC

## 2018-09-17 MED ORDER — VITAMIN D 50 MCG (2000 UT) PO CAPS: 2.0000 | ORAL_CAPSULE | Freq: Every day | ORAL | Status: DC

## 2018-09-20 ENCOUNTER — Other Ambulatory Visit: Payer: Self-pay | Admitting: Internal Medicine

## 2018-09-22 ENCOUNTER — Encounter: Payer: Self-pay | Admitting: Internal Medicine

## 2018-09-30 ENCOUNTER — Other Ambulatory Visit: Payer: BLUE CROSS/BLUE SHIELD

## 2018-09-30 ENCOUNTER — Other Ambulatory Visit: Payer: Self-pay | Admitting: Internal Medicine

## 2018-09-30 DIAGNOSIS — D649 Anemia, unspecified: Secondary | ICD-10-CM

## 2018-09-30 DIAGNOSIS — E669 Obesity, unspecified: Secondary | ICD-10-CM

## 2018-09-30 MED ORDER — PHENTERMINE HCL 37.5 MG PO TABS: ORAL_TABLET | ORAL | 5 refills | Status: DC

## 2018-10-01 DIAGNOSIS — I10 Essential (primary) hypertension: Secondary | ICD-10-CM | POA: Diagnosis not present

## 2018-10-01 LAB — CBC WITH DIFFERENTIAL/PLATELET
BASOS PCT: 1.3 %
Basophils Absolute: 81 cells/uL (ref 0–200)
EOS ABS: 310 {cells}/uL (ref 15–500)
EOS PCT: 5 %
HCT: 38.1 % — ABNORMAL LOW (ref 38.5–50.0)
HEMOGLOBIN: 12.9 g/dL — AB (ref 13.2–17.1)
Lymphs Abs: 2096 cells/uL (ref 850–3900)
MCH: 30.8 pg (ref 27.0–33.0)
MCHC: 33.9 g/dL (ref 32.0–36.0)
MCV: 90.9 fL (ref 80.0–100.0)
MONOS PCT: 8.8 %
MPV: 10.9 fL (ref 7.5–12.5)
NEUTROS ABS: 3168 {cells}/uL (ref 1500–7800)
Neutrophils Relative %: 51.1 %
PLATELETS: 274 10*3/uL (ref 140–400)
RBC: 4.19 10*6/uL — AB (ref 4.20–5.80)
RDW: 12.4 % (ref 11.0–15.0)
Total Lymphocyte: 33.8 %
WBC mixed population: 546 cells/uL (ref 200–950)
WBC: 6.2 10*3/uL (ref 3.8–10.8)

## 2018-10-01 LAB — TEST AUTHORIZATION

## 2018-10-01 LAB — IRON,TIBC AND FERRITIN PANEL

## 2018-11-03 ENCOUNTER — Encounter: Payer: Self-pay | Admitting: Internal Medicine

## 2018-12-01 ENCOUNTER — Encounter: Payer: Self-pay | Admitting: Internal Medicine

## 2018-12-01 NOTE — Patient Instructions (Signed)

## 2018-12-01 NOTE — Progress Notes (Signed)
[facility]   Unk Pinto, M.D.     Uvaldo Bristle. Silverio Lay, P.A.-C Liane Comber, [place]                [place] [address] Telephone [phone] Telefax [phone] Annual  Screening/Preventative Visit  & Comprehensive Evaluation & Examination     This very nice 53 y.o. MBM presents for a Screening /Preventative Visit & comprehensive evaluation and management of multiple medical co-morbidities.  Patient has been followed for HTN, HLD, T2_IDDM  and Vitamin D Deficiency. Patient's Gout is controlled on his meds.      HTN predates since 2007. Patient's BP has been controlled at home.  Today's BP is at goal - 134/82. Patient denies any cardiac symptoms as chest pain, palpitations, shortness of breath, dizziness or ankle swelling.     Patient's hyperlipidemia is controlled with diet and medications. Patient denies myalgias or other medication SE's. Last lipids were at goal: Lab Results  Component Value Date   CHOL 129 09/16/2018   HDL 41 09/16/2018   LDLCALC 67 09/16/2018   TRIG 129 09/16/2018   CHOLHDL 3.1 09/16/2018      Patient has Morbid Obesity (BMI 32+) and insulin requiring T2_DM (2007) and patient denies reactive hypoglycemic symptoms, visual blurring, diabetic polys or paresthesias. 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 allopurinol (ZYLOPRIM) 300 MG tablet TAKE 1 TABLET BY MOUTH EVERY DAY  . aspirin 81 MG tablet Take 81 mg by mouth daily.  Marland Kitchen atorvastatin (LIPITOR) 10 MG tablet Take 1 tablet (10  mg total) by mouth daily.  . benazepril (LOTENSIN) 20 MG tablet TAKE 1 TABLET AT BEDTIME FOR BLOOD PRESSURE AND KIDNEY  . bisoprolol-hydrochlorothiazide (ZIAC) 5-6.25 MG tablet TAKE 1 TABLET BY MOUTH DAILY.  Marland Kitchen Cholecalciferol (VITAMIN D) 50 MCG (2000 UT) CAPS Take 2 capsules (4,000 Units total) by mouth daily.  . insulin detemir (LEVEMIR) 100 UNIT/ML injection Increase to 15 units subcutaneously daily, then if fasting sugars still 150+ in 2 weeks increase to 18 units.  . Magnesium 500 MG TABS Take by mouth daily.  . metFORMIN (GLUCOPHAGE) 1000 MG tablet Take 1 tablet 2 x/ day for Diabetes  . Multiple Vitamin (MULTIVITAMIN WITH MINERALS) TABS tablet Take 1 tablet by mouth daily.  . phentermine (ADIPEX-P) 37.5 MG tablet Take 1/2 to 1 tablet every morning for dieting & weight  loss  . vitamin B-12 (CYANOCOBALAMIN) 100 MCG tablet Take 100 mcg by mouth daily.  . fenofibrate micronized (LOFIBRA) 134 MG capsule Take 1 tablet daily for blood fats   No current facility-administered medications on file prior to visit.    Allergies  Allergen Reactions  . 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 Welchol [Colesevelam Hcl]     Chest pain   Past Medical History:  Diagnosis Date  . Carpal tunnel syndrome on both sides   .  Diabetes mellitus type 2, insulin dependent ([place])   . Fatty liver disease, nonalcoholic   . Gout   . Hyperlipidemia   . Hypertension   . Kidney stone   . Vitamin D deficiency    Health Maintenance  Topic Date Due  . COLONOSCOPY  10/26/2015  . OPHTHALMOLOGY EXAM  02/14/2016  . HEMOGLOBIN A1C  03/17/2019  . FOOT EXAM  12/02/2019  . TETANUS/TDAP  09/18/2027  . INFLUENZA VACCINE  Completed  . PNEUMOCOCCAL POLYSACCHARIDE VACCINE AGE 71-64 HIGH RISK  Completed  . HIV Screening  Completed   Immunization History  Administered Date(s) Administered  . Influenza Inj Mdck Quad With Preservative 09/17/2017, 09/16/2018  . Influenza Split 09/05/2015  .  Influenza,inj,quad, With Preservative 10/22/2016  . Pneumococcal Polysaccharide-23 09/05/2015  . Pneumococcal-Unspecified 11/22/2009  . Td 09/17/2017  . Tdap 11/22/2006   Last Colon -  Past Surgical History:  Procedure Laterality Date  . CYSTOSCOPY WITH RETROGRADE PYELOGRAM, URETEROSCOPY AND STENT PLACEMENT Right 02/01/2015   Procedure: CYSTOSCOPY WITH RETROGRADE PYELOGRAM, URETEROSCOPY, DIGITAL URETEROSCOPY with [place];  Surgeon: Alexis Frock, MD;  Location: [facility];  Service: Urology;  Laterality: Right;  . HOLMIUM LASER APPLICATION Right 8/56/3149   Procedure: HOLMIUM LASER APPLICATION;  Surgeon: Alexis Frock, MD;  Location[facility];  Service: Urology;  Laterality: Right;   Family History  Problem Relation Age of Onset  . Hypertension Mother   . Diabetes Mother   . Asthma Mother   . Diabetes Father   . Hypertension Father   . Stroke Father   . Hypertension Sister   . Hyperlipidemia Sister    Social History   Socioeconomic History  . Marital status: Divorced    Spouse name: Not on file  . Number of children: Not on file  . Years of education: Not on file  . Highest education level: Not on file  Occupational History  . Not on file  Social Needs  . Financial resource strain: Not on file  . Food insecurity:    Worry: Not on file    Inability: Not on file  . Transportation needs:    Medical: Not on file    Non-medical: Not on file  Tobacco Use  . Smoking status: Former Smoker    Types: Cigars    Last attempt to quit: 04/23/2018    Years since quitting: 0.6  . Smokeless tobacco: Never Used  Substance and Sexual Activity  . Alcohol use: Yes    Comment: social  . Drug use: No  . Sexual activity: Not on file  Lifestyle  . Physical activity:    Days per week: Not on file    Minutes per session: Not on file  . Stress: Not on file  Relationships  . Social connections:    Talks on phone: Not on  file    Gets together: Not on file    Attends religious service: Not on file    Active member of club or organization: Not on file    Attends meetings of clubs or organizations: Not on file    Relationship status: Not on file  . Intimate partner violence:    Fear of current or ex partner: Not on file    Emotionally abused: Not on file    Physically abused: Not on file    Forced sexual activity: Not on file  Other Topics Concern  . Not on file  Social History Narrative  . Not on file  ROS Constitutional: Denies fever, chills, weight loss/gain, headaches, insomnia,  night sweats or change in appetite. Does c/o fatigue. Eyes: Denies redness, blurred vision, diplopia, discharge, itchy or watery eyes.  ENT: Denies discharge, congestion, post nasal drip, epistaxis, sore throat, earache, hearing loss, dental pain, Tinnitus, Vertigo, Sinus pain or snoring.  Cardio: Denies chest pain, palpitations, irregular heartbeat, syncope, dyspnea, diaphoresis, orthopnea, PND, claudication or edema Respiratory: denies cough, dyspnea, DOE, pleurisy, hoarseness, laryngitis or wheezing.  Gastrointestinal: Denies dysphagia, heartburn, reflux, water brash, pain, cramps, nausea, vomiting, bloating, diarrhea, constipation, hematemesis, melena, hematochezia, jaundice or hemorrhoids Genitourinary: Denies dysuria, frequency, urgency, nocturia, hesitancy, discharge, hematuria or flank pain Musculoskeletal: Denies arthralgia, myalgia, stiffness, Jt. Swelling, pain, limp or strain/sprain. Denies Falls. Skin: Denies puritis, rash, hives, warts, acne, eczema or change in skin lesion Neuro: No weakness, tremor, incoordination, spasms, paresthesia or pain Psychiatric: Denies confusion, memory loss or sensory loss. Denies Depression. Endocrine: Denies change in weight, skin, hair change, nocturia, and paresthesia, diabetic polys, visual blurring or hyper / hypo glycemic episodes.  Heme/Lymph: No excessive bleeding, bruising  or enlarged lymph nodes.  Physical Exam  BP 134/82   Pulse 74   Temp 98.7 F (37.1 C)   Ht 6' 1.5" (1.867 m)   Wt 255 lb (115.7 kg)   SpO2 98%   BMI 33.19 kg/m   General Appearance: Over nourished and well groomed and in no apparent distress.  Eyes: PERRLA, EOMs, conjunctiva no swelling or erythema, normal fundi and vessels. Sinuses: No frontal/maxillary tenderness ENT/Mouth: EACs patent / TMs  nl. Nares clear without erythema, swelling, mucoid exudates. Oral hygiene is good. No erythema, swelling, or exudate. Tongue normal, non-obstructing. Tonsils not swollen or erythematous. Hearing normal.  Neck: Supple, thyroid not palpable. No bruits, nodes or JVD. Respiratory: Respiratory effort normal.  BS equal and clear bilateral without rales, rhonci, wheezing or stridor. Cardio: Heart sounds are normal with regular rate and rhythm and no murmurs, rubs or gallops. Peripheral pulses are normal and equal bilaterally without edema. No aortic or femoral bruits. Chest: symmetric with normal excursions and percussion.  Abdomen: Soft, with Nl bowel sounds. Nontender, no guarding, rebound, hernias, masses, or organomegaly.  Lymphatics: Non tender without lymphadenopathy.  Genitourinary: No hernias.Testes nl. DRE - prostate nl for age - smooth & firm w/o nodules. Musculoskeletal: Full ROM all peripheral extremities, joint stability, 5/5 strength, and normal gait. Skin: Warm and dry without rashes, lesions, cyanosis, clubbing or  ecchymosis.  Neuro: Cranial nerves intact, reflexes equal bilaterally. Normal muscle tone, no cerebellar symptoms. Sensation intact.  Pysch: Alert and oriented X 3 with normal affect, insight and judgment appropriate.   Assessment and Plan  1. Annual Preventative/Screening Exam   2. Essential hypertension  - EKG 12-Lead - [place], RETROPERITNL ABD,  LTD - Urinalysis, Routine w reflex microscopic - Microalbumin / creatinine urine ratio - CBC with Differential/Platelet -  COMPLETE METABOLIC PANEL WITH GFR - Magnesium - TSH  3. Hyperlipidemia, mixed  - EKG 12-Lead - [place], RETROPERITNL ABD,  LTD - Lipid panel - TSH  4. Type 2 diabetes mellitus with stage 2 chronic kidney disease, with long-term current use of insulin (HCC)  - EKG 12-Lead - [place], RETROPERITNL ABD,  LTD - Urinalysis, Routine w reflex microscopic - Microalbumin / creatinine urine ratio - HM DIABETES FOOT EXAM - LOW EXTREMITY NEUR EXAM DOCUM - COMPLETE METABOLIC PANEL WITH GFR - Hemoglobin A1c  5. Vitamin D deficiency  - VITAMIN D 25 Hydroxyl  6. Idiopathic gout  - Uric acid  7. Obesity (BMI 30.0-34.9)  - Hemoglobin A1c  - Rx Phentermine 37.5 mg - Take 1/2 -1 tab qam & - Rx Topiramate 50 mg - take 1/2 - 1 tab 2 x /day  8. Testosterone deficiency  - Testosterone  9. Screening for colorectal cancer  - POC Hemoccult Bld/Stl  10. Prostate cancer screening  - PSA  11. Screening for ischemic heart disease  - EKG 12-Lead - Lipid panel  12. FHx: heart disease  - EKG 12-Lead - [place], RETROPERITNL ABD,  LTD  13. Former smoker  - EKG 12-Lead - [place], RETROPERITNL ABD,  LTD  14. Abdominal aortic atherosclerosis ([place])  - [place], RETROPERITNL ABD,  LTD  15. Screening for AAA (aortic abdominal aneurysm)  - [place], RETROPERITNL ABD,  LTD  16. Fatigue, unspecified type  - Iron,Total/Total Iron Binding Cap - Vitamin B12 - TSH  17. Medication management  - Urinalysis, Routine w reflex microscopic - Microalbumin / creatinine urine ratio - Uric acid - CBC with Differential/Platelet - COMPLETE METABOLIC PANEL WITH GFR - Magnesium - Lipid panel - TSH - Hemoglobin A1c - VITAMIN D 25 Hydroxyl        Patient was counseled in prudent diet, weight control to achieve/maintain BMI less than 25, BP monitoring, regular exercise and medications as discussed.  Discussed med effects and SE's. Routine screening labs and tests as requested with regular follow-up as recommended. Over 40  minutes of exam, counseling, chart review and high complex critical decision making was performed

## 2018-12-02 ENCOUNTER — Encounter: Payer: Self-pay | Admitting: Internal Medicine

## 2018-12-02 ENCOUNTER — Ambulatory Visit (INDEPENDENT_AMBULATORY_CARE_PROVIDER_SITE_OTHER): Payer: BLUE CROSS/BLUE SHIELD | Admitting: Internal Medicine

## 2018-12-02 VITALS — BP 134/82 | HR 74 | Temp 98.7°F | Ht 73.5 in | Wt 255.0 lb

## 2018-12-02 DIAGNOSIS — Z125 Encounter for screening for malignant neoplasm of prostate: Secondary | ICD-10-CM

## 2018-12-02 DIAGNOSIS — E66811 Obesity, class 1: Secondary | ICD-10-CM

## 2018-12-02 DIAGNOSIS — Z794 Long term (current) use of insulin: Secondary | ICD-10-CM

## 2018-12-02 DIAGNOSIS — Z1389 Encounter for screening for other disorder: Secondary | ICD-10-CM

## 2018-12-02 DIAGNOSIS — E669 Obesity, unspecified: Secondary | ICD-10-CM

## 2018-12-02 DIAGNOSIS — Z130 Encounter for screening for diseases of the blood and blood-forming organs and certain disorders involving the immune mechanism: Secondary | ICD-10-CM | POA: Diagnosis not present

## 2018-12-02 DIAGNOSIS — N401 Enlarged prostate with lower urinary tract symptoms: Secondary | ICD-10-CM

## 2018-12-02 DIAGNOSIS — Z1212 Encounter for screening for malignant neoplasm of rectum: Secondary | ICD-10-CM

## 2018-12-02 DIAGNOSIS — Z136 Encounter for screening for cardiovascular disorders: Secondary | ICD-10-CM | POA: Diagnosis not present

## 2018-12-02 DIAGNOSIS — Z0001 Encounter for general adult medical examination with abnormal findings: Secondary | ICD-10-CM

## 2018-12-02 DIAGNOSIS — E1122 Type 2 diabetes mellitus with diabetic chronic kidney disease: Secondary | ICD-10-CM

## 2018-12-02 DIAGNOSIS — Z131 Encounter for screening for diabetes mellitus: Secondary | ICD-10-CM

## 2018-12-02 DIAGNOSIS — Z1211 Encounter for screening for malignant neoplasm of colon: Secondary | ICD-10-CM

## 2018-12-02 DIAGNOSIS — I10 Essential (primary) hypertension: Secondary | ICD-10-CM | POA: Diagnosis not present

## 2018-12-02 DIAGNOSIS — Z0000 Encounter for general adult medical examination without abnormal findings: Secondary | ICD-10-CM

## 2018-12-02 DIAGNOSIS — Z79899 Other long term (current) drug therapy: Secondary | ICD-10-CM

## 2018-12-02 DIAGNOSIS — Z1329 Encounter for screening for other suspected endocrine disorder: Secondary | ICD-10-CM | POA: Diagnosis not present

## 2018-12-02 DIAGNOSIS — E559 Vitamin D deficiency, unspecified: Secondary | ICD-10-CM

## 2018-12-02 DIAGNOSIS — N182 Chronic kidney disease, stage 2 (mild): Secondary | ICD-10-CM

## 2018-12-02 DIAGNOSIS — R350 Frequency of micturition: Secondary | ICD-10-CM

## 2018-12-02 DIAGNOSIS — M1000 Idiopathic gout, unspecified site: Secondary | ICD-10-CM | POA: Diagnosis not present

## 2018-12-02 DIAGNOSIS — Z13220 Encounter for screening for lipoid disorders: Secondary | ICD-10-CM

## 2018-12-02 DIAGNOSIS — E782 Mixed hyperlipidemia: Secondary | ICD-10-CM

## 2018-12-02 DIAGNOSIS — Z87891 Personal history of nicotine dependence: Secondary | ICD-10-CM

## 2018-12-02 DIAGNOSIS — R5383 Other fatigue: Secondary | ICD-10-CM

## 2018-12-02 DIAGNOSIS — Z8249 Family history of ischemic heart disease and other diseases of the circulatory system: Secondary | ICD-10-CM

## 2018-12-02 DIAGNOSIS — I700 Atherosclerosis of aorta: Secondary | ICD-10-CM

## 2018-12-02 DIAGNOSIS — E349 Endocrine disorder, unspecified: Secondary | ICD-10-CM

## 2018-12-02 MED ORDER — PHENTERMINE HCL 37.5 MG PO TABS: ORAL_TABLET | ORAL | 1 refills | Status: DC

## 2018-12-02 MED ORDER — TOPIRAMATE 50 MG PO TABS: ORAL_TABLET | ORAL | 1 refills | Status: DC

## 2018-12-03 LAB — URINALYSIS, ROUTINE W REFLEX MICROSCOPIC
BILIRUBIN URINE: NEGATIVE
Bacteria, UA: NONE SEEN /HPF
Hgb urine dipstick: NEGATIVE
Hyaline Cast: NONE SEEN /LPF
KETONES UR: NEGATIVE
LEUKOCYTES UA: NEGATIVE
NITRITE: NEGATIVE
RBC / HPF: NONE SEEN /HPF (ref 0–2)
SPECIFIC GRAVITY, URINE: 1.025 (ref 1.001–1.03)
Squamous Epithelial / LPF: NONE SEEN /HPF (ref <=5)
WBC, UA: NONE SEEN /HPF (ref 0–5)
pH: <=5 (ref 5.0–8.0)

## 2018-12-03 LAB — MICROALBUMIN / CREATININE URINE RATIO
Creatinine, Urine: 120 mg/dL (ref 20–320)
Microalb Creat Ratio: 222 mcg/mg creat — ABNORMAL HIGH (ref <30)
Microalb, Ur: 26.6 mg/dL

## 2018-12-03 LAB — LIPID PANEL
Cholesterol: 135 mg/dL (ref <200)
HDL: 34 mg/dL — ABNORMAL LOW (ref >40)
LDL Cholesterol (Calc): 71 mg/dL (calc)
Non-HDL Cholesterol (Calc): 101 mg/dL (calc) (ref <130)
Total CHOL/HDL Ratio: 4 (calc) (ref <5.0)
Triglycerides: 210 mg/dL — ABNORMAL HIGH (ref <150)

## 2018-12-03 LAB — CBC WITH DIFFERENTIAL/PLATELET
Absolute Monocytes: 690 cells/uL (ref 200–950)
Basophils Absolute: 68 cells/uL (ref 0–200)
Basophils Relative: 0.9 %
Eosinophils Absolute: 233 cells/uL (ref 15–500)
Eosinophils Relative: 3.1 %
HCT: 40.1 % (ref 38.5–50.0)
Hemoglobin: 13.9 g/dL (ref 13.2–17.1)
Lymphs Abs: 1883 cells/uL (ref 850–3900)
MCH: 31 pg (ref 27.0–33.0)
MCHC: 34.7 g/dL (ref 32.0–36.0)
MCV: 89.3 fL (ref 80.0–100.0)
MPV: 11.1 fL (ref 7.5–12.5)
Monocytes Relative: 9.2 %
Neutro Abs: 4628 cells/uL (ref 1500–7800)
Neutrophils Relative %: 61.7 %
PLATELETS: 240 10*3/uL (ref 140–400)
RBC: 4.49 10*6/uL (ref 4.20–5.80)
RDW: 12 % (ref 11.0–15.0)
Total Lymphocyte: 25.1 %
WBC: 7.5 10*3/uL (ref 3.8–10.8)

## 2018-12-03 LAB — COMPLETE METABOLIC PANEL WITH GFR
AG Ratio: 1.2 (calc) (ref 1.0–2.5)
ALT: 25 U/L (ref 9–46)
AST: 15 U/L (ref 10–35)
Albumin: 4.3 g/dL (ref 3.6–5.1)
Alkaline phosphatase (APISO): 134 U/L — ABNORMAL HIGH (ref 40–115)
BUN: 15 mg/dL (ref 7–25)
CHLORIDE: 98 mmol/L (ref 98–110)
CO2: 28 mmol/L (ref 20–32)
Calcium: 9.8 mg/dL (ref 8.6–10.3)
Creat: 0.75 mg/dL (ref 0.70–1.33)
GFR, Est African American: 121 mL/min/{1.73_m2} (ref >=60)
GFR, Est Non African American: 105 mL/min/{1.73_m2} (ref >=60)
Globulin: 3.5 g/dL (calc) (ref 1.9–3.7)
Glucose, Bld: 280 mg/dL — ABNORMAL HIGH (ref 65–99)
Potassium: 4.3 mmol/L (ref 3.5–5.3)
Sodium: 137 mmol/L (ref 135–146)
Total Bilirubin: 0.4 mg/dL (ref 0.2–1.2)
Total Protein: 7.8 g/dL (ref 6.1–8.1)

## 2018-12-03 LAB — MAGNESIUM: MAGNESIUM: 1.7 mg/dL (ref 1.5–2.5)

## 2018-12-03 LAB — IRON, TOTAL/TOTAL IRON BINDING CAP
%SAT: 24 % (calc) (ref 20–48)
Iron: 74 ug/dL (ref 50–180)
TIBC: 303 mcg/dL (calc) (ref 250–425)

## 2018-12-03 LAB — URIC ACID: Uric Acid, Serum: 6.9 mg/dL (ref 4.0–8.0)

## 2018-12-03 LAB — HEMOGLOBIN A1C
Hgb A1c MFr Bld: 10.6 % of total Hgb — ABNORMAL HIGH (ref <5.7)
Mean Plasma Glucose: 258 (calc)
eAG (mmol/L): 14.3 (calc)

## 2018-12-03 LAB — VITAMIN D 25 HYDROXY (VIT D DEFICIENCY, FRACTURES): Vit D, 25-Hydroxy: 41 ng/mL (ref 30–100)

## 2018-12-03 LAB — TESTOSTERONE: Testosterone: 301 ng/dL (ref 250–827)

## 2018-12-03 LAB — VITAMIN B12: Vitamin B-12: 1169 pg/mL — ABNORMAL HIGH (ref 200–1100)

## 2018-12-03 LAB — TSH: TSH: 1.95 mIU/L (ref 0.40–4.50)

## 2018-12-03 LAB — PSA: PSA: 0.2 ng/mL (ref <=4.0)

## 2018-12-27 ENCOUNTER — Other Ambulatory Visit: Payer: Self-pay | Admitting: Adult Health

## 2019-01-27 ENCOUNTER — Other Ambulatory Visit: Payer: Self-pay | Admitting: Internal Medicine

## 2019-01-27 DIAGNOSIS — I10 Essential (primary) hypertension: Secondary | ICD-10-CM

## 2019-01-27 MED ORDER — METFORMIN HCL ER 500 MG PO TB24: ORAL_TABLET | ORAL | 3 refills | Status: DC

## 2019-01-27 MED ORDER — ATENOLOL 50 MG PO TABS: ORAL_TABLET | ORAL | 3 refills | Status: DC

## 2019-01-29 ENCOUNTER — Other Ambulatory Visit: Payer: Self-pay | Admitting: *Deleted

## 2019-01-29 ENCOUNTER — Other Ambulatory Visit: Payer: Self-pay | Admitting: Internal Medicine

## 2019-01-29 MED ORDER — METFORMIN HCL ER 500 MG PO TB24: ORAL_TABLET | ORAL | 3 refills | Status: DC

## 2019-03-04 ENCOUNTER — Encounter: Payer: Self-pay | Admitting: Physician Assistant

## 2019-03-04 ENCOUNTER — Ambulatory Visit (INDEPENDENT_AMBULATORY_CARE_PROVIDER_SITE_OTHER): Payer: BLUE CROSS/BLUE SHIELD | Admitting: Physician Assistant

## 2019-03-04 ENCOUNTER — Other Ambulatory Visit: Payer: Self-pay

## 2019-03-04 VITALS — BP 132/80 | HR 80 | Temp 97.9°F | Ht 73.5 in | Wt 256.6 lb

## 2019-03-04 DIAGNOSIS — Z794 Long term (current) use of insulin: Secondary | ICD-10-CM | POA: Diagnosis not present

## 2019-03-04 DIAGNOSIS — E782 Mixed hyperlipidemia: Secondary | ICD-10-CM

## 2019-03-04 DIAGNOSIS — L97521 Non-pressure chronic ulcer of other part of left foot limited to breakdown of skin: Secondary | ICD-10-CM

## 2019-03-04 DIAGNOSIS — E1122 Type 2 diabetes mellitus with diabetic chronic kidney disease: Secondary | ICD-10-CM

## 2019-03-04 DIAGNOSIS — E08621 Diabetes mellitus due to underlying condition with foot ulcer: Secondary | ICD-10-CM | POA: Diagnosis not present

## 2019-03-04 DIAGNOSIS — M109 Gout, unspecified: Secondary | ICD-10-CM

## 2019-03-04 DIAGNOSIS — I10 Essential (primary) hypertension: Secondary | ICD-10-CM

## 2019-03-04 DIAGNOSIS — N182 Chronic kidney disease, stage 2 (mild): Secondary | ICD-10-CM

## 2019-03-04 DIAGNOSIS — Z79899 Other long term (current) drug therapy: Secondary | ICD-10-CM

## 2019-03-04 DIAGNOSIS — E559 Vitamin D deficiency, unspecified: Secondary | ICD-10-CM

## 2019-03-04 DIAGNOSIS — Z130 Encounter for screening for diseases of the blood and blood-forming organs and certain disorders involving the immune mechanism: Secondary | ICD-10-CM | POA: Diagnosis not present

## 2019-03-04 MED ORDER — GLUCOSE BLOOD VI STRP: ORAL_STRIP | 3 refills | Status: DC

## 2019-03-04 MED ORDER — FREESTYLE LITE DEVI: 0 refills | Status: DC

## 2019-03-04 NOTE — Patient Instructions (Addendum)
  Start Bcise injection as shown once a week. .  You may inject in the stomach, thigh or arm. You may experience nausea in the first few days which usually goes away.   You will feel fullness of the stomach with starting the medication and should try to keep the portions at meals small. If any questions or concerns are present call the office  Please check blood sugars at least half the time about 2 hours after any meal and 3 times per week on waking up. Please bring blood sugar monitor to each visit. Recommended blood sugar levels about 2 hours after meal is 140-180 and on waking up 90-130  Will get imaging on your blood vessels on your left foot  VERY important for weight loss  Get diabetic sock  Can soak in epson salts  Stay on low dose aspirin     When it comes to diets, agreement about the perfect plan isn't easy to find, even among the experts. Experts at the [employer] developed an idea known as the Healthy Eating Plate. Just imagine a plate divided into logical, healthy portions.  The emphasis is on diet quality:  Load up on vegetables and fruits - one-half of your plate: Aim for color and variety, and remember that potatoes don't count.  Go for whole grains - one-quarter of your plate: Whole wheat, barley, wheat berries, quinoa, oats, brown rice, and foods made with them. If you want pasta, go with whole wheat pasta.  Protein power - one-quarter of your plate: Fish, chicken, beans, and nuts are all healthy, versatile protein sources. Limit red meat.  The diet, however, does go beyond the plate, offering a few other suggestions.  Use healthy plant oils, such as olive, canola, soy, corn, sunflower and peanut. Check the labels, and avoid partially hydrogenated oil, which have unhealthy trans fats.  If you're thirsty, drink water. Coffee and tea are good in moderation, but skip sugary drinks and limit milk and dairy products to one or two daily  servings.  The type of carbohydrate in the diet is more important than the amount. Some sources of carbohydrates, such as vegetables, fruits, whole grains, and beans-are healthier than others.  Finally, stay active.

## 2019-03-04 NOTE — Progress Notes (Signed)
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  - patient given Bcise samples 1 month to help bring down sugars Will bring in log next visit.   Hypertension Well controlled with current medications Monitor blood pressure at home; patient to call if consistently greater than 130/80 Continue DASH diet.   Reminder to go to the ER if any CP, SOB, nausea, dizziness, severe HA, changes vision/speech, left arm numbness and tingling and jaw pain.  Cholesterol Currently at LDL goal on atorvastatin and fenofibrate; lifestyle discussed Continue low cholesterol diet and exercise.  Check lipid panel.   Diabetes with diabetic chronic kidney disease Continue medication: metformin, levemir 16 units daily, add Bcise once week ? Need to add farxiga or switch to 70/30? Reminded he needs to get annual diabetic eye exam Encouraged to check glucose  daily; at minimum needs to check fasting values, keep log and bring to next visit Continue diet and exercise.  Perform daily foot/skin check, notify office of any concerning changes.   Poorly controlled diabetes with peripheral neuropathy Foot exam completed today, has callus, decreased sensation through toes Risks and daily foot care recommendations discussed He declines podiatry referral today Perform daily foot/skin check, notify office of any concerning changes.   CKD stage 2  Increase fluids, avoid NSAIDS, monitor sugars, will monitor BMP/GFR  Obesity with co morbidities Long discussion about weight loss, diet, and exercise Recommended diet heavy in fruits and veggies and low in animal meats, cheeses, and dairy products, appropriate calorie intake Discussed ideal weight for height and initial  weight goal (240 lb) Patient on phentermine with benefit and no SE, taking drug breaks; continue close follow up.  Patient has successfully cut out sweet tea, soda; recommended he switch from sweetened creamer/honey in coffee, try to eat more regular meals Patient on phentermine with benefit and no SE, needs to take more consistently, is taking drug breaks; continue close follow up.   Will follow up in 3 months  Gout Continue allopurinol Diet discussed Check uric acid as needed  Vitamin D Def Below goal at last visit; he has increased dose increase supplementation for goal of 70-100 Check Vit D level  Continue diet and meds as discussed. Further disposition pending results of labs. Discussed med's effects and SE's.   Over 30 minutes of exam, counseling, chart review, and critical decision making was performed.   Future Appointments  Date Time Provider Belt  03/17/2019  8:45 AM Garnet Sierras, NP [facility] None  06/23/2019  9:30 AM Unk Pinto, MD [facility] None  12/21/2019  9:00 AM Unk Pinto, MD [facility] None    ----------------------------------------------------------------------------------------------------------------------  HPI 53 y.o. male  presents for 3 month follow up on hypertension, cholesterol, diabetes, gout, weight and vitamin D deficiency.   Patent is AAM with uncontrolled DM states 4 weeks ago, he started back at the gym, was walking on the treadmill and started to have pain on his left foot. Noticed an black area and blister, he switched shoes and it continued to hurt. His wife looks and noticed a black/bruised area with peeling skin  BMI is Body mass index is 33.4 kg/m., he is working on diet and exercise. He is off the phentermine and topamax  x 1 month.  Wt Readings from Last 3 Encounters:  03/04/19 256 lb 9.6 oz (116.4 kg)  12/02/18 255 lb (115.7 kg)  09/16/18 258 lb 12.8 oz (117.4 kg)   He does not currently check BPs at home,  today their BP is BP: 132/80  He does not workout. He denies chest pain, shortness of breath, dizziness.   He is on cholesterol medication (atorvastatin 10 mg daily, fenofibrate 134 mg daily) and denies myalgias. His LDL cholesterol is at goal. The cholesterol last visit was:   Lab Results  Component Value Date   CHOL 135 12/02/2018   HDL 34 (L) 12/02/2018   LDLCALC 71 12/02/2018   TRIG 210 (H) 12/02/2018   CHOLHDL 4.0 12/02/2018    He has been working on diet and exercise for T2 diabetes treated by metformin and levemir 16 units once daily, he has not checked them in a long time, he lost his meter since he moved, and denies foot ulcerations, hypoglycemia , increased appetite, nausea, paresthesia of the feet, polydipsia, polyuria, visual disturbances, vomiting and weight loss. He does not currently check sugars at home.  Last A1C in the office was:  Lab Results  Component Value Date   HGBA1C 10.6 (H) 12/02/2018   Patient is on Vitamin D supplement:    Lab Results  Component Value Date   VD25OH 41 12/02/2018     Patient is on allopurinol for gout and does not report a recent flare.  Lab Results  Component Value Date   LABURIC 6.9 12/02/2018      Current Medications:  Current Outpatient Medications on File Prior to Visit  Medication Sig  . allopurinol (ZYLOPRIM) 300 MG tablet TAKE 1 TABLET BY MOUTH EVERY DAY  . aspirin 81 MG tablet Take 81 mg by mouth daily.  Marland Kitchen atenolol (TENORMIN) 50 MG tablet Take 1 tablet Daily for BP  . benazepril (LOTENSIN) 20 MG tablet TAKE 1 TABLET AT BEDTIME FOR BLOOD PRESSURE AND KIDNEY  . Cholecalciferol (VITAMIN D) 50 MCG (2000 UT) CAPS Take 2 capsules (4,000 Units total) by mouth daily.  . insulin detemir (LEVEMIR) 100 UNIT/ML injection Increase to 15 units subcutaneously daily, then if fasting sugars still 150+ in 2 weeks increase to 18 units.  . Magnesium 500 MG TABS Take by mouth daily.  . metFORMIN (GLUCOPHAGE-XR) 500 MG 24 hr tablet Take 2  tablets 2 x/ day for Diabetes  . Multiple Vitamin (MULTIVITAMIN WITH MINERALS) TABS tablet Take 1 tablet by mouth daily.  Marland Kitchen topiramate (TOPAMAX) 50 MG tablet Take 1/2 to 1 tablet 2 x / day at Suppertime & Bedtime for Dieting & Weight Loss  . vitamin B-12 (CYANOCOBALAMIN) 100 MCG tablet Take 100 mcg by mouth daily.  Marland Kitchen atorvastatin (LIPITOR) 10 MG tablet Take 1 tablet (10 mg total) by mouth daily.  . fenofibrate micronized (LOFIBRA) 134 MG capsule Take 1 tablet daily for blood fats   No current facility-administered medications on file prior to visit.      Allergies:  Allergies  Allergen Reactions  . 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 Welchol [Colesevelam Hcl]     Chest pain     Medical History:  Past Medical History:  Diagnosis Date  . Carpal tunnel syndrome on both sides   . Diabetes mellitus type 2, insulin dependent ([place])   . Fatty liver disease, nonalcoholic   . Gout   . Hyperlipidemia   . Hypertension   . Kidney  stone   . Vitamin D deficiency    Family history- Reviewed and unchanged Social history- Reviewed and unchanged   Review of Systems:  Review of Systems  Constitutional: Negative for malaise/fatigue and weight loss.  HENT: Negative for hearing loss and tinnitus.   Eyes: Negative for blurred vision and double vision.  Respiratory: Negative for cough, shortness of breath and wheezing.   Cardiovascular: Negative for chest pain, palpitations, orthopnea, claudication and leg swelling.  Gastrointestinal: Negative for abdominal pain, blood in stool, constipation, diarrhea, heartburn, melena, nausea and vomiting.  Genitourinary: Negative.   Musculoskeletal: Negative for joint pain and myalgias.  Skin: Positive for rash.  Neurological: Negative for dizziness, tingling, sensory change, weakness and headaches.  Endo/Heme/Allergies: Negative for polydipsia.  Psychiatric/Behavioral: Negative.   All other systems reviewed and are negative.    Physical Exam: BP 132/80   Pulse 80   Temp 97.9 F (36.6 C)   Ht 6' 1.5" (1.867 m)   Wt 256 lb 9.6 oz (116.4 kg)   SpO2 98%   BMI 33.40 kg/m  Wt Readings from Last 3 Encounters:  03/04/19 256 lb 9.6 oz (116.4 kg)  12/02/18 255 lb (115.7 kg)  09/16/18 258 lb 12.8 oz (117.4 kg)   General Appearance: Well nourished, in no apparent distress. Eyes: PERRLA, EOMs, conjunctiva no swelling or erythema Sinuses: No Frontal/maxillary tenderness ENT/Mouth: Ext aud canals clear, TMs without erythema, bulging. No erythema, swelling, or exudate on post pharynx.  Tonsils not swollen or erythematous. Hearing normal.  Neck: Supple, thyroid nonpalpable. Left anterior lump, approx 2-3 cm, smooth, non-tender.  Respiratory: Respiratory effort normal, BS equal bilaterally without rales, rhonchi, wheezing or stridor.  Cardio: RRR with no MRGs. Brisk peripheral pulses without edema.  Abdomen: Soft, + BS.  Non tender, no guarding, rebound, hernias, masses. Lymphatics: Non tender without lymphadenopathy.  Musculoskeletal: Full ROM, 5/5 strength, Normal gait Skin: Warm, dry without rashes, lesions, ecchymosis. He has thick callus without breakdown to left digit medial surface. Blanchable 3 cm area of skin breakdown, without erythema, warmth, tenderness at the site with area of bruising/hematoma. Decrease hair bilateral legs with decreased DP on the left. Slightly decreased sensation.  Neuro: Cranial nerves intact. No cerebellar symptoms. Sensation decreased to bilateral feet on dorsal surfaces of toes.  Psych: Awake and oriented X 3, normal affect, Insight and Judgment appropriate.      Vicie Mutters, PA-C 12:24 PM [facility]

## 2019-03-05 LAB — COMPLETE METABOLIC PANEL WITH GFR
AG Ratio: 1.3 (calc) (ref 1.0–2.5)
ALT: 25 U/L (ref 9–46)
AST: 17 U/L (ref 10–35)
Albumin: 4.4 g/dL (ref 3.6–5.1)
Alkaline phosphatase (APISO): 114 U/L (ref 35–144)
BUN: 12 mg/dL (ref 7–25)
CO2: 31 mmol/L (ref 20–32)
Calcium: 10.1 mg/dL (ref 8.6–10.3)
Chloride: 98 mmol/L (ref 98–110)
Creat: 0.92 mg/dL (ref 0.70–1.33)
GFR, Est African American: 110 mL/min/{1.73_m2} (ref >=60)
GFR, Est Non African American: 95 mL/min/{1.73_m2} (ref >=60)
Globulin: 3.5 g/dL (calc) (ref 1.9–3.7)
Glucose, Bld: 226 mg/dL — ABNORMAL HIGH (ref 65–99)
Potassium: 4.6 mmol/L (ref 3.5–5.3)
Sodium: 138 mmol/L (ref 135–146)
Total Bilirubin: 0.4 mg/dL (ref 0.2–1.2)
Total Protein: 7.9 g/dL (ref 6.1–8.1)

## 2019-03-05 LAB — LIPID PANEL
Cholesterol: 119 mg/dL (ref <200)
HDL: 42 mg/dL (ref >=40)
LDL Cholesterol (Calc): 61 mg/dL (calc)
Non-HDL Cholesterol (Calc): 77 mg/dL (calc) (ref <130)
Total CHOL/HDL Ratio: 2.8 (calc) (ref <5.0)
Triglycerides: 84 mg/dL (ref <150)

## 2019-03-05 LAB — CBC WITH DIFFERENTIAL/PLATELET
Absolute Monocytes: 571 cells/uL (ref 200–950)
Basophils Absolute: 88 cells/uL (ref 0–200)
Basophils Relative: 1.3 %
Eosinophils Absolute: 143 cells/uL (ref 15–500)
Eosinophils Relative: 2.1 %
HCT: 41.2 % (ref 38.5–50.0)
Hemoglobin: 14 g/dL (ref 13.2–17.1)
Lymphs Abs: 2645 cells/uL (ref 850–3900)
MCH: 29.9 pg (ref 27.0–33.0)
MCHC: 34 g/dL (ref 32.0–36.0)
MCV: 88 fL (ref 80.0–100.0)
MPV: 11.3 fL (ref 7.5–12.5)
Monocytes Relative: 8.4 %
Neutro Abs: 3352 cells/uL (ref 1500–7800)
Neutrophils Relative %: 49.3 %
Platelets: 258 10*3/uL (ref 140–400)
RBC: 4.68 10*6/uL (ref 4.20–5.80)
RDW: 12.4 % (ref 11.0–15.0)
Total Lymphocyte: 38.9 %
WBC: 6.8 10*3/uL (ref 3.8–10.8)

## 2019-03-05 LAB — URIC ACID: Uric Acid, Serum: 5.9 mg/dL (ref 4.0–8.0)

## 2019-03-05 LAB — VITAMIN D 25 HYDROXY (VIT D DEFICIENCY, FRACTURES): Vit D, 25-Hydroxy: 33 ng/mL (ref 30–100)

## 2019-03-05 LAB — IRON,?TOTAL/TOTAL IRON BINDING CAP: %SAT: 21 % (calc) (ref 20–48)

## 2019-03-05 LAB — MAGNESIUM: Magnesium: 1.8 mg/dL (ref 1.5–2.5)

## 2019-03-05 LAB — IRON, TOTAL/TOTAL IRON BINDING CAP
Iron: 69 ug/dL (ref 50–180)
TIBC: 328 mcg/dL (calc) (ref 250–425)

## 2019-03-05 LAB — TSH: TSH: 2.7 mIU/L (ref 0.40–4.50)

## 2019-03-05 LAB — HEMOGLOBIN A1C
Hgb A1c MFr Bld: 12 % of total Hgb — ABNORMAL HIGH (ref <5.7)
Mean Plasma Glucose: 298 (calc)
eAG (mmol/L): 16.5 (calc)

## 2019-03-16 DIAGNOSIS — E08621 Diabetes mellitus due to underlying condition with foot ulcer: Secondary | ICD-10-CM | POA: Insufficient documentation

## 2019-03-16 DIAGNOSIS — L97521 Non-pressure chronic ulcer of other part of left foot limited to breakdown of skin: Secondary | ICD-10-CM

## 2019-03-16 HISTORY — DX: Non-pressure chronic ulcer of other part of left foot limited to breakdown of skin: E08.621

## 2019-03-16 HISTORY — DX: Non-pressure chronic ulcer of other part of left foot limited to breakdown of skin: L97.521

## 2019-03-16 NOTE — Progress Notes (Signed)
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  Encouraged healthy dietary changes.  Will check A1c next office visit.  Poorly controlled type 2 diabetes mellitus with peripheral neuropathy ([place]) Foot exam completed today, has callus left, decreased sensation through toes Risks and daily foot care recommendations discussed No change in sensation from previous visit Perform daily foot/skin check, notify office of any concerning changes.   CKD stage 2 due to type 2 diabetes mellitus ([place]) Continue medication: metformin, levemir 18 units daily, add Bcise once week Discussed farxiga with patient Improvements in fastings Continue to work on diet and exercise. Reminded he needs to get annual diabetic eye exam Encouraged to check glucose twice a day Perform daily foot/skin check, notify office of any concerning changes.    Obesity (BMI 30.0-34.9) Discussed dietary and exercise modifications   Type 2 DM with CKD stage 2 and hypertension (HCC) Increase fluids, avoid NSAIDS, monitor sugars, will monitor Will check labs next office visit.  Hypertension Continue medication: Monitor blood pressure at home; call if consistently over 130/80 Continue DASH diet.   Reminder to go to the ER if any CP, SOB, nausea, dizziness, severe HA, changes vision/speech, left arm numbness and tingling and jaw pain.  Further disposition pending results of labs. Discussed med's effects and SE's.   Over 54 minutes of exam, counseling, chart review, and critical decision  making was performed.   Future Appointments  Date Time Provider [place]  06/23/2019  9:30 AM Unk Pinto, MD [facility] None  12/21/2019  9:00 AM Unk Pinto, MD [facility] None    ------------------------------------------------------------------------------------------------------------------   HPI 53 y.o.male presents for evaluation of left foot.  Two weeks ago he was start on Bcise weekly injectable.  He reports so far the injections are going well  He denies any complications using the devise or injection site reactions.  He is rotating sites.  Reports he has been working on improving his diet.  He has cut all sodas out of his diet.  He reports he will have one soda on the weekend.   Reports the wound to his left foot has improved.  He reports he has been vigilantly following instructions and sending pictures via MyChart.  It is on his left plantar surface.  He has been soaking in epsom salt and elevating his foot with positive results.  He has decreased sensation to bilateral lower extremities.  He is checking his glucose before breakfast and around 2:30-2pm before lunch.  Reports his fasting glucose has gone from 214-167.  Reports he is feeling well and no adverse side effects.  Reports his afternoon glucose is in the 170's.    Past Medical History:  Diagnosis Date  . Carpal tunnel syndrome on both sides   . Diabetes mellitus type 2, insulin dependent ([place])   . Fatty liver disease, nonalcoholic   . Gout   . Hyperlipidemia   . Hypertension   . Kidney stone   . Vitamin D deficiency      Allergies  Allergen Reactions  . Ppd [Tuberculin Purified Protein Derivative]     +  PPD 2010  . Welchol [Colesevelam Hcl]     Chest pain    Current Outpatient Medications on File Prior to Visit  Medication Sig  . allopurinol (ZYLOPRIM) 300 MG tablet TAKE 1 TABLET BY MOUTH EVERY DAY  . aspirin 81 MG tablet Take 81 mg by mouth daily.  Marland Kitchen atenolol (TENORMIN) 50 MG tablet Take 1  tablet Daily for BP  . atorvastatin (LIPITOR) 10 MG tablet Take 1 tablet (10 mg total) by mouth daily.  . benazepril (LOTENSIN) 20 MG tablet TAKE 1 TABLET AT BEDTIME FOR BLOOD PRESSURE AND KIDNEY  . Blood Glucose Monitoring Suppl (FREESTYLE LITE) DEVI 1 device to check sugars, patient on insulin  . Cholecalciferol (VITAMIN D) 50 MCG (2000 UT) CAPS Take 2 capsules (4,000 Units total) by mouth daily.  . fenofibrate micronized (LOFIBRA) 134 MG capsule Take 1 tablet daily for blood fats  . insulin detemir (LEVEMIR) 100 UNIT/ML injection Increase to 15 units subcutaneously daily, then if fasting sugars still 150+ in 2 weeks increase to 18 units.  . Magnesium 500 MG TABS Take by mouth daily.  . metFORMIN (GLUCOPHAGE-XR) 500 MG 24 hr tablet Take 2 tablets 2 x/ day for Diabetes  . Multiple Vitamin (MULTIVITAMIN WITH MINERALS) TABS tablet Take 1 tablet by mouth daily.  Marland Kitchen topiramate (TOPAMAX) 50 MG tablet Take 1/2 to 1 tablet 2 x / day at Suppertime & Bedtime for Dieting & Weight Loss  . vitamin B-12 (CYANOCOBALAMIN) 100 MCG tablet Take 100 mcg by mouth daily.  Marland Kitchen glucose blood (FREESTYLE LITE) test strip Test sugar 3 x daily due to hyperglycemia and insulin use. E11.22   No current facility-administered medications on file prior to visit.     ROS: all negative except above.   Physical Exam:  BP 132/84   Pulse 81   Temp (!) 97.5 F (36.4 C)   Ht 6' 1.5" (1.867 m)   Wt 257 lb 3.2 oz (116.7 kg)   SpO2 99%   BMI 33.47 kg/m   General Appearance: Well nourished, in no apparent distress. Eyes: PERRLA, EOMs, conjunctiva no swelling or erythema Sinuses: No Frontal/maxillary tenderness ENT/Mouth: Ext aud canals clear, TMs without erythema, bulging. No erythema, swelling, or exudate on post pharynx.  Tonsils not swollen or erythematous. Hearing normal.  Neck: Supple, thyroid normal.  Respiratory: Respiratory effort normal, BS equal bilaterally without rales, rhonchi, wheezing or stridor.  Cardio: RRR  with no MRGs. Brisk peripheral pulses without edema.  Abdomen: Soft, + BS.  Non tender, no guarding, rebound, hernias, masses. Lymphatics: Non tender without lymphadenopathy.  Musculoskeletal: Full ROM, 5/5 strength, normal gait.  Skin: Warm, dry without rashes, lesions, ecchymosis.  Neuro: Cranial nerves intact. Normal muscle tone, no cerebellar symptoms. Sensation decreased to bilateral lower extremities, all other intact.  Psych: Awake and oriented X 3, normal affect, Insight and Judgment appropriate.     Garnet Sierras, NP 8:50 AM [facility]

## 2019-03-17 ENCOUNTER — Encounter: Payer: Self-pay | Admitting: Adult Health Nurse Practitioner

## 2019-03-17 ENCOUNTER — Ambulatory Visit (INDEPENDENT_AMBULATORY_CARE_PROVIDER_SITE_OTHER): Payer: PRIVATE HEALTH INSURANCE | Admitting: Adult Health Nurse Practitioner

## 2019-03-17 ENCOUNTER — Other Ambulatory Visit: Payer: Self-pay

## 2019-03-17 ENCOUNTER — Ambulatory Visit: Payer: Self-pay | Admitting: Adult Health

## 2019-03-17 VITALS — BP 132/84 | HR 81 | Temp 97.5°F | Ht 73.5 in | Wt 257.2 lb

## 2019-03-17 DIAGNOSIS — E1122 Type 2 diabetes mellitus with diabetic chronic kidney disease: Secondary | ICD-10-CM

## 2019-03-17 DIAGNOSIS — E66811 Obesity, class 1: Secondary | ICD-10-CM

## 2019-03-17 DIAGNOSIS — N182 Chronic kidney disease, stage 2 (mild): Secondary | ICD-10-CM

## 2019-03-17 DIAGNOSIS — E08621 Diabetes mellitus due to underlying condition with foot ulcer: Secondary | ICD-10-CM | POA: Diagnosis not present

## 2019-03-17 DIAGNOSIS — E1142 Type 2 diabetes mellitus with diabetic polyneuropathy: Secondary | ICD-10-CM | POA: Diagnosis not present

## 2019-03-17 DIAGNOSIS — E1165 Type 2 diabetes mellitus with hyperglycemia: Secondary | ICD-10-CM

## 2019-03-17 DIAGNOSIS — M1000 Idiopathic gout, unspecified site: Secondary | ICD-10-CM | POA: Diagnosis not present

## 2019-03-17 DIAGNOSIS — I129 Hypertensive chronic kidney disease with stage 1 through stage 4 chronic kidney disease, or unspecified chronic kidney disease: Secondary | ICD-10-CM

## 2019-03-17 DIAGNOSIS — E669 Obesity, unspecified: Secondary | ICD-10-CM | POA: Diagnosis not present

## 2019-03-17 DIAGNOSIS — L97521 Non-pressure chronic ulcer of other part of left foot limited to breakdown of skin: Secondary | ICD-10-CM

## 2019-03-17 NOTE — Patient Instructions (Addendum)
Continue weekly Bcise injection and rotate sites as you have been doing.  Continue to work on diet.  Good work cutting the sodas.  Continue working on this.  Think about replacing french fries with a healthier option, carrots, peanut butter and celery for example.  We will contact you regarding appointment for ABI. Continue soaking food in epsom salt.  Keep feet moisturized and check them daily.  Please contact the office if you do not hear from [place] in two weeks.  We will check you A1c in August at your next appointment.  Please contact office with any concerns or questions.    Please check blood sugars at least half the time about 2 hours after any meal and 3 times per week on waking up. Please bring blood sugar monitor to each visit. Recommended blood sugar levels about 2 hours after meal is 140-180 and on waking up 90-130  Will get imaging on your blood vessels on your left foot  VERY important for weight loss  Get diabetic sock  Can soak in epson salts  Stay on low dose aspirin     When it comes to diets, agreement about the perfect plan isn't easy to find, even among the experts. Experts at the [employer] developed an idea known as the Healthy Eating Plate. Just imagine a plate divided into logical, healthy portions.  The emphasis is on diet quality:  Load up on vegetables and fruits - one-half of your plate: Aim for color and variety, and remember that potatoes don't count.  Go for whole grains - one-quarter of your plate: Whole wheat, barley, wheat berries, quinoa, oats, brown rice, and foods made with them. If you want pasta, go with whole wheat pasta.  Protein power - one-quarter of your plate: Fish, chicken, beans, and nuts are all healthy, versatile protein sources. Limit red meat.  The diet, however, does go beyond the plate, offering a few other suggestions.  Use healthy plant oils, such as olive, canola, soy, corn, sunflower  and peanut. Check the labels, and avoid partially hydrogenated oil, which have unhealthy trans fats.  If you're thirsty, drink water. Coffee and tea are good in moderation, but skip sugary drinks and limit milk and dairy products to one or two daily servings.  The type of carbohydrate in the diet is more important than the amount. Some sources of carbohydrates, such as vegetables, fruits, whole grains, and beans-are healthier than others.  Finally, stay active.

## 2019-03-27 ENCOUNTER — Ambulatory Visit (HOSPITAL_COMMUNITY)
Admission: RE | Admit: 2019-03-27 | Discharge: 2019-03-27 | Disposition: A | Discharge status: Home / Self Care | Payer: PRIVATE HEALTH INSURANCE | Source: Ambulatory Visit | Attending: Physician Assistant | Admitting: Physician Assistant

## 2019-03-27 ENCOUNTER — Other Ambulatory Visit: Payer: Self-pay

## 2019-03-27 DIAGNOSIS — L97521 Non-pressure chronic ulcer of other part of left foot limited to breakdown of skin: Secondary | ICD-10-CM | POA: Diagnosis not present

## 2019-03-27 DIAGNOSIS — E08621 Diabetes mellitus due to underlying condition with foot ulcer: Secondary | ICD-10-CM

## 2019-03-30 ENCOUNTER — Other Ambulatory Visit: Payer: Self-pay | Admitting: Physician Assistant

## 2019-03-30 MED ORDER — EXENATIDE ER 2 MG/0.85ML ~~LOC~~ AUIJ: 2.0000 mg | AUTO-INJECTOR | SUBCUTANEOUS | 5 refills | Status: DC

## 2019-04-21 ENCOUNTER — Other Ambulatory Visit: Payer: Self-pay | Admitting: Adult Health

## 2019-04-21 MED ORDER — DULAGLUTIDE 1.5 MG/0.5ML ~~LOC~~ SOAJ: 1.5000 mg | SUBCUTANEOUS | 5 refills | Status: DC

## 2019-06-20 ENCOUNTER — Other Ambulatory Visit: Payer: Self-pay | Admitting: Internal Medicine

## 2019-06-22 ENCOUNTER — Encounter: Payer: Self-pay | Admitting: Internal Medicine

## 2019-06-22 NOTE — Progress Notes (Signed)
History of Present Illness:      This very nice 53 y.o. MBM presents for 6 month follow up with HTN, HLD, Insulin req T2_DM and Vitamin D Deficiency. Patient has Gout controlled on his Meds.       Patient is treated for HTN (2007) & BP has been controlled at home. Today's BP is at goal - 120/82. Patient has had no complaints of any cardiac type chest pain, palpitations, dyspnea / orthopnea / PND, dizziness, claudication, or dependent edema.      Hyperlipidemia is controlled with diet & meds. Patient denies myalgias or other med SE's. Last Lipids were at goal: Lab Results  Component Value Date   CHOL 119 03/04/2019   HDL 42 03/04/2019   LDLCALC 61 03/04/2019   TRIG 84 03/04/2019   CHOLHDL 2.8 03/04/2019       Also, the patient has Moderate Obesity (BMI 32+)  and history of  Insulin Requiring T2_DM (2007) and has had no symptoms of reactive hypoglycemia, diabetic polys, paresthesias or visual blurring.  Last A1c was not at goal: Lab Results  Component Value Date   HGBA1C 12.0 (H) 03/04/2019       Further, the patient also has history of Vitamin D Deficiency ("23" / 2008) and supplements vitamin D without any suspected side-effects. Last vitamin D was still very low (goal 70-100): Lab Results  Component Value Date   VD25OH 33 03/04/2019   Current Outpatient Medications on File Prior to Visit  Medication Sig  . allopurinol (ZYLOPRIM) 300 MG tablet TAKE 1 TABLET BY MOUTH EVERY DAY  . aspirin 81 MG tablet Take 81 mg by mouth daily.  Marland Kitchen atenolol (TENORMIN) 50 MG tablet Take 1 tablet Daily for BP  . benazepril (LOTENSIN) 20 MG tablet TAKE 1 TABLET AT BEDTIME FOR BLOOD PRESSURE AND KIDNEY  . Blood Glucose Monitoring Suppl (FREESTYLE LITE) DEVI 1 device to check sugars, patient on insulin  . Cholecalciferol (VITAMIN D) 50 MCG (2000 UT) CAPS Take 2 capsules (4,000 Units total) by mouth daily.  Marland Kitchen glucose blood (FREESTYLE LITE) test strip Test sugar 3 x daily due to hyperglycemia and  insulin use. E11.22  . insulin detemir (LEVEMIR) 100 UNIT/ML injection Increase to 15 units subcutaneously daily, then if fasting sugars still 150+ in 2 weeks increase to 18 units.  . Magnesium 500 MG TABS Take by mouth daily.  . metFORMIN (GLUCOPHAGE-XR) 500 MG 24 hr tablet Take 2 tablets 2 x/ day for Diabetes  . Multiple Vitamin (MULTIVITAMIN WITH MINERALS) TABS tablet Take 1 tablet by mouth daily.  . phentermine (ADIPEX-P) 37.5 MG tablet Take 1 tablet every morning for Dieting & Weight Loss  . topiramate (TOPAMAX) 50 MG tablet Take 1/2 to 1 tablet 2 x / day at Suppertime & Bedtime for Dieting & Weight Loss  . vitamin B-12 (CYANOCOBALAMIN) 100 MCG tablet Take 100 mcg by mouth daily.  Marland Kitchen atorvastatin (LIPITOR) 10 MG tablet Take 1 tablet (10 mg total) by mouth daily.  . Dulaglutide (TRULICITY) 1.5 WS/5.6CL SOPN Inject 1.5 mg into the skin once a week. (Patient not taking: Reported on 06/23/2019)  . fenofibrate micronized (LOFIBRA) 134 MG capsule Take 1 tablet daily for blood fats   No current facility-administered medications on file prior to visit.    Allergies  Allergen Reactions  . 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 Welchol [Colesevelam Hcl]     Chest pain   PMHx:  Past Medical History:  Diagnosis Date  . Carpal tunnel syndrome on both sides   . Diabetes mellitus type 2, insulin dependent ([place])   . Fatty liver disease, nonalcoholic   . Gout   . Hyperlipidemia   . Hypertension   . Kidney stone   . Vitamin D deficiency    Immunization History  Administered Date(s) Administered  . Influenza Inj Mdck Quad With Preservative 09/17/2017, 09/16/2018  . Influenza Split 09/05/2015  . Influenza,inj,quad, With Preservative 10/22/2016  . Pneumococcal Polysaccharide-23 09/05/2015  . Pneumococcal-Unspecified 11/22/2009  . Td 09/17/2017  . Tdap 11/22/2006   Past Surgical History:  Procedure Laterality Date  . CYSTOSCOPY WITH RETROGRADE PYELOGRAM,  URETEROSCOPY AND STENT PLACEMENT Right 02/01/2015   Procedure: CYSTOSCOPY WITH RETROGRADE PYELOGRAM, URETEROSCOPY, DIGITAL URETEROSCOPY with [place];  Surgeon: Alexis Frock, MD;  Location: [facility];  Service: Urology;  Laterality: Right;  . HOLMIUM LASER APPLICATION Right 03/13/7740   Procedure: HOLMIUM LASER APPLICATION;  Surgeon: Alexis Frock, MD;  Location: [facility];  Service: Urology;  Laterality: Right;   FHx:    Reviewed / unchanged  SHx:    Reviewed / unchanged   Systems Review:  Constitutional: Denies fever, chills, wt changes, headaches, insomnia, fatigue, night sweats, change in appetite. Eyes: Denies redness, blurred vision, diplopia, discharge, itchy, watery eyes.  ENT: Denies discharge, congestion, post nasal drip, epistaxis, sore throat, earache, hearing loss, dental pain, tinnitus, vertigo, sinus pain, snoring.  CV: Denies chest pain, palpitations, irregular heartbeat, syncope, dyspnea, diaphoresis, orthopnea, PND, claudication or edema. Respiratory: denies cough, dyspnea, DOE, pleurisy, hoarseness, laryngitis, wheezing.  Gastrointestinal: Denies dysphagia, odynophagia, heartburn, reflux, water brash, abdominal pain or cramps, nausea, vomiting, bloating, diarrhea, constipation, hematemesis, melena, hematochezia  or hemorrhoids. Genitourinary: Denies dysuria, frequency, urgency, nocturia, hesitancy, discharge, hematuria or flank pain. Musculoskeletal: Denies arthralgias, myalgias, stiffness, jt. swelling, pain, limping or strain/sprain.  Skin: Denies pruritus, rash, hives, warts, acne, eczema or change in skin lesion(s). Neuro: No weakness, tremor, incoordination, spasms, paresthesia or pain. Psychiatric: Denies confusion, memory loss or sensory loss. Endo: Denies change in weight, skin or hair change.  Heme/Lymph: No excessive bleeding, bruising or enlarged lymph nodes.  Physical Exam  BP 120/82   Pulse 64    Temp (!) 97 F (36.1 C)   Resp 16   Ht 6' 1.5" (1.867 m)   Wt 256 lb 3.2 oz (116.2 kg)   BMI 33.34 kg/m   Appears  well nourished, well groomed  and in no distress.  Eyes: PERRLA, EOMs, conjunctiva no swelling or erythema. Sinuses: No frontal/maxillary tenderness ENT/Mouth: EAC's clear, TM's nl w/o erythema, bulging. Nares clear w/o erythema, swelling, exudates. Oropharynx clear without erythema or exudates. Oral hygiene is good. Tongue normal, non obstructing. Hearing intact.  Neck: Supple. Thyroid not palpable. Car 2+/2+ without bruits, nodes or JVD. Chest: Respirations nl with BS clear & equal w/o rales, rhonchi, wheezing or stridor.  Cor: Heart sounds normal w/ regular rate and rhythm without sig. murmurs, gallops, clicks or rubs. Peripheral pulses normal and equal  without edema.  Abdomen: Soft & bowel sounds normal. Non-tender w/o guarding, rebound, hernias, masses or organomegaly.  Lymphatics: Unremarkable.  Musculoskeletal: Full ROM all peripheral extremities, joint stability, 5/5 strength and normal gait.  Skin: Warm, dry without exposed rashes, lesions or ecchymosis apparent.  Neuro: Cranial nerves intact, reflexes equal bilaterally. Sensory-motor testing grossly intact. Tendon reflexes grossly intact.  Pysch: Alert & oriented x 3.  Insight and judgement nl &  appropriate. No ideations.  Assessment and Plan:  1. Essential hypertension  - Continue medication, monitor blood pressure at home.  - Continue DASH diet.  Reminder to go to the ER if any CP,  SOB, nausea, dizziness, severe HA, changes vision/speech.  - CBC with Differential/Platelet - COMPLETE METABOLIC PANEL WITH GFR - Magnesium - TSH  2. Hyperlipidemia, mixed  - Continue diet/meds, exercise,& lifestyle modifications.  - Continue monitor periodic cholesterol/liver & renal functions   - Lipid panel - TSH  3. Insulin-requiring or dependent type II diabetes mellitus ([place])  - Continue diet, exercise  -  Lifestyle modifications.  - Monitor appropriate labs.  - Hemoglobin A1c  4. Vitamin D deficiency  - Continue supplementation.  - VITAMIN D 25 Hydroxyl  5. Idiopathic gout  - Uric acid  6. Medication management  - CBC with Differential/Platelet - COMPLETE METABOLIC PANEL WITH GFR - Magnesium - Lipid panel - TSH - Hemoglobin A1c - VITAMIN D 25 Hydroxyl - Uric acid      Discussed  regular exercise, BP monitoring, weight control to achieve/maintain BMI less than 25 and discussed med and SE's. Recommended labs to assess and monitor clinical status with further disposition pending results of labs.  I discussed the assessment and treatment plan with the patient. The patient was provided an opportunity to ask questions and all were answered. The patient agreed with the plan and demonstrated an understanding of the instructions. I  provided over 30 minutes of exam, counseling, chart review and  complex critical decision making.   Kirtland Bouchard, MD

## 2019-06-22 NOTE — Patient Instructions (Signed)

## 2019-06-23 ENCOUNTER — Ambulatory Visit (INDEPENDENT_AMBULATORY_CARE_PROVIDER_SITE_OTHER): Payer: PRIVATE HEALTH INSURANCE | Admitting: Internal Medicine

## 2019-06-23 ENCOUNTER — Other Ambulatory Visit: Payer: Self-pay

## 2019-06-23 VITALS — BP 120/82 | HR 64 | Temp 97.0°F | Resp 16 | Ht 73.5 in | Wt 256.2 lb

## 2019-06-23 DIAGNOSIS — I10 Essential (primary) hypertension: Secondary | ICD-10-CM | POA: Diagnosis not present

## 2019-06-23 DIAGNOSIS — Z79899 Other long term (current) drug therapy: Secondary | ICD-10-CM | POA: Diagnosis not present

## 2019-06-23 DIAGNOSIS — E119 Type 2 diabetes mellitus without complications: Secondary | ICD-10-CM | POA: Diagnosis not present

## 2019-06-23 DIAGNOSIS — E559 Vitamin D deficiency, unspecified: Secondary | ICD-10-CM | POA: Diagnosis not present

## 2019-06-23 DIAGNOSIS — M1000 Idiopathic gout, unspecified site: Secondary | ICD-10-CM

## 2019-06-23 DIAGNOSIS — E782 Mixed hyperlipidemia: Secondary | ICD-10-CM | POA: Diagnosis not present

## 2019-06-23 DIAGNOSIS — Z794 Long term (current) use of insulin: Secondary | ICD-10-CM

## 2019-06-24 LAB — CBC WITH DIFFERENTIAL/PLATELET
Absolute Monocytes: 592 cells/uL (ref 200–950)
Basophils Absolute: 79 cells/uL (ref 0–200)
Basophils Relative: 1.3 %
Eosinophils Absolute: 281 cells/uL (ref 15–500)
Eosinophils Relative: 4.6 %
HCT: 40.2 % (ref 38.5–50.0)
Hemoglobin: 13.3 g/dL (ref 13.2–17.1)
Lymphs Abs: 2452 cells/uL (ref 850–3900)
MCH: 30.2 pg (ref 27.0–33.0)
MCHC: 33.1 g/dL (ref 32.0–36.0)
MCV: 91.2 fL (ref 80.0–100.0)
MPV: 10.7 fL (ref 7.5–12.5)
Monocytes Relative: 9.7 %
Neutro Abs: 2696 cells/uL (ref 1500–7800)
Neutrophils Relative %: 44.2 %
Platelets: 222 10*3/uL (ref 140–400)
RBC: 4.41 10*6/uL (ref 4.20–5.80)
RDW: 12.3 % (ref 11.0–15.0)
Total Lymphocyte: 40.2 %
WBC: 6.1 10*3/uL (ref 3.8–10.8)

## 2019-06-24 LAB — COMPLETE METABOLIC PANEL WITH GFR
AG Ratio: 1.4 (calc) (ref 1.0–2.5)
ALT: 23 U/L (ref 9–46)
AST: 17 U/L (ref 10–35)
Albumin: 4.4 g/dL (ref 3.6–5.1)
Alkaline phosphatase (APISO): 109 U/L (ref 35–144)
BUN: 22 mg/dL (ref 7–25)
CO2: 25 mmol/L (ref 20–32)
Calcium: 9.8 mg/dL (ref 8.6–10.3)
Chloride: 98 mmol/L (ref 98–110)
Creat: 0.97 mg/dL (ref 0.70–1.33)
GFR, Est African American: 103 mL/min/{1.73_m2} (ref >=60)
GFR, Est Non African American: 89 mL/min/{1.73_m2} (ref >=60)
Globulin: 3.2 g/dL (calc) (ref 1.9–3.7)
Glucose, Bld: 323 mg/dL — ABNORMAL HIGH (ref 65–99)
Potassium: 4.6 mmol/L (ref 3.5–5.3)
Sodium: 133 mmol/L — ABNORMAL LOW (ref 135–146)
Total Bilirubin: 0.4 mg/dL (ref 0.2–1.2)
Total Protein: 7.6 g/dL (ref 6.1–8.1)

## 2019-06-24 LAB — LIPID PANEL
Cholesterol: 141 mg/dL (ref <200)
HDL: 31 mg/dL — ABNORMAL LOW (ref >=40)
Non-HDL Cholesterol (Calc): 110 mg/dL (calc) (ref <130)
Total CHOL/HDL Ratio: 4.5 (calc) (ref <5.0)
Triglycerides: 475 mg/dL — ABNORMAL HIGH (ref <150)

## 2019-06-24 LAB — HEMOGLOBIN A1C
Hgb A1c MFr Bld: 12.2 % of total Hgb — ABNORMAL HIGH (ref <5.7)
Mean Plasma Glucose: 303 (calc)
eAG (mmol/L): 16.8 (calc)

## 2019-06-24 LAB — VITAMIN D 25 HYDROXY (VIT D DEFICIENCY, FRACTURES): Vit D, 25-Hydroxy: 20 ng/mL — ABNORMAL LOW (ref 30–100)

## 2019-06-24 LAB — URIC ACID: Uric Acid, Serum: 6.5 mg/dL (ref 4.0–8.0)

## 2019-06-24 LAB — TSH: TSH: 2.5 mIU/L (ref 0.40–4.50)

## 2019-06-24 LAB — MAGNESIUM: Magnesium: 1.9 mg/dL (ref 1.5–2.5)

## 2019-06-27 ENCOUNTER — Encounter: Payer: Self-pay | Admitting: Internal Medicine

## 2019-08-17 ENCOUNTER — Other Ambulatory Visit: Payer: Self-pay | Admitting: Internal Medicine

## 2019-08-17 ENCOUNTER — Other Ambulatory Visit: Payer: Self-pay | Admitting: *Deleted

## 2019-08-17 ENCOUNTER — Ambulatory Visit
Admission: RE | Admit: 2019-08-17 | Discharge: 2019-08-17 | Disposition: A | Discharge status: Home / Self Care | Payer: PRIVATE HEALTH INSURANCE | Source: Ambulatory Visit | Attending: Internal Medicine | Admitting: Internal Medicine

## 2019-08-17 ENCOUNTER — Telehealth: Payer: Self-pay | Admitting: *Deleted

## 2019-08-17 DIAGNOSIS — R05 Cough: Secondary | ICD-10-CM

## 2019-08-17 DIAGNOSIS — R059 Cough, unspecified: Secondary | ICD-10-CM

## 2019-08-17 NOTE — Telephone Encounter (Signed)
Patient was advised he needs a  Chest x-ray and an OV to evaluate his cough.  The patient has an appointment with Garnet Sierras on 08/18/2019 to be evaluated.

## 2019-08-18 ENCOUNTER — Other Ambulatory Visit: Payer: Self-pay

## 2019-08-18 ENCOUNTER — Ambulatory Visit (INDEPENDENT_AMBULATORY_CARE_PROVIDER_SITE_OTHER): Payer: PRIVATE HEALTH INSURANCE | Admitting: Adult Health Nurse Practitioner

## 2019-08-18 ENCOUNTER — Encounter: Payer: Self-pay | Admitting: Adult Health Nurse Practitioner

## 2019-08-18 VITALS — BP 142/76 | HR 71 | Temp 97.9°F | Wt 262.0 lb

## 2019-08-18 DIAGNOSIS — R0989 Other specified symptoms and signs involving the circulatory and respiratory systems: Secondary | ICD-10-CM

## 2019-08-18 DIAGNOSIS — I129 Hypertensive chronic kidney disease with stage 1 through stage 4 chronic kidney disease, or unspecified chronic kidney disease: Secondary | ICD-10-CM

## 2019-08-18 DIAGNOSIS — J988 Other specified respiratory disorders: Secondary | ICD-10-CM | POA: Diagnosis not present

## 2019-08-18 DIAGNOSIS — E1122 Type 2 diabetes mellitus with diabetic chronic kidney disease: Secondary | ICD-10-CM

## 2019-08-18 DIAGNOSIS — N182 Chronic kidney disease, stage 2 (mild): Secondary | ICD-10-CM

## 2019-08-18 DIAGNOSIS — Z712 Person consulting for explanation of examination or test findings: Secondary | ICD-10-CM

## 2019-08-18 DIAGNOSIS — R05 Cough: Secondary | ICD-10-CM | POA: Diagnosis not present

## 2019-08-18 DIAGNOSIS — R059 Cough, unspecified: Secondary | ICD-10-CM

## 2019-08-18 DIAGNOSIS — B9789 Other viral agents as the cause of diseases classified elsewhere: Secondary | ICD-10-CM

## 2019-08-18 NOTE — Patient Instructions (Addendum)
  Today you are being treated for:   Viral Respiratory Infection     Treat the symptoms!  Please take these medications:       Cough:  Mucinex Take one tablet by mouth every 12 hours with plenty of water while you have symptoms. This is an expectorant and will help to clear the congestion in your lungs. You may take the Mucinex DM first thing in the morning  If you take the Mucinex DM check your blood pressure.  If it is 160/90 or higher please STOP taking this medication.   Chest Congestion:  Cool-mist humidifier / vaporizer Or warm steamy shower  Menthol Chest rubs You can get at any pharmacy Use as directed on packaging  Increase your Levemir 29units  Monitor your glucose.  It will be higher when you are not feeling well.

## 2019-08-18 NOTE — Progress Notes (Signed)
Assessment and Plan:  Nazario was seen today for cough and chest congestion.  Discussed likely viral in nature related to clear chest x-ray.  Discussed treating symptoms and monitoring.  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 Taking metformin 500mg  two tablets BID Increase levemir from 24 units in am to 29units Discussed S&S of HYPO & HYPERglycemia Increase water intake Discussed dietary and exercise modifications    Discussed med's effects and SE's.   Over 30 minutes of interview exam, counseling, chart review, and critical decision making was performed.   Future Appointments  Date Time Provider [place]  09/29/2019  8:45 AM Garnet Sierras, NP [facility] None  01/12/2020  9:00 AM Unk Pinto, MD [facility] None    ------------------------------------------------------------------------------------------------------------------   HPI 53 y.o.male presents in office for evaluation of cough that has been persistent for two weeks.  Writer wearing face shield and mask, patient wearing mask during appointment.  He reports that it is a nagging cough that is intermittently productive.  Reports that once he gets moving around then the coughing increases.  At night he reports increase in his cough when he lays down then it subsided.  This does not wake up up during the night.  Denies and nasal congestions .  He reports that he feels tight in his chest.  When the cough is productive is produces a yellowish green mucus.  He had  a chest x-ray, one day ago, negative for any acute pulmonary or cardio involvement. He denies any headaches, rhinitis, otalgia, pharyngitis, shortness of breath, wheezing, orthopnea, chest pains, palpitations, nausea, vomiting, diarrhea or peripheral edema.    A month and a half ago he reports he had a episode of reflux that lasted about three days and then resolved.  He does not take OTC or prescriptive products for reflux.  He reports he monitors what he eats and tries to avoid triggers.  Reports his glucose readings have elevated in the 200's-340's. since he has had this cough.  He is taking 24 units of Levemir Qam.  Denies any hypoglycemia.  Past Medical History:  Diagnosis Date  . Carpal tunnel syndrome on both sides   . Diabetes mellitus type 2, insulin dependent ([place])   . Fatty liver disease, nonalcoholic   . Gout   . Hyperlipidemia   . Hypertension   . Kidney stone   . Vitamin D deficiency      Allergies  Allergen Reactions  . 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 Welchol [Colesevelam Hcl]     Chest pain    Current Outpatient Medications on File Prior to Visit  Medication Sig  . allopurinol (ZYLOPRIM) 300 MG tablet TAKE 1 TABLET BY MOUTH EVERY DAY  . aspirin 81 MG tablet Take 81 mg by mouth daily.  Marland Kitchen atenolol (TENORMIN) 50 MG tablet Take 1 tablet Daily for BP  . benazepril (LOTENSIN) 20 MG tablet TAKE 1 TABLET AT BEDTIME FOR BLOOD PRESSURE AND KIDNEY  . Blood Glucose Monitoring Suppl (FREESTYLE LITE) DEVI 1 device to check sugars, patient on insulin  .  Cholecalciferol (VITAMIN D) 50 MCG (2000 UT) CAPS Take 2 capsules (4,000 Units total) by mouth daily.  Marland Kitchen glucose blood (FREESTYLE LITE) test strip Test sugar 3 x daily due to hyperglycemia and insulin use. E11.22  . insulin detemir (LEVEMIR) 100 UNIT/ML injection Increase to 15 units subcutaneously daily, then if fasting sugars still 150+ in 2 weeks increase to 18 units.  . Magnesium 500 MG TABS Take  by mouth daily.  . metFORMIN (GLUCOPHAGE-XR) 500 MG 24 hr tablet Take 2 tablets 2 x/ day for Diabetes  . Multiple Vitamin (MULTIVITAMIN WITH MINERALS) TABS tablet Take 1 tablet by mouth daily.  . phentermine (ADIPEX-P) 37.5 MG tablet Take 1 tablet every morning for Dieting & Weight Loss  . topiramate (TOPAMAX) 50 MG tablet Take 1/2 to 1 tablet 2 x / day at Suppertime & Bedtime for Dieting & Weight Loss  . vitamin B-12 (CYANOCOBALAMIN) 100 MCG tablet Take 100 mcg by mouth daily.  Marland Kitchen atorvastatin (LIPITOR) 10 MG tablet Take 1 tablet (10 mg total) by mouth daily.  . fenofibrate micronized (LOFIBRA) 134 MG capsule Take 1 tablet daily for blood fats   No current facility-administered medications on file prior to visit.     ROS: all negative except above.   Physical Exam:  BP (!) 142/76   Pulse 71   Temp 97.9 F (36.6 C)   Wt 262 lb (118.8 kg)   SpO2 97%   BMI 34.10 kg/m   General Appearance: Well nourished, in no apparent distress. Eyes: PERRLA, EOMs, conjunctiva no swelling or erythema Sinuses: No Frontal/maxillary tenderness ENT/Mouth: Ext aud canals clear, TMs without erythema, bulging. No erythema, swelling, or exudate on post pharynx.  Tonsils not swollen or erythematous. Hearing normal.  Neck: Supple, thyroid normal.  Respiratory: Respiratory effort normal, BS equal bilaterally without rales, rhonchi, wheezing or stridor.  Cardio: RRR with no MRGs. Brisk peripheral pulses without edema.  Abdomen: Soft, + BS.  Non tender, no guarding, rebound, hernias, masses. Lymphatics: Non tender without lymphadenopathy.  Musculoskeletal: Full ROM, 5/5 strength, normal gait.  Skin: Warm, dry without rashes, lesions, ecchymosis.  Neuro: Cranial nerves intact. Normal muscle tone, no cerebellar symptoms. Sensation intact.  Psych: Awake and oriented X 3, normal affect, Insight and Judgment appropriate.     Garnet Sierras, NP 10:21 AM [facility]

## 2019-09-14 ENCOUNTER — Other Ambulatory Visit: Payer: Self-pay

## 2019-09-14 DIAGNOSIS — Z20822 Contact with and (suspected) exposure to covid-19: Secondary | ICD-10-CM

## 2019-09-15 LAB — NOVEL CORONAVIRUS, NAA: SARS-CoV-2, NAA: NOT DETECTED

## 2019-09-23 ENCOUNTER — Other Ambulatory Visit: Payer: Self-pay | Admitting: Internal Medicine

## 2019-09-28 ENCOUNTER — Encounter: Payer: Self-pay | Admitting: Adult Health Nurse Practitioner

## 2019-09-28 NOTE — Patient Instructions (Signed)
You received your Influenza vaccination today.   For reflux try over the counter Prilosec as needed.  If this does not work for your symptoms  You can also try Nexium.  If it is consistent please let us know.  We will check your A1C today.  Likely we will increase you levemir.  We will respond via myChart in 1-3 days.  You are due for the Shingrix vaccination.  You may get this an your local pharmacy.  Contact them for available to this.  There may be a wait list.  Be low is some information about this vaccination from [facility], [place].  Shingrix, which was approved by the FDA in October 2017. Doses are given two to six months apart. Shingrix is said to be more than 90% effective against shingles and postherpetic neuralgia -- a painful nerve condition that can arise as a shingles complication.  Zostavax has been used since 2006 and has been reported to reduce the risk of shingles by 51%. Research has also shown that Zostavax loses its ability to prevent shingles after five years. If you've ever had chickenpox, you are at risk for shingles, which is essentially a re-emergence of the virus that caused your chickenpox. The CDC recommends to get the Shingrix vaccination even if you aren't sure you've had chickenpox and if you've already had shingles. Although it's uncommon, you can get shingles more than once. In addition, you should get the Shingrix vaccine even if you already got the Zostavax vaccine.  The wait time between these vaccinations is eight weeks.  Who Should Get Shingrix? Healthy adults 50 years and older should get two doses of Shingrix, separated by 2 to 6 months. You should get Shingrix even if in the past you  had shingles   received Zostavax   are not sure if you had chickenpox There is no maximum age for getting Shingrix. If you had shingles in the past, you can get Shingrix to help prevent future occurrences of the disease. There is no  specific length of time that you need to wait after having shingles before you can receive Shingrix, but generally you should make sure the shingles rash has gone away before getting vaccinated. You can get Shingrix whether or not you remember having had chickenpox in the past. Studies show that more than 99% of Americans 40 years and older have had chickenpox, even if they dont remember having the disease. Chickenpox and shingles are related because they are caused by the same virus (varicella zoster virus). After a person recovers from chickenpox, the virus stays dormant (inactive) in the body. It can reactivate years later and cause shingles. If you had Zostavax in the recent past, you should wait at least eight weeks before getting Shingrix. Talk to your healthcare provider to determine the best time to get Shingrix. Shingrix is available in doctors offices and pharmacies. To find doctors offices or pharmacies near you that offer the vaccine, visit [facility] Vaccine FinderExternal. If you have questions about Shingrix, talk with your healthcare provider. Vaccine for Those 40 Years and Older  Shingrix reduces the risk of shingles and PHN by more than 90% in people 65 and older. CDC recommends the vaccine for healthy adults 16 and older.   Who Should Not Get Shingrix? You should not get Shingrix if you:  have ever had a severe allergic reaction to any component of the vaccine or after a dose of Shingrix   tested  negative for immunity to varicella zoster virus. If you test negative, you should get chickenpox vaccine.   currently have shingles   currently are pregnant or breastfeeding. Women who are pregnant or breastfeeding should wait to get Shingrix.   receive specific antiviral drugs (acyclovir, famciclovir, or valacyclovir) 24 hours before vaccination (avoid use of these antiviral drugs for 14 days after vaccination)- zoster vaccine live only If you have a minor acute (starts suddenly)  illness, such as a cold, you may get Shingrix. But if you have a moderate or severe acute illness, you should usually wait until you recover before getting the vaccine. This includes anyone with a temperature of 101.30F or higher. The side effects of the Shingrix are temporary, and usually last 2 to 3 days. While you may experience pain for a few days after getting Shingrix, the pain will be less severe than having shingles and the complications from the disease.  Risks of Shingrix vaccination:      A sore arm with mild or moderate pain is very common after recombinant shingles vaccine, affecting about 80% of vaccinated people. Redness and swelling can also happen at the site of the injection.     Tiredness, muscle pain, headache, shivering, fever, stomach pain, and nausea happen after vaccination in more than half of people who receive recombinant shingles vaccine.  In clinical trials, about 1 out of 6 people who got recombinant zoster vaccine experienced side effects that prevented them from doing regular activities. Symptoms usually went away on their own in 2 to 3 days.  You should still get the second dose of recombinant zoster vaccine even if you had one of these reactions after the first dose.  People sometimes faint after medical procedures, including vaccination. Tell your provider if you feel dizzy or have vision changes or ringing in the ears.  As with any medicine, there is a very remote chance of a vaccine causing a severe allergic reaction, other serious injury, or death.      Vit D  & Vit C 1,000 mg   are recommended to help protect  against the Covid-19 and other Corona viruses.    Also it's recommended  to take  Zinc 50 mg  to help  protect against the Covid-19   and best place to get  is also on [email or web address]  and don't pay more than 6-8 cents /pill !  =============================== Coronavirus (COVID-19) Are you at risk?  Are you at risk for the Coronavirus  (COVID-19)?  To be considered HIGH RISK for Coronavirus (COVID-19), you have to meet the following criteria:   Traveled to [place], [place], [place], [place] or [place]; or in the [place] to [place], [place], [place]   or [place]; and have fever, cough, and shortness of breath within the last 2 weeks of travel OR  Been in close contact with a person diagnosed with COVID-19 within the last 2 weeks and have   fever, cough,and shortness of breath    IF YOU DO NOT MEET THESE CRITERIA, YOU ARE CONSIDERED LOW RISK FOR COVID-19.  What to do if you are HIGH RISK for COVID-19?   If you are having a medical emergency, call 911.  Seek medical care right away. Before you go to a doctors office, urgent care or emergency department,   call ahead and tell them about your recent travel, contact with someone diagnosed with COVID-19    and your symptoms.   You should receive instructions from  your physicians office regarding next steps of care.   When you arrive at healthcare provider, tell the healthcare staff immediately you have returned from   visiting [place], [place], [place], [place] or [place]; or traveled in the [place] to [place], [place],   [place] or [place] in the last two weeks or you have been in close contact with a person diagnosed with   COVID-19 in the last 2 weeks.    Tell the health care staff about your symptoms: fever, cough and shortness of breath.  After you have been seen by a medical provider, you will be either: o Tested for (COVID-19) and discharged home on quarantine except to seek medical care if  o symptoms worsen, and asked to  - Stay home and avoid contact with others until you get your results (4-5 days)  - Avoid travel on public transportation if possible (such as bus, train, or airplane) or o Sent to the Emergency Department by EMS for evaluation, COVID-19 testing  and  o possible admission depending on your condition and test  results.  What to do if you are LOW RISK for COVID-19?  Reduce your risk of any infection by using the same precautions used for avoiding the common cold or flu:   Wash your hands often with soap and warm water for at least 20 seconds.  If soap and water are not readily available,   use an alcohol-based hand sanitizer with at least 60% alcohol.   If coughing or sneezing, cover your mouth and nose by coughing or sneezing into the elbow areas of your shirt or coat,   into a tissue or into your sleeve (not your hands).  Avoid shaking hands with others and consider head nods or verbal greetings only.  Avoid touching your eyes, nose, or mouth with unwashed hands.   Avoid close contact with people who are sick.  Avoid places or events with large numbers of people in one location, like concerts or sporting events.  Carefully consider travel plans you have or are making.  If you are planning any travel outside or inside the [place], visit the [place] webpage for the latest health notices.  If you have some symptoms but not all symptoms, continue to monitor at home and seek medical attention   if your symptoms worsen.  If you are having a medical emergency, call 911. >>>>>>>>>>>>>>>>>>>>>>>>>>>> Preventive Care for Adults  A healthy lifestyle and preventive care can promote health and wellness. Preventive health guidelines for men include the following key practices:  A routine yearly physical is a good way to check with your health care provider about your health and preventative screening. It is a chance to share any concerns and updates on your health and to receive a thorough exam.  Visit your dentist for a routine exam and preventative care every 6 months. Brush your teeth twice a day and floss once a day. Good oral hygiene prevents tooth decay and gum disease.  The frequency of eye exams is based on your age, health, family medical history, use of contact lenses, and  other factors. Follow your health care provider's recommendations for frequency of eye exams.  Eat a healthy diet. Foods such as vegetables, fruits, whole grains, low-fat dairy products, and lean protein foods contain the nutrients you need without too many calories. Decrease your intake of foods high in solid fats, added sugars, and salt. Eat the right amount of calories for you. Get information  about a proper diet from your health care provider, if necessary.  Regular physical exercise is one of the most important things you can do for your health. Most adults should get at least 150 minutes of moderate-intensity exercise (any activity that increases your heart rate and causes you to sweat) each week. In addition, most adults need muscle-strengthening exercises on 2 or more days a week.  Maintain a healthy weight. The body mass index (BMI) is a screening tool to identify possible weight problems. It provides an estimate of body fat based on height and weight. Your health care provider can find your BMI and can help you achieve or maintain a healthy weight. For adults 20 years and older:  A BMI below 18.5 is considered underweight.  A BMI of 18.5 to 24.9 is normal.  A BMI of 25 to 29.9 is considered overweight.  A BMI of 30 and above is considered obese.  Maintain normal blood lipids and cholesterol levels by exercising and minimizing your intake of saturated fat. Eat a balanced diet with plenty of fruit and vegetables. Blood tests for lipids and cholesterol should begin at age 41 and be repeated every 5 years. If your lipid or cholesterol levels are high, you are over 50, or you are at high risk for heart disease, you may need your cholesterol levels checked more frequently. Ongoing high lipid and cholesterol levels should be treated with medicines if diet and exercise are not working.  If you smoke, find out from your health care provider how to quit. If you do not use tobacco, do not  start.  Lung cancer screening is recommended for adults aged 79-80 years who are at high risk for developing lung cancer because of a history of smoking. A yearly low-dose CT scan of the lungs is recommended for people who have at least a 30-pack-year history of smoking and are a current smoker or have quit within the past 15 years. A pack year of smoking is smoking an average of 1 pack of cigarettes a day for 1 year (for example: 1 pack a day for 30 years or 2 packs a day for 15 years). Yearly screening should continue until the smoker has stopped smoking for at least 15 years. Yearly screening should be stopped for people who develop a health problem that would prevent them from having lung cancer treatment.  If you choose to drink alcohol, do not have more than 2 drinks per day. One drink is considered to be 12 ounces (355 mL) of beer, 5 ounces (148 mL) of wine, or 1.5 ounces (44 mL) of liquor.  Avoid use of street drugs. Do not share needles with anyone. Ask for help if you need support or instructions about stopping the use of drugs.  High blood pressure causes heart disease and increases the risk of stroke. Your blood pressure should be checked at least every 1-2 years. Ongoing high blood pressure should be treated with medicines, if weight loss and exercise are not effective.  If you are 72-61 years old, ask your health care provider if you should take aspirin to prevent heart disease.  Diabetes screening involves taking a blood sample to check your fasting blood sugar level. This should be done once every 3 years, after age 53, if you are within normal weight and without risk factors for diabetes. Testing should be considered at a younger age or be carried out more frequently if you are overweight and have at least 1 risk factor  for diabetes.  Colorectal cancer can be detected and often prevented. Most routine colorectal cancer screening begins at the age of 69 and continues through age 110.  However, your health care provider may recommend screening at an earlier age if you have risk factors for colon cancer. On a yearly basis, your health care provider may provide home test kits to check for hidden blood in the stool. Use of a small camera at the end of a tube to directly examine the colon (sigmoidoscopy or colonoscopy) can detect the earliest forms of colorectal cancer. Talk to your health care provider about this at age 70, when routine screening begins. Direct exam of the colon should be repeated every 5-10 years through age 66, unless early forms of precancerous polyps or small growths are found.   Talk with your health care provider about prostate cancer screening.  Testicular cancer screening isrecommended for adult males. Screening includes self-exam, a health care provider exam, and other screening tests. Consult with your health care provider about any symptoms you have or any concerns you have about testicular cancer.  Use sunscreen. Apply sunscreen liberally and repeatedly throughout the day. You should seek shade when your shadow is shorter than you. Protect yourself by wearing long sleeves, pants, a wide-brimmed hat, and sunglasses year round, whenever you are outdoors.  Once a month, do a whole-body skin exam, using a mirror to look at the skin on your back. Tell your health care provider about new moles, moles that have irregular borders, moles that are larger than a pencil eraser, or moles that have changed in shape or color.  Stay current with required vaccines (immunizations).  Influenza vaccine. All adults should be immunized every year.  Tetanus, diphtheria, and acellular pertussis (Td, Tdap) vaccine. An adult who has not previously received Tdap or who does not know his vaccine status should receive 1 dose of Tdap. This initial dose should be followed by tetanus and diphtheria toxoids (Td) booster doses every 10 years. Adults with an unknown or incomplete history of  completing a 3-dose immunization series with Td-containing vaccines should begin or complete a primary immunization series including a Tdap dose. Adults should receive a Td booster every 10 years.  Varicella vaccine. An adult without evidence of immunity to varicella should receive 2 doses or a second dose if he has previously received 1 dose.  Human papillomavirus (HPV) vaccine. Males aged 81-21 years who have not received the vaccine previously should receive the 3-dose series. Males aged 22-26 years may be immunized. Immunization is recommended through the age of 17 years for any male who has sex with males and did not get any or all doses earlier. Immunization is recommended for any person with an immunocompromised condition through the age of 30 years if he did not get any or all doses earlier. During the 3-dose series, the second dose should be obtained 4-8 weeks after the first dose. The third dose should be obtained 24 weeks after the first dose and 16 weeks after the second dose.  Zoster vaccine. One dose is recommended for adults aged 4 years or older unless certain conditions are present.    PREVNAR  - Pneumococcal 13-valent conjugate (PCV13) vaccine. When indicated, a person who is uncertain of his immunization history and has no record of immunization should receive the PCV13 vaccine. An adult aged 31 years or older who has certain medical conditions and has not been previously immunized should receive 1 dose of PCV13 vaccine. This PCV13  should be followed with a dose of pneumococcal polysaccharide (PPSV23) vaccine. The PPSV23 vaccine dose should be obtained at least 1 r more year(s) after the dose of PCV13 vaccine. An adult aged 69 years or older who has certain medical conditions and previously received 1 or more doses of PPSV23 vaccine should receive 1 dose of PCV13. The PCV13 vaccine dose should be obtained 1 or more years after the last PPSV23 vaccine dose.    PNEUMOVAX -  Pneumococcal polysaccharide (PPSV23) vaccine. When PCV13 is also indicated, PCV13 should be obtained first. All adults aged 85 years and older should be immunized. An adult younger than age 90 years who has certain medical conditions should be immunized. Any person who resides in a nursing home or long-term care facility should be immunized. An adult smoker should be immunized. People with an immunocompromised condition and certain other conditions should receive both PCV13 and PPSV23 vaccines. People with human immunodeficiency virus (HIV) infection should be immunized as soon as possible after diagnosis. Immunization during chemotherapy or radiation therapy should be avoided. Routine use of PPSV23 vaccine is not recommended for American Indians, [place] Natives, or people younger than 65 years unless there are medical conditions that require PPSV23 vaccine. When indicated, people who have unknown immunization and have no record of immunization should receive PPSV23 vaccine. One-time revaccination 5 years after the first dose of PPSV23 is recommended for people aged 19-64 years who have chronic kidney failure, nephrotic syndrome, asplenia, or immunocompromised conditions. People who received 1-2 doses of PPSV23 before age 70 years should receive another dose of PPSV23 vaccine at age 21 years or later if at least 5 years have passed since the previous dose. Doses of PPSV23 are not needed for people immunized with PPSV23 at or after age 33 years.    Hepatitis A vaccine. Adults who wish to be protected from this disease, have certain high-risk conditions, work with hepatitis A-infected animals, work in hepatitis A research labs, or travel to or work in countries with a high rate of hepatitis A should be immunized. Adults who were previously unvaccinated and who anticipate close contact with an international adoptee during the first 60 days after arrival in the [place] from a country with a high rate of  hepatitis A should be immunized.    Hepatitis B vaccine. Adults should be immunized if they wish to be protected from this disease, have certain high-risk conditions, may be exposed to blood or other infectious body fluids, are household contacts or sex partners of hepatitis B positive people, are clients or workers in certain care facilities, or travel to or work in countries with a high rate of hepatitis B.   Preventive Service / Frequency   Ages 2 to 73  Blood pressure check.  Lipid and cholesterol check  Lung cancer screening. / Every year if you are aged 62-80 years and have a 30-pack-year history of smoking and currently smoke or have quit within the past 15 years. Yearly screening is stopped once you have quit smoking for at least 15 years or develop a health problem that would prevent you from having lung cancer treatment.  Fecal occult blood test (FOBT) of stool. / Every year beginning at age 55 and continuing until age 14. You may not have to do this test if you get a colonoscopy every 10 years.  Flexible sigmoidoscopy** or colonoscopy.** / Every 5 years for a flexible sigmoidoscopy or every 10 years for a colonoscopy beginning at age 41 and  continuing until age 77. Screening for abdominal aortic aneurysm (AAA)  by ultrasound is recommended for people who have history of high blood pressure or who are current or former smokers. +++++++++++ Recommend Adult Low Dose Aspirin or  coated  Aspirin 81 mg daily  To reduce risk of Colon Cancer 40 %,  Skin Cancer 26 % ,  Malignant Melanoma 46%  and  Pancreatic cancer 60% ++++++++++++++++++++ Vitamin D goal  is between 70-100.  Please make sure that you are taking your Vitamin D as directed.  It is very important as a natural anti-inflammatory  helping hair, skin, and nails, as well as reducing stroke and heart attack risk.  It helps your bones and helps with mood. It also decreases numerous cancer risks so please take it as  directed.  Low Vit D is associated with a 200-300% higher risk for CANCER  and 200-300% higher risk for HEART   ATTACK  &  STROKE.   .....................................Marland Kitchen It is also associated with higher death rate at younger ages,  autoimmune diseases like Rheumatoid arthritis, Lupus, Multiple Sclerosis.    Also many other serious conditions, like depression, Alzheimer's Dementia, infertility, muscle aches, fatigue, fibromyalgia - just to name a few. +++++++++++++++++++++ Recommend the book "The END of DIETING" by Dr Excell Seltzer  & the book "The END of DIABETES " by Dr Excell Seltzer At [email or web address] - get book & Audio CD's    Being diabetic has a  300% increased risk for heart attack, stroke, cancer, and alzheimer- type vascular dementia. It is very important that you work harder with diet by avoiding all foods that are white. Avoid white rice (brown & wild rice is OK), white potatoes (sweetpotatoes in moderation is OK), White bread or wheat bread or anything made out of white flour like bagels, donuts, rolls, buns, biscuits, cakes, pastries, cookies, pizza crust, and pasta (made from white flour & egg whites) - vegetarian pasta or spinach or wheat pasta is OK. Multigrain breads like Arnold's or [employer], or multigrain sandwich thins or flatbreads.  Diet, exercise and weight loss can reverse and cure diabetes in the early stages.  Diet, exercise and weight loss is very important in the control and prevention of complications of diabetes which affects every system in your body, ie. Brain - dementia/stroke, eyes - glaucoma/blindness, heart - heart attack/heart failure, kidneys - dialysis, stomach - gastric paralysis, intestines - malabsorption, nerves - severe painful neuritis, circulation - gangrene & loss of a leg(s), and finally cancer and Alzheimers.    I recommend avoid fried & greasy foods,  sweets/candy, white rice (brown or wild rice or Quinoa is OK), white potatoes (sweet potatoes are OK)  - anything made from white flour - bagels, doughnuts, rolls, buns, biscuits,white and wheat breads, pizza crust and traditional pasta made of white flour & egg white(vegetarian pasta or spinach or wheat pasta is OK).  Multi-grain bread is OK - like multi-grain flat bread or sandwich thins. Avoid alcohol in excess. Exercise is also important.    Eat all the vegetables you want - avoid meat, especially red meat and dairy - especially cheese.  Cheese is the most concentrated form of trans-fats which is the worst thing to clog up our arteries. Veggie cheese is OK which can be found in the fresh produce section at [employer] or [employer] or [employer]  ++++++++++++++++++++++ DASH Eating Plan  DASH stands for "Dietary Approaches to Stop Hypertension."   The DASH eating plan is a healthy eating  plan that has been shown to reduce high blood pressure (hypertension). Additional health benefits may include reducing the risk of type 2 diabetes mellitus, heart disease, and stroke. The DASH eating plan may also help with weight loss. WHAT DO I NEED TO KNOW ABOUT THE DASH EATING PLAN? For the DASH eating plan, you will follow these general guidelines:  Choose foods with a percent daily value for sodium of less than 5% (as listed on the food label).  Use salt-free seasonings or herbs instead of table salt or sea salt.  Check with your health care provider or pharmacist before using salt substitutes.  Eat lower-sodium products, often labeled as "lower sodium" or "no salt added."  Eat fresh foods.  Eat more vegetables, fruits, and low-fat dairy products.  Choose whole grains. Look for the word "whole" as the first word in the ingredient list.  Choose fish   Limit sweets, desserts, sugars, and sugary drinks.  Choose heart-healthy fats.  Eat veggie cheese   Eat more home-cooked food and less restaurant, buffet, and fast food.  Limit fried foods.  Cook foods using methods other than  frying.  Limit canned vegetables. If you do use them, rinse them well to decrease the sodium.  When eating at a restaurant, ask that your food be prepared with less salt, or no salt if possible.                      WHAT FOODS CAN I EAT? Read Dr Fara Olden Fuhrman's books on The End of Dieting & The End of Diabetes  Grains Whole grain or whole wheat bread. Brown rice. Whole grain or whole wheat pasta. Quinoa, bulgur, and whole grain cereals. Low-sodium cereals. Corn or whole wheat flour tortillas. Whole grain cornbread. Whole grain crackers. Low-sodium crackers.  Vegetables Fresh or frozen vegetables (raw, steamed, roasted, or grilled). Low-sodium or reduced-sodium tomato and vegetable juices. Low-sodium or reduced-sodium tomato sauce and paste. Low-sodium or reduced-sodium canned vegetables.   Fruits All fresh, canned (in natural juice), or frozen fruits.  Protein Products  All fish and seafood.  Dried beans, peas, or lentils. Unsalted nuts and seeds. Unsalted canned beans.  Dairy Low-fat dairy products, such as skim or 1% milk, 2% or reduced-fat cheeses, low-fat ricotta or cottage cheese, or plain low-fat yogurt. Low-sodium or reduced-sodium cheeses.  Fats and Oils Tub margarines without trans fats. Light or reduced-fat mayonnaise and salad dressings (reduced sodium). Avocado. Safflower, olive, or canola oils. Natural peanut or almond butter.  Other Unsalted popcorn and pretzels. The items listed above may not be a complete list of recommended foods or beverages. Contact your dietitian for more options.  +++++++++++++++++++  WHAT FOODS ARE NOT RECOMMENDED? Grains/ White flour or wheat flour White bread. White pasta. White rice. Refined cornbread. Bagels and croissants. Crackers that contain trans fat.  Vegetables  Creamed or fried vegetables. Vegetables in a . Regular canned vegetables. Regular canned tomato sauce and paste. Regular tomato and vegetable juices.  Fruits Dried  fruits. Canned fruit in light or heavy syrup. Fruit juice.  Meat and Other Protein Products Meat in general - RED meat & White meat.  Fatty cuts of meat. Ribs, chicken wings, all processed meats as bacon, sausage, bologna, salami, fatback, hot dogs, bratwurst and packaged luncheon meats.  Dairy Whole or 2% milk, cream, half-and-half, and cream cheese. Whole-fat or sweetened yogurt. Full-fat cheeses or blue cheese. Non-dairy creamers and whipped toppings. Processed cheese, cheese spreads, or cheese curds.  Condiments Onion  and garlic salt, seasoned salt, table salt, and sea salt. Canned and packaged gravies. Worcestershire sauce. Tartar sauce. Barbecue sauce. Teriyaki sauce. Soy sauce, including reduced sodium. Steak sauce. Fish sauce. Oyster sauce. Cocktail sauce. Horseradish. Ketchup and mustard. Meat flavorings and tenderizers. Bouillon cubes. Hot sauce. Tabasco sauce. Marinades. Taco seasonings. Relishes.  Fats and Oils Butter, stick margarine, lard, shortening and bacon fat. Coconut, palm kernel, or palm oils. Regular salad dressings.  Pickles and olives. Salted popcorn and pretzels.  The items listed above may not be a complete list of foods and beverages to avoid.

## 2019-09-28 NOTE — Progress Notes (Signed)
3 Month Follow Up   Assessment and Plan:     Arthur Hudson was seen today for follow-up.  Diagnoses and all orders for this visit:  Essential hypertension Continue current medications: atenolol 50mg  daily Monitor blood pressure at home; call if consistently over 130/80 Continue DASH diet.   Reminder to go to the ER if any CP, SOB, nausea, dizziness, severe HA, changes vision/speech, left arm numbness and tingling and jaw pain. -     CBC with Diff -     COMPLETE METABOLIC PANEL WITH GFR -     Magnesium  Hyperlipidemia associated with type 2 diabetes mellitus (HCC) Continue medications: Atorvastatin 10mg  daily and fenofibrate 134mg  Discussed dietary and exercise modifications Low fat diet -     Lipid Profile  Type 2 DM with CKD stage 2 and hypertension (HCC) Continue medications: Levemir 28units daily, meformin 500mg  two tabs BID. Discussed general issues about diabetes pathophysiology and management. Education: Reviewed 'ABCs' of diabetes management (respective goals in parentheses):  A1C (<7), blood pressure (<130/80), and cholesterol (LDL <70) Dietary recommendations Encouraged aerobic exercise.  Discussed foot care, check daily Yearly retinal exam Dental exam every 6 months Monitor blood glucose, discussed goal for patient -     Hemoglobin A1c ([facility]) -     Cancel: Insulin, random  CKD stage 2 due to type 2 diabetes mellitus (HCC) Continue glucose monitoring Continue medications Increase fluids Avoid NSAIDS Discussed dietary modifications and exercise Will continue to monitor  Poorly controlled type 2 diabetes mellitus with peripheral neuropathy (HCC) See above Checks feet daily  Idiopathic gout, unspecified chronicity, unspecified site Continue allopurinol 300mg  daily No recent flares Discussed dietary modifications Continue to monitor  Vitamin D deficiency Continue supplementation Taking Vitamin D 4,000 IU daily -     Vitamin D (25 hydroxy)  Testosterone  deficiency Not taking testosterone supplementation at this time   Obesity (BMI 30.0-34.9) Discussed dietary and exercise modifications  Medication management Continued  Needs flu shot -     FLU VACCINE MDCK QUAD W/Preservative  Screening for colorectal cancer -     Cologuard    Continue diet and meds as discussed. Further disposition pending results of labs. Discussed med's effects and SE's.  Patient agrees with plan of care and opportunity to ask questions/voice concerns. Over 30 minutes of chart review, interview, exam, counseling, and critical decision making was performed.   Future Appointments  Date Time Provider [place]  01/12/2020  9:00 AM Arthur Pinto, MD [facility] None    ----------------------------------------------------------------------------------------------------------------------  HPI 53 y.o. male  presents for 3 month follow up on HTN, HLD, DMII, gout, weight and vitamin D deficiency.   He was last seen on 08/18/19 in our office for cough.  Prior he had a chest xray which was negative and treated for viral symptoms.  Since then he has had a COVID-19 test, negative result 09/14/19.  Reports the cough has since resolved.  BMI is Body mass index is 34.36 kg/m., he has not been working on diet and exercise. Wt Readings from Last 3 Encounters:  09/29/19 264 lb (119.7 kg)  08/18/19 262 lb (118.8 kg)  06/23/19 256 lb 3.2 oz (116.2 kg)      His blood pressure has not been controlled at home, today their BP is BP: 140/80  He does not workout. He denies any cardiac symptoms, chest pains, palpitations, shortness of breath, dizziness or lower extremity edema.     He is on cholesterol medication Atorvastatin and Fenofibrate and denies myalgias.  His cholesterol is not at goal. The cholesterol last visit was:   Lab Results  Component Value Date   CHOL 141 06/23/2019   HDL 31 (L) 06/23/2019   [place]  06/23/2019     Comment:     . LDL cholesterol  not calculated. Triglyceride levels greater than 400 mg/dL invalidate calculated LDL results. . Reference range: <100 . Desirable range <100 mg/dL for primary prevention;   <70 mg/dL for patients with CHD or diabetic patients  with > or = 2 CHD risk factors. Marland Kitchen LDL-C is now calculated using the Martin-Hopkins  calculation, which is a validated novel method providing  better accuracy than the Friedewald equation in the  estimation of LDL-C.  Arthur Hudson et al. Annamaria Helling. [id number]): [id number]  (http://education.[employer].com/faq/FAQ164)    TRIG 475 (H) 06/23/2019   CHOLHDL 4.5 06/23/2019    He has not been working on diet and exercise for DMII, and denies polydipsia, polyuria, visual disturbances, vomiting and weight loss. He is taking Levemir 28 units daily every AM and he increased this three weeks ago.  He is also taking Meformin XR 500mg  two tablets twice a day.  Reports his fasting blood glucose ranges form 160-180.    Last A1C in the office was:  Lab Results  Component Value Date   HGBA1C 12.2 (H) 06/23/2019   Patient is on Vitamin D supplement for deficiency and taking 4,000IU daily.   Lab Results  Component Value Date   VD25OH 20 (L) 06/23/2019       Current Medications:  Current Outpatient Medications on File Prior to Visit  Medication Sig  . allopurinol (ZYLOPRIM) 300 MG tablet TAKE 1 TABLET BY MOUTH EVERY DAY  . aspirin 81 MG tablet Take 81 mg by mouth daily.  Marland Kitchen atenolol (TENORMIN) 50 MG tablet Take 1 tablet Daily for BP  . benazepril (LOTENSIN) 20 MG tablet TAKE 1 TABLET AT BEDTIME FOR BLOOD PRESSURE AND KIDNEY  . bisoprolol-hydrochlorothiazide (ZIAC) 5-6.25 MG tablet Take 1 tablet Daily for BP  . Blood Glucose Monitoring Suppl (FREESTYLE LITE) DEVI 1 device to check sugars, patient on insulin  . Cholecalciferol (VITAMIN D) 50 MCG (2000 UT) CAPS Take 2 capsules (4,000 Units total) by mouth daily.  Marland Kitchen glucose blood (FREESTYLE LITE) test strip Test sugar 3 x daily  due to hyperglycemia and insulin use. E11.22  . insulin detemir (LEVEMIR) 100 UNIT/ML injection Increase to 15 units subcutaneously daily, then if fasting sugars still 150+ in 2 weeks increase to 18 units.  . Magnesium 500 MG TABS Take by mouth daily.  . metFORMIN (GLUCOPHAGE-XR) 500 MG 24 hr tablet Take 2 tablets 2 x/ day for Diabetes  . Multiple Vitamin (MULTIVITAMIN WITH MINERALS) TABS tablet Take 1 tablet by mouth daily.  . phentermine (ADIPEX-P) 37.5 MG tablet Take 1 tablet every morning for Dieting & Weight Loss  . topiramate (TOPAMAX) 50 MG tablet Take 1/2 to 1 tablet 2 x / day at Suppertime & Bedtime for Dieting & Weight Loss  . vitamin B-12 (CYANOCOBALAMIN) 100 MCG tablet Take 100 mcg by mouth daily.  Marland Kitchen atorvastatin (LIPITOR) 10 MG tablet Take 1 tablet (10 mg total) by mouth daily.  . fenofibrate micronized (LOFIBRA) 134 MG capsule Take 1 tablet daily for blood fats   No current facility-administered medications on file prior to visit.     Allergies:  Allergies  Allergen Reactions  . 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 Welchol [Colesevelam Hcl]  Chest pain     Medical History:  Past Medical History:  Diagnosis Date  . Carpal tunnel syndrome on both sides   . Diabetes mellitus type 2, insulin dependent ([place])   . Fatty liver disease, nonalcoholic   . Gout   . Hyperlipidemia   . Hypertension   . Kidney stone   . Vitamin D deficiency     Family history- Reviewed and unchanged   Social history- Reviewed and unchanged   Patient Care Team: Arthur Pinto, MD as PCP - General (Internal Medicine)   Screening Tests: Immunization History  Administered Date(s) Administered  . Influenza Inj Mdck Quad With Preservative 09/17/2017, 09/16/2018, 09/29/2019  . Influenza Split 09/05/2015  . Influenza,inj,quad, With Preservative 10/22/2016  . Pneumococcal Polysaccharide-23 09/05/2015  . Pneumococcal-Unspecified 11/22/2009  . Td 09/17/2017  .  Tdap 11/22/2006     Vaccinations: TD or Tdap: 2018  Influenza: Received  Pneumococcal: 08/2015 Prevnar13: 11/2009  Preventative Care: Last colonoscopy: Cologuard referral placed today Hep C screening [id number]): declines related to insurance coverage   Review of Systems:  Review of Systems  Constitutional: Negative for chills, diaphoresis, fever, malaise/fatigue and weight loss.  HENT: Negative for congestion, ear discharge, ear pain, hearing loss, nosebleeds, sinus pain, sore throat and tinnitus.   Eyes: Negative for blurred vision, double vision, photophobia, pain, discharge and redness.  Respiratory: Negative for cough, hemoptysis, sputum production, shortness of breath, wheezing and stridor.   Cardiovascular: Negative for chest pain, palpitations, orthopnea, claudication, leg swelling and PND.  Gastrointestinal: Negative for abdominal pain, blood in stool, constipation, diarrhea, heartburn, melena, nausea and vomiting.  Genitourinary: Negative for dysuria, flank pain, frequency, hematuria and urgency.  Musculoskeletal: Negative for back pain, falls, joint pain, myalgias and neck pain.  Skin: Negative for itching and rash.  Neurological: Negative for dizziness, tingling, tremors, sensory change, speech change, focal weakness, seizures, loss of consciousness, weakness and headaches.  Endo/Heme/Allergies: Negative for environmental allergies and polydipsia. Does not bruise/bleed easily.  Psychiatric/Behavioral: Negative for depression, hallucinations, memory loss, substance abuse and suicidal ideas. The patient is not nervous/anxious and does not have insomnia.       Physical Exam: BP 140/80   Pulse 85   Temp 97.9 F (36.6 C)   Wt 264 lb (119.7 kg)   SpO2 98%   BMI 34.36 kg/m  Wt Readings from Last 3 Encounters:  09/29/19 264 lb (119.7 kg)  08/18/19 262 lb (118.8 kg)  06/23/19 256 lb 3.2 oz (116.2 kg)   General Appearance: Well nourished, in no apparent  distress. Eyes: PERRLA, EOMs, conjunctiva no swelling or erythema ENT/Mouth: Ext aud canals clear, TMs without erythema, bulging. Hearing normal.  Neck: Supple, thyroid normal.  Respiratory: Respiratory effort normal, BS equal bilaterally without rales, rhonchi, wheezing or stridor.  Cardio: RRR with no MRGs. Brisk peripheral pulses without edema.  Abdomen: Soft, + BS.  Non tender, no guarding, rebound, hernias, masses. Lymphatics: Non tender without lymphadenopathy.  Musculoskeletal: Full ROM, 5/5 strength, Normal gait Skin: Warm, dry without rashes, lesions, ecchymosis.  Neuro: Cranial nerves intact. No cerebellar symptoms.  Psych: Awake and oriented X 3, normal affect, Insight and Judgment appropriate.    Garnet Sierras, NP [facility] 9:50 AM

## 2019-09-29 ENCOUNTER — Ambulatory Visit (INDEPENDENT_AMBULATORY_CARE_PROVIDER_SITE_OTHER): Payer: PRIVATE HEALTH INSURANCE | Admitting: Adult Health Nurse Practitioner

## 2019-09-29 ENCOUNTER — Encounter: Payer: Self-pay | Admitting: Adult Health Nurse Practitioner

## 2019-09-29 ENCOUNTER — Other Ambulatory Visit: Payer: Self-pay

## 2019-09-29 VITALS — BP 140/80 | HR 85 | Temp 97.9°F | Wt 264.0 lb

## 2019-09-29 DIAGNOSIS — E669 Obesity, unspecified: Secondary | ICD-10-CM

## 2019-09-29 DIAGNOSIS — E349 Endocrine disorder, unspecified: Secondary | ICD-10-CM

## 2019-09-29 DIAGNOSIS — Z79899 Other long term (current) drug therapy: Secondary | ICD-10-CM | POA: Diagnosis not present

## 2019-09-29 DIAGNOSIS — M1000 Idiopathic gout, unspecified site: Secondary | ICD-10-CM

## 2019-09-29 DIAGNOSIS — I10 Essential (primary) hypertension: Secondary | ICD-10-CM

## 2019-09-29 DIAGNOSIS — Z23 Encounter for immunization: Secondary | ICD-10-CM

## 2019-09-29 DIAGNOSIS — N182 Chronic kidney disease, stage 2 (mild): Secondary | ICD-10-CM

## 2019-09-29 DIAGNOSIS — E1142 Type 2 diabetes mellitus with diabetic polyneuropathy: Secondary | ICD-10-CM

## 2019-09-29 DIAGNOSIS — I129 Hypertensive chronic kidney disease with stage 1 through stage 4 chronic kidney disease, or unspecified chronic kidney disease: Secondary | ICD-10-CM

## 2019-09-29 DIAGNOSIS — E785 Hyperlipidemia, unspecified: Secondary | ICD-10-CM

## 2019-09-29 DIAGNOSIS — Z1212 Encounter for screening for malignant neoplasm of rectum: Secondary | ICD-10-CM

## 2019-09-29 DIAGNOSIS — E559 Vitamin D deficiency, unspecified: Secondary | ICD-10-CM | POA: Diagnosis not present

## 2019-09-29 DIAGNOSIS — E1165 Type 2 diabetes mellitus with hyperglycemia: Secondary | ICD-10-CM

## 2019-09-29 DIAGNOSIS — E66811 Obesity, class 1: Secondary | ICD-10-CM

## 2019-09-29 DIAGNOSIS — E1169 Type 2 diabetes mellitus with other specified complication: Secondary | ICD-10-CM

## 2019-09-29 DIAGNOSIS — E1122 Type 2 diabetes mellitus with diabetic chronic kidney disease: Secondary | ICD-10-CM

## 2019-09-29 DIAGNOSIS — Z1211 Encounter for screening for malignant neoplasm of colon: Secondary | ICD-10-CM

## 2019-09-30 LAB — CBC WITH DIFFERENTIAL/PLATELET
Absolute Monocytes: 502 cells/uL (ref 200–950)
Basophils Absolute: 81 cells/uL (ref 0–200)
Basophils Relative: 1.3 %
Eosinophils Absolute: 260 cells/uL (ref 15–500)
Eosinophils Relative: 4.2 %
HCT: 39.8 % (ref 38.5–50.0)
Hemoglobin: 13.4 g/dL (ref 13.2–17.1)
Lymphs Abs: 2170 cells/uL (ref 850–3900)
MCH: 31.1 pg (ref 27.0–33.0)
MCHC: 33.7 g/dL (ref 32.0–36.0)
MCV: 92.3 fL (ref 80.0–100.0)
MPV: 11.9 fL (ref 7.5–12.5)
Monocytes Relative: 8.1 %
Neutro Abs: 3187 cells/uL (ref 1500–7800)
Neutrophils Relative %: 51.4 %
Platelets: 236 10*3/uL (ref 140–400)
RBC: 4.31 10*6/uL (ref 4.20–5.80)
RDW: 12.3 % (ref 11.0–15.0)
Total Lymphocyte: 35 %
WBC: 6.2 10*3/uL (ref 3.8–10.8)

## 2019-09-30 LAB — COMPLETE METABOLIC PANEL WITH GFR
AG Ratio: 1.3 (calc) (ref 1.0–2.5)
ALT: 28 U/L (ref 9–46)
AST: 20 U/L (ref 10–35)
Albumin: 4.4 g/dL (ref 3.6–5.1)
Alkaline phosphatase (APISO): 144 U/L (ref 35–144)
BUN: 16 mg/dL (ref 7–25)
CO2: 24 mmol/L (ref 20–32)
Calcium: 9.8 mg/dL (ref 8.6–10.3)
Chloride: 98 mmol/L (ref 98–110)
Creat: 0.79 mg/dL (ref 0.70–1.33)
GFR, Est African American: 119 mL/min/{1.73_m2} (ref >=60)
GFR, Est Non African American: 103 mL/min/{1.73_m2} (ref >=60)
Globulin: 3.3 g/dL (calc) (ref 1.9–3.7)
Glucose, Bld: 316 mg/dL — ABNORMAL HIGH (ref 65–99)
Potassium: 4.5 mmol/L (ref 3.5–5.3)
Sodium: 134 mmol/L — ABNORMAL LOW (ref 135–146)
Total Bilirubin: 0.3 mg/dL (ref 0.2–1.2)
Total Protein: 7.7 g/dL (ref 6.1–8.1)

## 2019-09-30 LAB — LIPID PANEL
Cholesterol: 152 mg/dL (ref <200)
HDL: 28 mg/dL — ABNORMAL LOW (ref >=40)
Non-HDL Cholesterol (Calc): 124 mg/dL (calc) (ref <130)
Total CHOL/HDL Ratio: 5.4 (calc) — ABNORMAL HIGH (ref <5.0)
Triglycerides: 759 mg/dL — ABNORMAL HIGH (ref <150)

## 2019-09-30 LAB — HEMOGLOBIN A1C
Hgb A1c MFr Bld: 12.7 % of total Hgb — ABNORMAL HIGH (ref <5.7)
Mean Plasma Glucose: 318 (calc)
eAG (mmol/L): 17.6 (calc)

## 2019-09-30 LAB — MAGNESIUM: Magnesium: 1.8 mg/dL (ref 1.5–2.5)

## 2019-09-30 LAB — VITAMIN D 25 HYDROXY (VIT D DEFICIENCY, FRACTURES): Vit D, 25-Hydroxy: 25 ng/mL — ABNORMAL LOW (ref 30–100)

## 2019-10-01 ENCOUNTER — Encounter: Payer: Self-pay | Admitting: Adult Health Nurse Practitioner

## 2019-10-01 ENCOUNTER — Other Ambulatory Visit: Payer: Self-pay | Admitting: Adult Health Nurse Practitioner

## 2019-10-01 DIAGNOSIS — E559 Vitamin D deficiency, unspecified: Secondary | ICD-10-CM

## 2019-10-01 DIAGNOSIS — E1169 Type 2 diabetes mellitus with other specified complication: Secondary | ICD-10-CM

## 2019-10-01 DIAGNOSIS — E785 Hyperlipidemia, unspecified: Secondary | ICD-10-CM

## 2019-10-01 MED ORDER — FENOFIBRATE MICRONIZED 200 MG PO CAPS: 200.0000 mg | ORAL_CAPSULE | Freq: Every day | ORAL | 1 refills | Status: DC

## 2019-10-01 MED ORDER — CHOLECALCIFEROL 1.25 MG (50000 UT) PO CAPS: ORAL_CAPSULE | ORAL | 0 refills | Status: DC

## 2019-11-03 ENCOUNTER — Other Ambulatory Visit: Payer: PRIVATE HEALTH INSURANCE

## 2019-11-03 ENCOUNTER — Ambulatory Visit: Payer: PRIVATE HEALTH INSURANCE | Attending: Internal Medicine

## 2019-11-03 DIAGNOSIS — Z20822 Contact with and (suspected) exposure to covid-19: Secondary | ICD-10-CM

## 2019-11-05 LAB — NOVEL CORONAVIRUS, NAA: SARS-CoV-2, NAA: NOT DETECTED

## 2019-11-30 ENCOUNTER — Other Ambulatory Visit: Payer: Self-pay | Admitting: Adult Health

## 2019-12-18 ENCOUNTER — Other Ambulatory Visit: Payer: Self-pay | Admitting: Internal Medicine

## 2019-12-21 ENCOUNTER — Encounter: Payer: Self-pay | Admitting: Internal Medicine

## 2019-12-24 ENCOUNTER — Other Ambulatory Visit: Payer: Self-pay | Admitting: Adult Health Nurse Practitioner

## 2019-12-24 DIAGNOSIS — E559 Vitamin D deficiency, unspecified: Secondary | ICD-10-CM

## 2020-01-11 ENCOUNTER — Encounter: Payer: Self-pay | Admitting: Internal Medicine

## 2020-01-11 NOTE — Patient Instructions (Signed)

## 2020-01-11 NOTE — Progress Notes (Signed)
This very nice 54 y.o. DBM presents for follow-up.  Patient has been followed for HTN, HLD, T2_IDDM  and Vitamin D Deficiency. Patient has hx/o Gout.     HTN predates circa 2007. Patient's BP has been controlled at home.  Today's BP is at goal - 118/76. Patient denies any cardiac symptoms as chest pain, palpitations, shortness of breath, dizziness or ankle swelling.     Patient's hyperlipidemia is partially controlled with diet and Atorvastatin. Patient denies myalgias or other medication SE's. Last lipids were at goal for Cholesterol, but Trig's were very elevated:  Lab Results  Component Value Date   CHOL 152 09/29/2019   HDL 28 (L) 09/29/2019   [place] not calculated 09/29/2019   TRIG 759 (H) 09/29/2019   CHOLHDL 5.4 (H) 09/29/2019       Patient has Moderate Obesity (BMI 32+) and consequent T2_NIDDM  (2007) w/CKD2 and patient  is on Metformin & Levemir.   POatient denies reactive hypoglycemic symptoms, visual blurring, diabetic polys or paresthesias. Last A1c was very poorly controlled:  Lab Results  Component Value Date   HGBA1C 12.7 (H) 09/29/2019        Finally, patient has history of Vitamin D Deficiency ("23" / 2008)  and last vitamin D was very low:  Lab Results  Component Value Date   VD25OH 25 (L) 09/29/2019    Current Outpatient Medications on File Prior to Visit  Medication Sig  . allopurinol (ZYLOPRIM) 300 MG tablet TAKE 1 TABLET BY MOUTH EVERY DAY  . aspirin 81 MG tablet Take 81 mg by mouth daily.  Marland Kitchen atenolol (TENORMIN) 50 MG tablet Take 1 tablet Daily for BP  . benazepril (LOTENSIN) 20 MG tablet Take 1 tablet Daily for BP & Diabetic Kidney Protection  . bisoprolol-hydrochlorothiazide (ZIAC) 5-6.25 MG tablet Take 1 tablet Daily for BP  . Blood Glucose Monitoring Suppl (FREESTYLE LITE) DEVI 1 device to check sugars, patient on insulin  . Cholecalciferol (VITAMIN D3) 1.25 MG (50000 UT) CAPS Take 1 capsule 3 x /week  . fenofibrate micronized (LOFIBRA) 200  MG capsule Take 1 capsule (200 mg total) by mouth daily before breakfast.  . glucose blood (FREESTYLE LITE) test strip Test sugar 3 x daily due to hyperglycemia and insulin use. E11.22  . insulin detemir (LEVEMIR) 100 UNIT/ML injection Increase to 15 units subcutaneously daily, then if fasting sugars still 150+ in 2 weeks increase to 18 units.  . Magnesium 500 MG TABS Take by mouth daily.  . metFORMIN (GLUCOPHAGE-XR) 500 MG 24 hr tablet Take 2 tablets 2 x/ day for Diabetes  . Multiple Vitamin (MULTIVITAMIN WITH MINERALS) TABS tablet Take 1 tablet by mouth daily.  . phentermine (ADIPEX-P) 37.5 MG tablet TAKE 1 TABLET EVERY MORNING FOR DIETING & WEIGHT LOSS  . topiramate (TOPAMAX) 50 MG tablet Take 1/2 to 1 tablet 2 x / day at Suppertime & Bedtime for Dieting & Weight Loss  . vitamin B-12 (CYANOCOBALAMIN) 100 MCG tablet Take 100 mcg by mouth daily.  Marland Kitchen atorvastatin (LIPITOR) 10 MG tablet Take 1 tablet (10 mg total) by mouth daily.   No current facility-administered medications on file prior to visit.   Allergies  Allergen Reactions  . 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 Welchol [Colesevelam Hcl]     Chest pain   Past Medical History:  Diagnosis Date  . Carpal tunnel syndrome on both sides   . Diabetes mellitus type 2, insulin  dependent ([place])   . Fatty liver disease, nonalcoholic   . Gout   . Hyperlipidemia   . Hypertension   . Kidney stone   . Vitamin D deficiency    Health Maintenance  Topic Date Due  . COLONOSCOPY  10/26/2015  . OPHTHALMOLOGY EXAM  02/14/2016  . FOOT EXAM  12/02/2019  . HEMOGLOBIN A1C  03/28/2020  . TETANUS/TDAP  09/18/2027  . INFLUENZA VACCINE  Completed  . PNEUMOCOCCAL POLYSACCHARIDE VACCINE AGE 18-64 HIGH RISK  Completed  . HIV Screening  Completed   Immunization History  Administered Date(s) Administered  . Influenza Inj Mdck Quad With Preservative 09/17/2017, 09/16/2018, 09/29/2019  . Influenza Split 09/05/2015  .  Influenza,inj,quad, With Preservative 10/22/2016  . Pneumococcal Polysaccharide-23 09/05/2015  . Pneumococcal-Unspecified 11/22/2009  . Td 09/17/2017  . Tdap 11/22/2006    Past Surgical History:  Procedure Laterality Date  . CYSTOSCOPY WITH RETROGRADE PYELOGRAM, URETEROSCOPY AND STENT PLACEMENT Right 02/01/2015   Procedure: CYSTOSCOPY WITH RETROGRADE PYELOGRAM, URETEROSCOPY, DIGITAL URETEROSCOPY with [place];  Surgeon: Alexis Frock, MD;  Location: [facility];  Service: Urology;  Laterality: Right;  . HOLMIUM LASER APPLICATION Right 0000000   Procedure: HOLMIUM LASER APPLICATION;  Surgeon: Alexis Frock, MD;  Location: [facility];  Service: Urology;  Laterality: Right;   Family History  Problem Relation Age of Onset  . Hypertension Mother   . Diabetes Mother   . Asthma Mother   . Diabetes Father   . Hypertension Father   . Stroke Father   . Hypertension Sister   . Hyperlipidemia Sister    Social History   Socioeconomic History  . Marital status: Divorced  . Number of children: 1 daughter 80 yo.  Occupational History  . 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 Smoking status: Former Smoker    Types: Cigars    Quit date: 04/23/2018    Years since quitting: 1.7  . Smokeless tobacco: Never Used  Substance and Sexual Activity  . Alcohol use: Yes    Comment: social  . Drug use: No  . Sexual activity: Not on file   ROS Constitutional: Denies fever, chills, weight loss/gain, headaches, insomnia,  night sweats or change in appetite. Does c/o fatigue. Eyes: Denies redness, blurred vision, diplopia, discharge, itchy or watery eyes.  ENT: Denies discharge, congestion, post nasal drip, epistaxis, sore throat, earache, hearing loss, dental pain, Tinnitus, Vertigo, Sinus pain or snoring.  Cardio: Denies chest pain, palpitations, irregular heartbeat, syncope, dyspnea, diaphoresis, orthopnea, PND, claudication or  edema Respiratory: denies cough, dyspnea, DOE, pleurisy, hoarseness, laryngitis or wheezing.  Gastrointestinal: Denies dysphagia, heartburn, reflux, water brash, pain, cramps, nausea, vomiting, bloating, diarrhea, constipation, hematemesis, melena, hematochezia, jaundice or hemorrhoids Genitourinary: Denies dysuria, frequency,discharge, hematuria or flank pain. Has urgency, nocturia x 2-3 & occasional hesitancy. Musculoskeletal: Denies arthralgia, myalgia, stiffness, Jt. Swelling, pain, limp or strain/sprain. Denies Falls. Skin: Denies puritis, rash, hives, warts, acne, eczema or change in skin lesion Neuro: No weakness, tremor, incoordination, spasms, paresthesia or pain Psychiatric: Denies confusion, memory loss or sensory loss. Denies Depression. Endocrine: Denies change in weight, skin, hair change, nocturia, and paresthesia, diabetic polys, visual blurring or hyper / hypo glycemic episodes.  Heme/Lymph: No excessive bleeding, bruising or enlarged lymph nodes.  Physical Exam  BP 118/76   Pulse (!) 56   Temp (!) 96.3 F (35.7 C)   Resp 16   Ht 6\' 2"  (1.88 m)   Wt 250 lb 3.2 oz (113.5 kg)  BMI 32.12 kg/m   General Appearance: Over nourished and well groomed and in no apparent distress.  Eyes: PERRLA, EOMs, conjunctiva no swelling or erythema, normal fundi and vessels. Sinuses: No frontal/maxillary tenderness ENT/Mouth: EACs patent / TMs  nl. Nares clear without erythema, swelling, mucoid exudates. Oral hygiene is good. No erythema, swelling, or exudate. Tongue normal, non-obstructing. Tonsils not swollen or erythematous. Hearing normal.  Neck: Supple, thyroid not palpable. No bruits, nodes or JVD. Respiratory: Respiratory effort normal.  BS equal and clear bilateral without rales, rhonci, wheezing or stridor. Cardio: Heart sounds are normal with regular rate and rhythm and no murmurs, rubs or gallops. Peripheral pulses are normal and equal bilaterally without edema. No aortic or  femoral bruits. Chest: symmetric with normal excursions and percussion.  Abdomen: Soft, rotundwith Nl bowel sounds. Nontender, no guarding, rebound, hernias, masses, or organomegaly.  Lymphatics: Non tender without lymphadenopathy.  Musculoskeletal: Full ROM all peripheral extremities, joint stability, 5/5 strength, and normal gait. Skin: Warm and dry without rashes, lesions, cyanosis, clubbing or  ecchymosis.  Neuro: Cranial nerves intact, reflexes equal bilaterally. Normal muscle tone, no cerebellar symptoms. Sensation decreased in a stocking distribution  to touch, vibratory and Monofilament to the toes bilaterally. Pysch: Alert and oriented X 3 with normal affect, insight and judgment appropriate.   Assessment and Plan  1. Essential hypertension  - COMPLETE METABOLIC PANEL WITH GFR  2. Hyperlipidemia associated with type 2 diabetes mellitus ([place])  - Lipid panel  3. Type 2 diabetes mellitus with stage 2 chronic kidney disease,  with long-term current use of insulin (HCC)  - Hemoglobin A1c  4. Vitamin D deficiency  5. Poorly controlled type 2 diabetes mellitus with peripheral neuropathy ([place])   6. Abdominal aortic atherosclerosis ([place])   7. Screening for AAA    8. Medication management  - COMPLETE METABOLIC PANEL WITH GFR - Lipid panel - Hemoglobin A1c        Patient was counseled in prudent diet, weight control to achieve/maintain BMI less than 25, BP monitoring, regular exercise and medications as discussed.  Discussed med effects and SE's. Labs arequested due to limited insurance coverage. Over 67minutes of exam, counseling, chart review and critical decision making was performed   Kirtland Bouchard, MD

## 2020-01-12 ENCOUNTER — Encounter: Payer: Self-pay | Admitting: Internal Medicine

## 2020-01-12 ENCOUNTER — Other Ambulatory Visit: Payer: Self-pay

## 2020-01-12 ENCOUNTER — Ambulatory Visit (INDEPENDENT_AMBULATORY_CARE_PROVIDER_SITE_OTHER): Payer: PRIVATE HEALTH INSURANCE | Admitting: Internal Medicine

## 2020-01-12 VITALS — BP 118/76 | HR 56 | Temp 96.3°F | Resp 16 | Ht 74.0 in | Wt 250.2 lb

## 2020-01-12 DIAGNOSIS — E1169 Type 2 diabetes mellitus with other specified complication: Secondary | ICD-10-CM | POA: Diagnosis not present

## 2020-01-12 DIAGNOSIS — E559 Vitamin D deficiency, unspecified: Secondary | ICD-10-CM | POA: Diagnosis not present

## 2020-01-12 DIAGNOSIS — E785 Hyperlipidemia, unspecified: Secondary | ICD-10-CM

## 2020-01-12 DIAGNOSIS — Z136 Encounter for screening for cardiovascular disorders: Secondary | ICD-10-CM

## 2020-01-12 DIAGNOSIS — N182 Chronic kidney disease, stage 2 (mild): Secondary | ICD-10-CM

## 2020-01-12 DIAGNOSIS — I10 Essential (primary) hypertension: Secondary | ICD-10-CM | POA: Diagnosis not present

## 2020-01-12 DIAGNOSIS — E1142 Type 2 diabetes mellitus with diabetic polyneuropathy: Secondary | ICD-10-CM

## 2020-01-12 DIAGNOSIS — Z79899 Other long term (current) drug therapy: Secondary | ICD-10-CM

## 2020-01-12 DIAGNOSIS — E1165 Type 2 diabetes mellitus with hyperglycemia: Secondary | ICD-10-CM

## 2020-01-12 DIAGNOSIS — E1122 Type 2 diabetes mellitus with diabetic chronic kidney disease: Secondary | ICD-10-CM

## 2020-01-12 DIAGNOSIS — Z794 Long term (current) use of insulin: Secondary | ICD-10-CM

## 2020-01-12 DIAGNOSIS — I700 Atherosclerosis of aorta: Secondary | ICD-10-CM

## 2020-01-13 LAB — COMPLETE METABOLIC PANEL WITH GFR
AG Ratio: 1.3 (calc) (ref 1.0–2.5)
ALT: 28 U/L (ref 9–46)
AST: 22 U/L (ref 10–35)
Albumin: 4.4 g/dL (ref 3.6–5.1)
Alkaline phosphatase (APISO): 71 U/L (ref 35–144)
BUN: 18 mg/dL (ref 7–25)
CO2: 27 mmol/L (ref 20–32)
Calcium: 10.1 mg/dL (ref 8.6–10.3)
Chloride: 99 mmol/L (ref 98–110)
Creat: 0.93 mg/dL (ref 0.70–1.33)
GFR, Est African American: 107 mL/min/{1.73_m2} (ref >=60)
GFR, Est Non African American: 93 mL/min/{1.73_m2} (ref >=60)
Globulin: 3.4 g/dL (calc) (ref 1.9–3.7)
Glucose, Bld: 195 mg/dL — ABNORMAL HIGH (ref 65–99)
Potassium: 4.7 mmol/L (ref 3.5–5.3)
Sodium: 136 mmol/L (ref 135–146)
Total Bilirubin: 0.5 mg/dL (ref 0.2–1.2)
Total Protein: 7.8 g/dL (ref 6.1–8.1)

## 2020-01-13 LAB — LIPID PANEL
Cholesterol: 115 mg/dL (ref <200)
HDL: 34 mg/dL — ABNORMAL LOW (ref >=40)
LDL Cholesterol (Calc): 65 mg/dL (calc)
Non-HDL Cholesterol (Calc): 81 mg/dL (calc) (ref <130)
Total CHOL/HDL Ratio: 3.4 (calc) (ref <5.0)
Triglycerides: 80 mg/dL (ref <150)

## 2020-01-13 LAB — HEMOGLOBIN A1C
Hgb A1c MFr Bld: 11.6 % of total Hgb — ABNORMAL HIGH (ref <5.7)
Mean Plasma Glucose: 286 (calc)
eAG (mmol/L): 15.9 (calc)

## 2020-02-04 ENCOUNTER — Encounter: Payer: Self-pay | Admitting: Internal Medicine

## 2020-02-14 ENCOUNTER — Other Ambulatory Visit: Payer: Self-pay | Admitting: Internal Medicine

## 2020-03-11 ENCOUNTER — Other Ambulatory Visit: Payer: Self-pay | Admitting: Internal Medicine

## 2020-03-11 DIAGNOSIS — E559 Vitamin D deficiency, unspecified: Secondary | ICD-10-CM

## 2020-04-16 ENCOUNTER — Other Ambulatory Visit: Payer: Self-pay | Admitting: Physician Assistant

## 2020-04-25 ENCOUNTER — Encounter: Payer: Self-pay | Admitting: Internal Medicine

## 2020-04-25 NOTE — Progress Notes (Signed)
History of Present Illness:       This very nice 54 y.o. MBM  presents for 3 month follow up with HTN, HLD,   and Vitamin D Deficiency. Patient's Gout is controlled on his meds.       Patient is treated for HTN (2007) & BP has been controlled at home. Today's BP is at goal -  122/68. Patient has had no complaints of any cardiac type chest pain, palpitations, dyspnea / orthopnea / PND, dizziness, claudication, or dependent edema.      Hyperlipidemia is controlled with diet & Atorvastatin / fenofibrate. Patient denies myalgias or other med SE's. Last Lipids were at goal:  Lab Results  Component Value Date   CHOL 115 01/12/2020   HDL 34 (L) 01/12/2020   LDLCALC 65 01/12/2020   TRIG 80 01/12/2020   CHOLHDL 3.4 01/12/2020    Also, the patient has history of  Insulin Dependent T2_DM currently on Levemir & MF and has had no symptoms of reactive hypoglycemia, diabetic polys, paresthesias or visual blurring.   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 He also admits not monitoring CBG's:  Lab Results  Component Value Date   HGBA1C 11.6 (H) 01/12/2020           Further, the patient also has history of Vitamin D Deficiency and does NOT supplement vitamin D as repeatedly advised. Last vitamin D was very low:  Lab Results  Component Value Date   VD25OH 25 (L) 09/29/2019    Current Outpatient Medications on File Prior to Visit  Medication Sig  . allopurinol (ZYLOPRIM) 300 MG tablet TAKE 1 TABLET BY MOUTH EVERY DAY  . aspirin 81 MG tablet Take 81 mg by mouth daily.  Marland Kitchen atenolol (TENORMIN) 50 MG tablet Take 1 tablet Daily for BP  . benazepril (LOTENSIN) 20 MG tablet Take 1 tablet Daily for BP & Diabetic Kidney Protection  . bisoprolol-hydrochlorothiazide (ZIAC) 5-6.25 MG tablet Take 1 tablet Daily for BP  . Blood Glucose Monitoring Suppl (FREESTYLE LITE) DEVI 1 device to check sugars, patient on insulin  . Cholecalciferol  (VITAMIN D3) 1.25 MG (50000 UT) CAPS TAKE 1 CAPSULE   3 x / WEEK  . fenofibrate micronized (LOFIBRA) 200 MG capsule Take 1 capsule (200 mg total) by mouth daily before breakfast.  . glucose blood (FREESTYLE LITE) test strip Test sugar 3 x daily due to hyperglycemia and insulin use. E11.22  . insulin detemir (LEVEMIR) 100 UNIT/ML injection Increase to 15 units subcutaneously daily, then if fasting sugars still 150+ in 2 weeks increase to 18 units.  . Magnesium 500 MG TABS Take by mouth daily.  . metFORMIN (GLUCOPHAGE-XR) 500 MG 24 hr tablet Take 2 tablets 2 x/ day for Diabetes  . Multiple Vitamin (MULTIVITAMIN WITH MINERALS) TABS tablet Take 1 tablet by mouth daily.  . phentermine (ADIPEX-P) 37.5 MG tablet TAKE 1 TABLET EVERY MORNING FOR DIETING & WEIGHT LOSS  . topiramate (TOPAMAX) 50 MG tablet Take 1/2 to 1 tablet 2 x / day at Suppertime & Bedtime for Dieting & Weight Loss  . vitamin B-12 (CYANOCOBALAMIN) 100 MCG tablet Take 100 mcg by mouth daily.  Marland Kitchen atorvastatin (LIPITOR) 10 MG tablet Take 1 tablet (10 mg total) by mouth daily.   No current facility-administered medications on file prior to visit.    Allergies  Allergen Reactions  . Ppd [Tuberculin Purified Protein Derivative]     + PPD  2010  Roanna Banning Hcl]     Chest pain    PMHx:   Past Medical History:  Diagnosis Date  . Carpal tunnel syndrome on both sides   . Diabetes mellitus type 2, insulin dependent ([place])   . Fatty liver disease, nonalcoholic   . Gout   . Hyperlipidemia   . Hypertension   . Kidney stone   . Vitamin D deficiency     Immunization History  Administered Date(s) Administered  . Influenza Inj Mdck Quad With Preservative 09/17/2017, 09/16/2018, 09/29/2019  . Influenza Split 09/05/2015  . Influenza,inj,quad, With Preservative 10/22/2016  . Moderna SARS-COVID-2 Vaccination 12/01/2019, 12/29/2019  . Pneumococcal Polysaccharide-23 09/05/2015  . Pneumococcal-Unspecified 11/22/2009  . Td  09/17/2017  . Tdap 11/22/2006    Past Surgical History:  Procedure Laterality Date  . CYSTOSCOPY WITH RETROGRADE PYELOGRAM, URETEROSCOPY AND STENT PLACEMENT Right 02/01/2015   Procedure: CYSTOSCOPY WITH RETROGRADE PYELOGRAM, URETEROSCOPY, DIGITAL URETEROSCOPY with [place];  Surgeon: Alexis Frock, MD;  Location: [facility];  Service: Urology;  Laterality: Right;  . HOLMIUM LASER APPLICATION Right 6/60/6301   Procedure: HOLMIUM LASER APPLICATION;  Surgeon: Alexis Frock, MD;  Location: [facility];  Service: Urology;  Laterality: Right;    FHx:    Reviewed / unchanged  SHx:    Reviewed / unchanged   Systems Review:  Constitutional: Denies fever, chills, wt changes, headaches, insomnia, fatigue, night sweats, change in appetite. Eyes: Denies redness, blurred vision, diplopia, discharge, itchy, watery eyes.  ENT: Denies discharge, congestion, post nasal drip, epistaxis, sore throat, earache, hearing loss, dental pain, tinnitus, vertigo, sinus pain, snoring.  CV: Denies chest pain, palpitations, irregular heartbeat, syncope, dyspnea, diaphoresis, orthopnea, PND, claudication or edema. Respiratory: denies cough, dyspnea, DOE, pleurisy, hoarseness, laryngitis, wheezing.  Gastrointestinal: Denies dysphagia, odynophagia, heartburn, reflux, water brash, abdominal pain or cramps, nausea, vomiting, bloating, diarrhea, constipation, hematemesis, melena, hematochezia  or hemorrhoids. Genitourinary: Denies dysuria, frequency, urgency, nocturia, hesitancy, discharge, hematuria or flank pain. Musculoskeletal: Denies arthralgias, myalgias, stiffness, jt. swelling, pain, limping or strain/sprain.  Skin: Denies pruritus, rash, hives, warts, acne, eczema or change in skin lesion(s). Neuro: No weakness, tremor, incoordination, spasms, paresthesia or pain. Psychiatric: Denies confusion, memory loss or sensory loss. Endo: Denies change in weight, skin  or hair change.  Heme/Lymph: No excessive bleeding, bruising or enlarged lymph nodes.  Physical Exam  BP 122/68   Pulse 72   Temp 97.6 F (36.4 C)   Resp 16   Ht 6\' 2"  (1.88 m)   Wt 256 lb 12.8 oz (116.5 kg)   BMI 32.97 kg/m   Appears  well nourished, well groomed  and in no distress.  Eyes: PERRLA, EOMs, conjunctiva no swelling or erythema. Sinuses: No frontal/maxillary tenderness ENT/Mouth: EAC's clear, TM's nl w/o erythema, bulging. Nares clear w/o erythema, swelling, exudates. Oropharynx clear without erythema or exudates. Oral hygiene is good. Tongue normal, non obstructing. Hearing intact.  Neck: Supple. Thyroid not palpable. Car 2+/2+ without bruits, nodes or JVD. Chest: Respirations nl with BS clear & equal w/o rales, rhonchi, wheezing or stridor.  Cor: Heart sounds normal w/ regular rate and rhythm without sig. murmurs, gallops, clicks or rubs. Peripheral pulses normal and equal  without edema.  Abdomen: Soft & bowel sounds normal. Non-tender w/o guarding, rebound, hernias, masses or organomegaly.  Lymphatics: Unremarkable.  Musculoskeletal: Full ROM all peripheral extremities, joint stability, 5/5 strength and normal gait.  Skin: Warm, dry without exposed rashes, lesions or ecchymosis apparent.  Neuro: Cranial nerves intact, reflexes equal bilaterally. Sensory-motor testing grossly intact. Tendon reflexes grossly intact.  Pysch: Alert & oriented x 3.  Insight and judgement nl & appropriate. No ideations.  Assessment and Plan:  1. Essential hypertension  - Continue medication, monitor blood pressure at home.  - Continue DASH diet.  Reminder to go to the ER if any CP,  SOB, nausea, dizziness, severe HA, changes vision/speech.  - COMPLETE METABOLIC PANEL WITH GFR  2. Hyperlipidemia associated with type 2 diabetes mellitus ([place])  - Continue diet/meds, exercise,& lifestyle modifications.  - Continue monitor periodic cholesterol/liver & renal functions   - Lipid  panel  3. Poorly controlled type 2 diabetes mellitus with peripheral neuropathy (HCC)  - Continue diet, exercise  - Lifestyle modifications.  - Monitor appropriate labs.  - Hemoglobin A1c  4. Vitamin D deficiency  - Continue supplementation.  5. Type 2 diabetes mellitus with stage 1 chronic kidney disease,  with long-term current use of insulin (HCC)  - Hemoglobin A1c  6. Idiopathic gout  - Uric acid  7. Medication management  - COMPLETE METABOLIC PANEL WITH GFR - Lipid panel - Hemoglobin A1c - Uric acid      Discussed  regular exercise, BP monitoring, weight control to achieve/maintain BMI less than 25 and discussed med and SE's.       Labs limited by patient request due to lack of  adequate insurance coverage.  I discussed the assessment and treatment plan with the patient.       The patient was provided an opportunity to ask questions and all were answered. The patient agreed with the plan and demonstrated an understanding of the instructions.  I provided over 30 minutes of exam, counseling, chart review and  complex critical decision making.         The patient was advised to call back or seek an in-person evaluation if the symptoms worsen or if the condition fails to improve as anticipated.   Kirtland Bouchard, MD

## 2020-04-25 NOTE — Patient Instructions (Signed)

## 2020-04-26 ENCOUNTER — Other Ambulatory Visit: Payer: Self-pay

## 2020-04-26 ENCOUNTER — Ambulatory Visit (INDEPENDENT_AMBULATORY_CARE_PROVIDER_SITE_OTHER): Payer: PRIVATE HEALTH INSURANCE | Admitting: Internal Medicine

## 2020-04-26 VITALS — BP 122/68 | HR 72 | Temp 97.6°F | Resp 16 | Ht 74.0 in | Wt 256.8 lb

## 2020-04-26 DIAGNOSIS — E1122 Type 2 diabetes mellitus with diabetic chronic kidney disease: Secondary | ICD-10-CM | POA: Diagnosis not present

## 2020-04-26 DIAGNOSIS — M1000 Idiopathic gout, unspecified site: Secondary | ICD-10-CM | POA: Diagnosis not present

## 2020-04-26 DIAGNOSIS — Z794 Long term (current) use of insulin: Secondary | ICD-10-CM

## 2020-04-26 DIAGNOSIS — I10 Essential (primary) hypertension: Secondary | ICD-10-CM

## 2020-04-26 DIAGNOSIS — E785 Hyperlipidemia, unspecified: Secondary | ICD-10-CM

## 2020-04-26 DIAGNOSIS — E1165 Type 2 diabetes mellitus with hyperglycemia: Secondary | ICD-10-CM

## 2020-04-26 DIAGNOSIS — E1169 Type 2 diabetes mellitus with other specified complication: Secondary | ICD-10-CM | POA: Diagnosis not present

## 2020-04-26 DIAGNOSIS — E1142 Type 2 diabetes mellitus with diabetic polyneuropathy: Secondary | ICD-10-CM

## 2020-04-26 DIAGNOSIS — Z79899 Other long term (current) drug therapy: Secondary | ICD-10-CM

## 2020-04-26 DIAGNOSIS — E559 Vitamin D deficiency, unspecified: Secondary | ICD-10-CM | POA: Diagnosis not present

## 2020-04-26 DIAGNOSIS — N181 Chronic kidney disease, stage 1: Secondary | ICD-10-CM

## 2020-04-27 LAB — LIPID PANEL
Cholesterol: 127 mg/dL (ref <200)
HDL: 31 mg/dL — ABNORMAL LOW (ref >=40)
Non-HDL Cholesterol (Calc): 96 mg/dL (calc) (ref <130)
Total CHOL/HDL Ratio: 4.1 (calc) (ref <5.0)
Triglycerides: 430 mg/dL — ABNORMAL HIGH (ref <150)

## 2020-04-27 LAB — COMPLETE METABOLIC PANEL WITH GFR
AG Ratio: 1.3 (calc) (ref 1.0–2.5)
ALT: 26 U/L (ref 9–46)
AST: 19 U/L (ref 10–35)
Albumin: 4.4 g/dL (ref 3.6–5.1)
Alkaline phosphatase (APISO): 125 U/L (ref 35–144)
BUN: 21 mg/dL (ref 7–25)
CO2: 28 mmol/L (ref 20–32)
Calcium: 9.8 mg/dL (ref 8.6–10.3)
Chloride: 101 mmol/L (ref 98–110)
Creat: 0.89 mg/dL (ref 0.70–1.33)
GFR, Est African American: 112 mL/min/{1.73_m2} (ref >=60)
GFR, Est Non African American: 97 mL/min/{1.73_m2} (ref >=60)
Globulin: 3.3 g/dL (calc) (ref 1.9–3.7)
Glucose, Bld: 301 mg/dL — ABNORMAL HIGH (ref 65–99)
Potassium: 4.9 mmol/L (ref 3.5–5.3)
Sodium: 139 mmol/L (ref 135–146)
Total Bilirubin: 0.3 mg/dL (ref 0.2–1.2)
Total Protein: 7.7 g/dL (ref 6.1–8.1)

## 2020-04-27 LAB — URIC ACID: Uric Acid, Serum: 6.3 mg/dL (ref 4.0–8.0)

## 2020-04-27 LAB — HEMOGLOBIN A1C
Hgb A1c MFr Bld: 9.3 % of total Hgb — ABNORMAL HIGH (ref <5.7)
Mean Plasma Glucose: 220 (calc)
eAG (mmol/L): 12.2 (calc)

## 2020-04-27 NOTE — Progress Notes (Signed)
==============================================================  -    Random Glucose = 301 mg%  and   -  A1c = 9.3 % -> average glucose of  220 mg% ==============================================================  -  Uric Acid / Gout test OK  ==============================================================  -  All Else - CBC - Kidneys - Electrolytes -  Liver - all  Normal / OK ==============================================================   Being diabetic has a  300% increased risk for heart attack,  stroke, cancer, and alzheimer- type vascular dementia.   It is very important that you work harder with diet by  avoiding all foods that are white except chicken,   fish & calliflower.  - Avoid white rice  (brown & wild rice is OK),   - Avoid white potatoes  (sweet potatoes in moderation is OK),   White bread or wheat bread or anything made out of   white flour like bagels, donuts, rolls, buns, biscuits, cakes,  - pastries, cookies, pizza crust, and pasta (made from  white flour & egg whites)   - vegetarian pasta or spinach or wheat pasta is OK.  - Multigrain breads like Arnold's, Pepperidge Farm or   multigrain sandwich thins or high fiber breads like   [place] bread or "Dave's Killer" breads that are  4 to 5 grams fiber per slice !  are best.    Diet, exercise and weight loss can reverse and cure  diabetes in the early stages.    - Diet, exercise and weight loss is very important in the   control and prevention of complications of diabetes which  affects every system in your body, ie.   -Brain - dementia/stroke,  - eyes - glaucoma/blindness,  - heart - heart attack/heart failure,  - kidneys - dialysis,  - stomach - gastric paralysis,  - intestines - malabsorption,  - nerves - severe painful neuritis,  - circulation - gangrene & loss of a leg(s)  - and finally  . . . . . . . . . . . . . . . . . .    - cancer and  Alzheimers. ==============================================================

## 2020-06-10 ENCOUNTER — Other Ambulatory Visit: Payer: Self-pay | Admitting: Internal Medicine

## 2020-06-10 DIAGNOSIS — E559 Vitamin D deficiency, unspecified: Secondary | ICD-10-CM

## 2020-06-29 ENCOUNTER — Other Ambulatory Visit: Payer: Self-pay | Admitting: Internal Medicine

## 2020-07-12 ENCOUNTER — Other Ambulatory Visit: Payer: Self-pay | Admitting: Internal Medicine

## 2020-08-15 NOTE — Progress Notes (Signed)
3 Month Follow Up   Assessment and Plan:    Essential hypertension Continue current medications Monitor blood pressure at home; call if consistently over 130/80 Continue DASH diet.   Reminder to go to the ER if any CP, SOB, nausea, dizziness, severe HA, changes vision/speech, left arm numbness and tingling and jaw pain. -     CBC with Diff -     COMPLETE METABOLIC PANEL WITH GFR -     Magnesium  Hyperlipidemia associated with type 2 diabetes mellitus (HCC) Continue medications- pending labs may increase lipitor to 20 mg- needs to STOP soda and better control over sugars Discussed dietary and exercise modifications Low fat diet -     Lipid Profile  Type 2 DM with CKD stage 2 and hypertension ([place]) Discussed general issues about diabetes pathophysiology and management. Education: Reviewed ABCs of diabetes management (respective goals in parentheses):  A1C (<7), blood pressure (<130/80), and cholesterol (LDL <70) Dietary recommendations WILL ADD ON GLP-1 FOR WEIGHT AND GLUCOSE CONTROL Encouraged aerobic exercise.  Discussed foot care, check daily Yearly retinal exam Dental exam every 6 months Monitor blood glucose, discussed goal for patient -     Hemoglobin A1c ([facility])  CKD stage 2 due to type 2 diabetes mellitus (HCC) Continue glucose monitoring Continue medications Increase fluids Avoid NSAIDS Discussed dietary modifications and exercise Will continue to monitor  Poorly controlled type 2 diabetes mellitus with peripheral neuropathy (HCC) See above Checks feet daily  Vitamin D deficiency Continue supplementation Taking Vitamin D 4,000 IU daily -     Vitamin D (25 hydroxy)  Obesity (BMI 30.0-34.9) Discussed dietary and exercise modifications  Medication management Continued   Continue diet and meds as discussed. Further disposition pending results of labs. Discussed med's effects and SE's.  Patient agrees with plan of care and opportunity to ask  questions/voice concerns. Over 30 minutes of chart review, interview, exam, counseling, and critical decision making was performed.   Future Appointments  Date Time Provider [place]  01/19/2021  9:00 AM Unk Pinto, MD [facility] None    ----------------------------------------------------------------------------------------------------------------------  HPI 54 y.o. male  presents for 3 month follow up on HTN, HLD, DMII, gout, weight and vitamin D deficiency.   BMI is Body mass index is 33 kg/m., he has not been working on diet and exercise. Wt Readings from Last 3 Encounters:  08/16/20 257 lb (116.6 kg)  04/26/20 256 lb 12.8 oz (116.5 kg)  01/12/20 250 lb 3.2 oz (113.5 kg)    His blood pressure has not been controlled at home, today their BP is BP: 126/84  He does not workout. He denies any cardiac symptoms, chest pains, palpitations, shortness of breath, dizziness or lower extremity edema.     He is on cholesterol medication Atorvastatin and Fenofibrate and denies myalgias.   His cholesterol is not at goal of less than 70. The cholesterol last visit was:   Lab Results  Component Value Date   CHOL 127 04/26/2020   HDL 31 (L) 04/26/2020   [place]  04/26/2020     Comment:     . LDL cholesterol not calculated. Triglyceride levels greater than 400 mg/dL invalidate calculated LDL results. . Reference range: <100 . Desirable range <100 mg/dL for primary prevention;   <70 mg/dL for patients with CHD or diabetic patients  with > or = 2 CHD risk factors. Marland Kitchen LDL-C is now calculated using the Martin-Hopkins  calculation, which is a validated novel method providing  better accuracy than the Friedewald equation  in the  estimation of LDL-C.  [name] et al. Annamaria Helling. 4268;341(96): 2061-2068  (http://education.[employer].com/faq/FAQ164)    TRIG 430 (H) 04/26/2020   CHOLHDL 4.1 04/26/2020    He has not been working on diet and exercise for DMII  Normal kidney  function Hyperlipidemia With neuropathy He was on trulicity but it was too expensive- will be changing insurance next months so will try to get him back on it.  He is taking Levemir 28 units daily every AM and he increased this three weeks ago. He is also taking Meformin XR 500mg  two tablets twice a day.  Meter Freestyle- wants to get continuous monitoring.  Eye Exam 07/2019 and denies polydipsia, polyuria, visual disturbances, vomiting and weight loss.     Last A1C in the office was:  Lab Results  Component Value Date   HGBA1C 9.3 (H) 04/26/2020   Lab Results  Component Value Date   GFRAA 112 04/26/2020   Patient is on Vitamin D supplement for deficiency and taking 4,000IU daily.   Lab Results  Component Value Date   VD25OH 25 (L) 09/29/2019       Current Medications:   Current Outpatient Medications (Endocrine & Metabolic):    insulin detemir (LEVEMIR) 100 UNIT/ML injection, Increase to 15 units subcutaneously daily, then if fasting sugars still 150+ in 2 weeks increase to 18 units.   metFORMIN (GLUCOPHAGE-XR) 500 MG 24 hr tablet, Take 2 tablets 2 x/ day for Diabetes  Current Outpatient Medications (Cardiovascular):    atenolol (TENORMIN) 50 MG tablet, Take 1 tablet Daily for BP   benazepril (LOTENSIN) 20 MG tablet, Take 1 tablet Daily for BP & Diabetic Kidney Protection   bisoprolol-hydrochlorothiazide (ZIAC) 5-6.25 MG tablet, TAKE 1 TABLET DAILY FOR BLOOD PRESSURE   fenofibrate micronized (LOFIBRA) 200 MG capsule, Take 1 capsule (200 mg total) by mouth daily before breakfast.   atorvastatin (LIPITOR) 10 MG tablet, Take 1 tablet (10 mg total) by mouth daily.   Current Outpatient Medications (Analgesics):    allopurinol (ZYLOPRIM) 300 MG tablet, TAKE 1 TABLET BY MOUTH EVERY DAY   aspirin 81 MG tablet, Take 81 mg by mouth daily.  Current Outpatient Medications (Hematological):    vitamin B-12 (CYANOCOBALAMIN) 100 MCG tablet, Take 100 mcg by mouth  daily.  Current Outpatient Medications (Other):    Blood Glucose Monitoring Suppl (FREESTYLE LITE) DEVI, 1 device to check sugars, patient on insulin   Cholecalciferol (VITAMIN D3) 1.25 MG (50000 UT) CAPS, TAKE 1 CAPSULE 3 X / WEEK   glucose blood (FREESTYLE LITE) test strip, Test sugar 3 x daily due to hyperglycemia and insulin use. E11.22   Magnesium 500 MG TABS, Take by mouth daily.   Multiple Vitamin (MULTIVITAMIN WITH MINERALS) TABS tablet, Take 1 tablet by mouth daily.   phentermine (ADIPEX-P) 37.5 MG tablet, TAKE 1 TABLET EVERY MORNING FOR DIETING & WEIGHT LOSS   topiramate (TOPAMAX) 50 MG tablet, Take 1/2 to 1 tablet 2 x / day at Suppertime & Bedtime for Dieting & Weight Loss  Allergies:  Allergies  Allergen Reactions   Ppd [Tuberculin Purified Protein Derivative]     + PPD 2010   Welchol [Colesevelam Hcl]     Chest pain     Medical History:  Past Medical History:  Diagnosis Date   Carpal tunnel syndrome on both sides    Diabetes mellitus type 2, insulin dependent ([place])    Fatty liver disease, nonalcoholic    Gout    Hyperlipidemia    Hypertension  Kidney stone    Vitamin D deficiency     Family history- Reviewed and unchanged Social history- Reviewed and unchanged   Review of Systems:  Review of Systems  Constitutional: Negative for chills, diaphoresis, fever, malaise/fatigue and weight loss.  HENT: Negative for congestion, ear discharge, ear pain, hearing loss, nosebleeds, sinus pain, sore throat and tinnitus.   Eyes: Negative for blurred vision, double vision, photophobia, pain, discharge and redness.  Respiratory: Negative for cough, hemoptysis, sputum production, shortness of breath, wheezing and stridor.   Cardiovascular: Negative for chest pain, palpitations, orthopnea, claudication, leg swelling and PND.  Gastrointestinal: Negative for abdominal pain, blood in stool, constipation, diarrhea, heartburn, melena, nausea and vomiting.   Genitourinary: Negative for dysuria, flank pain, frequency, hematuria and urgency.  Musculoskeletal: Negative for back pain, falls, joint pain, myalgias and neck pain.  Skin: Negative for itching and rash.  Neurological: Negative for dizziness, tingling, tremors, sensory change, speech change, focal weakness, seizures, loss of consciousness, weakness and headaches.  Endo/Heme/Allergies: Negative for environmental allergies and polydipsia. Does not bruise/bleed easily.  Psychiatric/Behavioral: Negative for depression, hallucinations, memory loss, substance abuse and suicidal ideas. The patient is not nervous/anxious and does not have insomnia.       Physical Exam: BP 126/84    Pulse 70    Temp (!) 97.5 F (36.4 C)    Wt 257 lb (116.6 kg)    SpO2 98%    BMI 33.00 kg/m  Wt Readings from Last 3 Encounters:  08/16/20 257 lb (116.6 kg)  04/26/20 256 lb 12.8 oz (116.5 kg)  01/12/20 250 lb 3.2 oz (113.5 kg)   General Appearance: Well nourished, in no apparent distress. Eyes: PERRLA, EOMs, conjunctiva no swelling or erythema ENT/Mouth: Ext aud canals clear, TMs without erythema, bulging. Hearing normal.  Neck: Supple, thyroid normal.  Respiratory: Respiratory effort normal, BS equal bilaterally without rales, rhonchi, wheezing or stridor.  Cardio: RRR with no MRGs. Brisk peripheral pulses without edema.  Abdomen: Soft, + BS.  Non tender, no guarding, rebound, hernias, masses. Lymphatics: Non tender without lymphadenopathy.  Musculoskeletal: Full ROM, 5/5 strength, Normal gait Skin: Warm, dry without rashes, lesions, ecchymosis.  Neuro: Cranial nerves intact. No cerebellar symptoms.  Psych: Awake and oriented X 3, normal affect, Insight and Judgment appropriate.    Vicie Mutters, PA-C [facility] 9:50 AM

## 2020-08-16 ENCOUNTER — Encounter: Payer: Self-pay | Admitting: Physician Assistant

## 2020-08-16 ENCOUNTER — Ambulatory Visit (INDEPENDENT_AMBULATORY_CARE_PROVIDER_SITE_OTHER): Payer: PRIVATE HEALTH INSURANCE | Admitting: Physician Assistant

## 2020-08-16 ENCOUNTER — Other Ambulatory Visit: Payer: Self-pay

## 2020-08-16 VITALS — BP 126/84 | HR 70 | Temp 97.5°F | Wt 257.0 lb

## 2020-08-16 DIAGNOSIS — I10 Essential (primary) hypertension: Secondary | ICD-10-CM

## 2020-08-16 DIAGNOSIS — L97521 Non-pressure chronic ulcer of other part of left foot limited to breakdown of skin: Secondary | ICD-10-CM

## 2020-08-16 DIAGNOSIS — E785 Hyperlipidemia, unspecified: Secondary | ICD-10-CM

## 2020-08-16 DIAGNOSIS — E1142 Type 2 diabetes mellitus with diabetic polyneuropathy: Secondary | ICD-10-CM

## 2020-08-16 DIAGNOSIS — Z23 Encounter for immunization: Secondary | ICD-10-CM

## 2020-08-16 DIAGNOSIS — F17200 Nicotine dependence, unspecified, uncomplicated: Secondary | ICD-10-CM

## 2020-08-16 DIAGNOSIS — E1122 Type 2 diabetes mellitus with diabetic chronic kidney disease: Secondary | ICD-10-CM | POA: Diagnosis not present

## 2020-08-16 DIAGNOSIS — E1165 Type 2 diabetes mellitus with hyperglycemia: Secondary | ICD-10-CM

## 2020-08-16 DIAGNOSIS — E1169 Type 2 diabetes mellitus with other specified complication: Secondary | ICD-10-CM | POA: Diagnosis not present

## 2020-08-16 DIAGNOSIS — E08621 Diabetes mellitus due to underlying condition with foot ulcer: Secondary | ICD-10-CM | POA: Diagnosis not present

## 2020-08-16 DIAGNOSIS — N182 Chronic kidney disease, stage 2 (mild): Secondary | ICD-10-CM

## 2020-08-16 MED ORDER — FREESTYLE LIBRE 14 DAY SENSOR MISC: 1.0000 "application " | 4 refills | Status: DC

## 2020-08-16 MED ORDER — FREESTYLE LIBRE 14 DAY READER DEVI: 1.0000 | 4 refills | Status: DC

## 2020-08-16 MED ORDER — TRULICITY 0.75 MG/0.5ML ~~LOC~~ SOAJ: 0.7500 mg | SUBCUTANEOUS | 0 refills | Status: DC

## 2020-08-16 NOTE — Patient Instructions (Addendum)
I want to add on a GLP once a week injectable to help with weight loss and cut back on the levemir.   Please get 1 month free to trulicity 0.75mg .  And then we can either send in the ozempic 0.5mg  once a week pen or the trulcity 1.5 mg pen- [facility] [place].   The ozempic may have more weight loss with it  Will send in continuous glucose monitor to see if insurance will cover it for you being on insulin.   Remember the levemir just affects your MORNING/FASTING sugar- so adjust it based off that.   INCREASE YOUR LIPITOR TO 20 MG DAILY FOR CHOLESTEROL CONTROL  Can do a steroid nasal spary 1-2 sparys at night each nostril.  Examples are nasonex, flonase, nasocort- they are over the counter.  Remember to spray each nostril twice towards the outer part of your eye.   Do not sniff but instead pinch your nose and tilt your head back to help the medicine get into your sinuses.   The best time to do this is at bedtime.  Stop if you get blurred vision or nose bleeds.   THIS WILL TAKE 7 DAYS TO WORK AND IS BETTER IF YOU START BEFORE SYMPTOMS SO IF YOU HAVE A SEASON OR TIME OF THE YEAR YOU ALWAYS GET A COLD, START BEFORE THAT!  INFORMATION ABOUT YOUR STEROID INHALER  Can do steroid inhaler, NEED TO DO DAILY, this is NOT a rescue inhaler so if you are acutely short of breath please use your albuterol or call 911.  Do 1 puff ONCE a day.  Do before you brush your teeth OR wash your mouth afterwards.  IF YOU DO NOT [place] YOUR MOUTH OUT IT CAN CAUSE YEAST Can do 2 tsp vinegar with water and switch to help prevent yeast or help yeast in your mouth.   Go to the ER if any chest pain, shortness of breath, nausea, dizziness, severe HA, changes vision/speech  General eating tips  What to Avoid . Avoid added sugars o Often added sugar can be found in processed foods such as many condiments, dry cereals, cakes, cookies, chips, crisps, crackers, candies, sweetened drinks, etc.  o Read  labels and AVOID/DECREASE use of foods with the following in their ingredient list: Sugar, fructose, high fructose corn syrup, sucrose, glucose, maltose, dextrose, molasses, cane sugar, brown sugar, any type of syrup, agave nectar, etc.   . Avoid snacking in between meals- drink water or if you feel you need a snack, pick a high water content snack such as cucumbers, watermelon, or any veggie.  Marland Kitchen Avoid foods made with flour o If you are going to eat food made with flour, choose those made with whole-grains; and, minimize your consumption as much as is tolerable . Avoid processed foods o These foods are generally stocked in the middle of the grocery store.  o Focus on shopping on the perimeter of the grocery.  What to Include . Vegetables o GREEN LEAFY VEGETABLES: Kale, spinach, mustard greens, collard greens, cabbage, broccoli, etc. o OTHER: Asparagus, cauliflower, eggplant, carrots, peas, Brussel sprouts, tomatoes, bell peppers, zucchini, beets, cucumbers, etc. . Grains, seeds, and legumes o Beans: kidney beans, black eyed peas, garbanzo beans, black beans, pinto beans, etc. o Whole, unrefined grains: brown rice, barley, bulgur, oatmeal, etc. . Healthy fats  o Avoid highly processed fats such as vegetable oil o Examples of healthy fats: avocado, olives, virgin olive oil, dark chocolate (?72% Cocoa), nuts (peanuts,  almonds, walnuts, cashews, pecans, etc.) o Please still do small amount of these healthy fats, they are dense in calories.  . Low - Moderate Intake of Animal Sources of Protein o Meat sources: chicken, [place], salmon, tuna. Limit to 4 ounces of meat at one time or the size of your palm. o Consider limiting dairy sources, but when choosing dairy focus on: PLAIN [place] yogurt, cottage cheese, high-protein milk . Fruit o Choose berries

## 2020-08-17 LAB — CBC WITH DIFFERENTIAL/PLATELET
Absolute Monocytes: 460 cells/uL (ref 200–950)
Basophils Absolute: 89 cells/uL (ref 0–200)
Basophils Relative: 1.5 %
Eosinophils Absolute: 313 cells/uL (ref 15–500)
Eosinophils Relative: 5.3 %
HCT: 40.5 % (ref 38.5–50.0)
Hemoglobin: 13.8 g/dL (ref 13.2–17.1)
Lymphs Abs: 2277 cells/uL (ref 850–3900)
MCH: 30.9 pg (ref 27.0–33.0)
MCHC: 34.1 g/dL (ref 32.0–36.0)
MCV: 90.6 fL (ref 80.0–100.0)
MPV: 11.6 fL (ref 7.5–12.5)
Monocytes Relative: 7.8 %
Neutro Abs: 2761 cells/uL (ref 1500–7800)
Neutrophils Relative %: 46.8 %
Platelets: 237 10*3/uL (ref 140–400)
RBC: 4.47 10*6/uL (ref 4.20–5.80)
RDW: 12.6 % (ref 11.0–15.0)
Total Lymphocyte: 38.6 %
WBC: 5.9 10*3/uL (ref 3.8–10.8)

## 2020-08-17 LAB — COMPLETE METABOLIC PANEL WITH GFR
AG Ratio: 1.2 (calc) (ref 1.0–2.5)
ALT: 27 U/L (ref 9–46)
AST: 17 U/L (ref 10–35)
Albumin: 4.2 g/dL (ref 3.6–5.1)
Alkaline phosphatase (APISO): 153 U/L — ABNORMAL HIGH (ref 35–144)
BUN: 13 mg/dL (ref 7–25)
CO2: 27 mmol/L (ref 20–32)
Calcium: 9.7 mg/dL (ref 8.6–10.3)
Chloride: 99 mmol/L (ref 98–110)
Creat: 0.76 mg/dL (ref 0.70–1.33)
GFR, Est African American: 120 mL/min/{1.73_m2} (ref >=60)
GFR, Est Non African American: 103 mL/min/{1.73_m2} (ref >=60)
Globulin: 3.5 g/dL (calc) (ref 1.9–3.7)
Glucose, Bld: 301 mg/dL — ABNORMAL HIGH (ref 65–99)
Potassium: 4.3 mmol/L (ref 3.5–5.3)
Sodium: 135 mmol/L (ref 135–146)
Total Bilirubin: 0.4 mg/dL (ref 0.2–1.2)
Total Protein: 7.7 g/dL (ref 6.1–8.1)

## 2020-08-17 LAB — HEMOGLOBIN A1C
Hgb A1c MFr Bld: 13.2 % of total Hgb — ABNORMAL HIGH (ref <5.7)
Mean Plasma Glucose: 332 (calc)
eAG (mmol/L): 18.4 (calc)

## 2020-08-17 LAB — LIPID PANEL
Cholesterol: 127 mg/dL (ref <200)
HDL: 32 mg/dL — ABNORMAL LOW (ref >=40)
LDL Cholesterol (Calc): 62 mg/dL (calc)
Non-HDL Cholesterol (Calc): 95 mg/dL (calc) (ref <130)
Total CHOL/HDL Ratio: 4 (calc) (ref <5.0)
Triglycerides: 314 mg/dL — ABNORMAL HIGH (ref <150)

## 2020-08-17 LAB — TSH: TSH: 2.59 mIU/L (ref 0.40–4.50)

## 2020-11-21 NOTE — Progress Notes (Unsigned)
FOLLOW UP 3 MONTH   Assessment and Plan:   Essential hypertension Continue current medications Monitor blood pressure at home; call if consistently over 130/80 Continue DASH diet.   Reminder to go to the ER if any CP, SOB, nausea, dizziness, severe HA, changes vision/speech, left arm numbness and tingling and jaw pain. -     CBC with Diff -     COMPLETE METABOLIC PANEL WITH GFR -     Magnesium  Hyperlipidemia associated with type 2 diabetes mellitus (HCC) Continue medications- pending labs may increase lipitor to 20 mg- needs to STOP soda and better control over sugars Discussed dietary and exercise modifications Low fat diet -     Lipid Profile  Type 2 DM with CKD stage 2 and hypertension ([place]) Discussed general issues about diabetes pathophysiology and management. Education: Reviewed 'ABCs' of diabetes management (respective goals in parentheses):  A1C (<7), blood pressure (<130/80), and cholesterol (LDL <70) Dietary recommendations WILL ADD ON GLP-1 FOR WEIGHT AND GLUCOSE CONTROL, Ozempic Encouraged aerobic exercise.  Discussed foot care, check daily Yearly retinal exam Dental exam every 6 months Monitor blood glucose, discussed goal for patient -     Hemoglobin A1c ([facility])  CKD stage 2 due to type 2 diabetes mellitus (HCC) Continue glucose monitoring Continue medications Increase fluids Avoid NSAIDS Discussed dietary modifications and exercise Will continue to monitor  Poorly controlled type 2 diabetes mellitus with peripheral neuropathy (HCC) See above Checks feet daily  Vitamin D deficiency Continue supplementation Taking Vitamin D 4,000 IU daily -     Vitamin D (25 hydroxy)  Obesity (BMI 30.0-34.9) Discussed dietary and exercise modifications  Medication management Continued   Continue diet and meds as discussed. Further disposition pending results of labs. Discussed med's effects and SE's.  Patient agrees with plan of care and opportunity to ask  questions/voice concerns. Over 30 minutes of chart review, interview, exam, counseling, and critical decision making was performed.   Future Appointments  Date Time Provider [place]  02/21/2021  2:00 PM Liane Comber, NP [facility] None    ----------------------------------------------------------------------------------------------------------------------  HPI 55 y.o. male  presents for 3 month follow up on HTN, HLD, DMII, gout, weight and vitamin D deficiency.   He reports he has been out of some of his medication related to insurance not covering his medications and could not afford them.  BMI is Body mass index is 30.43 kg/m., he has not been working on diet and exercise. Wt Readings from Last 3 Encounters:  11/22/20 237 lb (107.5 kg)  08/16/20 257 lb (116.6 kg)  04/26/20 256 lb 12.8 oz (116.5 kg)    His blood pressure has not been controlled at home, today their BP is BP: 122/70  He does not workout. He denies any cardiac symptoms, chest pains, palpitations, shortness of breath, dizziness or lower extremity edema.     He is on cholesterol medication Atorvastatin and Fenofibrate and denies myalgias.   His cholesterol is not at goal of less than 70. The cholesterol last visit was:   Lab Results  Component Value Date   CHOL 127 08/16/2020   HDL 32 (L) 08/16/2020   LDLCALC 62 08/16/2020   TRIG 314 (H) 08/16/2020   CHOLHDL 4.0 08/16/2020    He has not been working on diet and exercise for DMII  Normal kidney function Hyperlipidemia With neuropathy He was on trulicity but it was too expensive- will send in Rx for Ozempic. He is taking Levemir 14 units daily every AM  He  is also taking Meformin XR 500mg  two tablets twice a day.  Meter Freestyle- wants to get continuous monitoring.  Eye Exam 07/2019 and denies polydipsia, polyuria, visual disturbances, vomiting and weight loss.     Last A1C in the office was:  Lab Results  Component Value Date   HGBA1C 13.2  (H) 08/16/2020   Lab Results  Component Value Date   GFRAA 120 08/16/2020   Patient is on Vitamin D supplement for deficiency and taking 4,000IU daily.   Lab Results  Component Value Date   VD25OH 25 (L) 09/29/2019       Current Medications:   Current Outpatient Medications (Endocrine & Metabolic):  .  Semaglutide,0.25 or 0.5MG /DOS, (OZEMPIC, 0.25 OR 0.5 MG/DOSE,) 2 MG/1.5ML SOPN, Inject 0.5 mg into the skin once a week. .  insulin detemir (LEVEMIR) 100 UNIT/ML injection, Increase to 15 units subcutaneously daily, then if fasting sugars still 150+ in 2 weeks increase to 18 units. .  metFORMIN (GLUCOPHAGE-XR) 500 MG 24 hr tablet, Take 2 tablets 2 x/ day for Diabetes  Current Outpatient Medications (Cardiovascular):  .  atorvastatin (LIPITOR) 10 MG tablet, Take 1 tablet (10 mg total) by mouth daily. .  benazepril (LOTENSIN) 20 MG tablet, Take 1 tablet Daily for BP & Diabetic Kidney Protection .  bisoprolol-hydrochlorothiazide (ZIAC) 5-6.25 MG tablet, TAKE 1 TABLET DAILY FOR BLOOD PRESSURE .  fenofibrate micronized (LOFIBRA) 200 MG capsule, Take 1 capsule (200 mg total) by mouth daily before breakfast.   Current Outpatient Medications (Analgesics):  .  allopurinol (ZYLOPRIM) 300 MG tablet, Take 1 tablet (300 mg total) by mouth daily. Marland Kitchen  aspirin 81 MG tablet, Take 81 mg by mouth daily.  Current Outpatient Medications (Hematological):  .  vitamin B-12 (CYANOCOBALAMIN) 100 MCG tablet, Take 100 mcg by mouth daily.  Current Outpatient Medications (Other):  Marland Kitchen  Continuous Blood Gluc Sensor (FREESTYLE LIBRE 14 DAY SENSOR) MISC, 1 Units by Does not apply route every 14 (fourteen) days. .  Blood Glucose Monitoring Suppl (FREESTYLE LITE) DEVI, 1 device to check sugars, patient on insulin .  Cholecalciferol (VITAMIN D3) 1.25 MG (50000 UT) CAPS, TAKE 1 CAPSULE 3 X / WEEK .  Continuous Blood Gluc Receiver (FREESTYLE LIBRE 14 DAY READER) DEVI, 1 Device by Does not apply route every 14 (fourteen)  days. .  Continuous Blood Gluc Sensor (FREESTYLE LIBRE 14 DAY SENSOR) MISC, Apply 1 application topically every 14 (fourteen) days. Marland Kitchen  glucose blood (FREESTYLE LITE) test strip, Test sugar 3 x daily due to hyperglycemia and insulin use. E11.22 .  Magnesium 500 MG TABS, Take by mouth daily. .  Multiple Vitamin (MULTIVITAMIN WITH MINERALS) TABS tablet, Take 1 tablet by mouth daily. .  phentermine (ADIPEX-P) 37.5 MG tablet, Take one tablet by mouth every morning. .  topiramate (TOPAMAX) 50 MG tablet, Take 1/2 to 1 tablet 2 x / day at Suppertime & Bedtime for Dieting & Weight Loss  Allergies:  Allergies  Allergen Reactions  . 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 Welchol [Colesevelam Hcl]     Chest pain     Medical History:  Past Medical History:  Diagnosis Date  . Carpal tunnel syndrome on both sides   . Diabetes mellitus type 2, insulin dependent ([place])   . Fatty liver disease, nonalcoholic   . Gout   . Hyperlipidemia   . Hypertension   . Kidney stone   . Vitamin D deficiency     Family history- Reviewed and  unchanged Social history- Reviewed and unchanged   Review of Systems:  Review of Systems  Constitutional: Negative for chills, diaphoresis, fever, malaise/fatigue and weight loss.  HENT: Negative for congestion, ear discharge, ear pain, hearing loss, nosebleeds, sinus pain, sore throat and tinnitus.   Eyes: Negative for blurred vision, double vision, photophobia, pain, discharge and redness.  Respiratory: Negative for cough, hemoptysis, sputum production, shortness of breath, wheezing and stridor.   Cardiovascular: Negative for chest pain, palpitations, orthopnea, claudication, leg swelling and PND.  Gastrointestinal: Negative for abdominal pain, blood in stool, constipation, diarrhea, heartburn, melena, nausea and vomiting.  Genitourinary: Negative for dysuria, flank pain, frequency, hematuria and urgency.  Musculoskeletal: Negative for back pain,  falls, joint pain, myalgias and neck pain.  Skin: Negative for itching and rash.  Neurological: Negative for dizziness, tingling, tremors, sensory change, speech change, focal weakness, seizures, loss of consciousness, weakness and headaches.  Endo/Heme/Allergies: Negative for environmental allergies and polydipsia. Does not bruise/bleed easily.  Psychiatric/Behavioral: Negative for depression, hallucinations, memory loss, substance abuse and suicidal ideas. The patient is not nervous/anxious and does not have insomnia.       Physical Exam: BP 122/70   Pulse 67   Temp 98.7 F (37.1 C)   Ht 6\' 2"  (1.88 m)   Wt 237 lb (107.5 kg)   SpO2 99%   BMI 30.43 kg/m  Wt Readings from Last 3 Encounters:  11/22/20 237 lb (107.5 kg)  08/16/20 257 lb (116.6 kg)  04/26/20 256 lb 12.8 oz (116.5 kg)   General Appearance: Well nourished, in no apparent distress. Eyes: PERRLA, EOMs, conjunctiva no swelling or erythema ENT/Mouth: Ext aud canals clear, TMs without erythema, bulging. Hearing normal.  Neck: Supple, thyroid normal.  Respiratory: Respiratory effort normal, BS equal bilaterally without rales, rhonchi, wheezing or stridor.  Cardio: RRR with no MRGs. Brisk peripheral pulses without edema.  Abdomen: Soft, + BS.  Non tender, no guarding, rebound, hernias, masses. Lymphatics: Non tender without lymphadenopathy.  Musculoskeletal: Full ROM, 5/5 strength, Normal gait Skin: Warm, dry without rashes, lesions, ecchymosis.  Neuro: Cranial nerves intact. No cerebellar symptoms.  Psych: Awake and oriented X 3, normal affect, Insight and Judgment appropriate.     Garnet Sierras, Laqueta Jean, DNP [facility] 11/22/2020  1:39 PM

## 2020-11-22 ENCOUNTER — Encounter: Payer: Self-pay | Admitting: Adult Health Nurse Practitioner

## 2020-11-22 ENCOUNTER — Other Ambulatory Visit: Payer: Self-pay

## 2020-11-22 ENCOUNTER — Ambulatory Visit (INDEPENDENT_AMBULATORY_CARE_PROVIDER_SITE_OTHER): Payer: 59 | Admitting: Adult Health Nurse Practitioner

## 2020-11-22 VITALS — BP 122/70 | HR 67 | Temp 98.7°F | Ht 74.0 in | Wt 237.0 lb

## 2020-11-22 DIAGNOSIS — E1122 Type 2 diabetes mellitus with diabetic chronic kidney disease: Secondary | ICD-10-CM | POA: Diagnosis not present

## 2020-11-22 DIAGNOSIS — E785 Hyperlipidemia, unspecified: Secondary | ICD-10-CM

## 2020-11-22 DIAGNOSIS — E669 Obesity, unspecified: Secondary | ICD-10-CM

## 2020-11-22 DIAGNOSIS — E559 Vitamin D deficiency, unspecified: Secondary | ICD-10-CM

## 2020-11-22 DIAGNOSIS — E1142 Type 2 diabetes mellitus with diabetic polyneuropathy: Secondary | ICD-10-CM

## 2020-11-22 DIAGNOSIS — E66811 Obesity, class 1: Secondary | ICD-10-CM

## 2020-11-22 DIAGNOSIS — N182 Chronic kidney disease, stage 2 (mild): Secondary | ICD-10-CM

## 2020-11-22 DIAGNOSIS — Z79899 Other long term (current) drug therapy: Secondary | ICD-10-CM

## 2020-11-22 DIAGNOSIS — I10 Essential (primary) hypertension: Secondary | ICD-10-CM | POA: Diagnosis not present

## 2020-11-22 DIAGNOSIS — E782 Mixed hyperlipidemia: Secondary | ICD-10-CM

## 2020-11-22 DIAGNOSIS — N181 Chronic kidney disease, stage 1: Secondary | ICD-10-CM

## 2020-11-22 DIAGNOSIS — M1000 Idiopathic gout, unspecified site: Secondary | ICD-10-CM

## 2020-11-22 DIAGNOSIS — Z794 Long term (current) use of insulin: Secondary | ICD-10-CM

## 2020-11-22 DIAGNOSIS — E1169 Type 2 diabetes mellitus with other specified complication: Secondary | ICD-10-CM

## 2020-11-22 DIAGNOSIS — E1165 Type 2 diabetes mellitus with hyperglycemia: Secondary | ICD-10-CM

## 2020-11-22 MED ORDER — PHENTERMINE HCL 37.5 MG PO TABS: ORAL_TABLET | ORAL | 1 refills | Status: DC

## 2020-11-22 MED ORDER — OZEMPIC (0.25 OR 0.5 MG/DOSE) 2 MG/1.5ML ~~LOC~~ SOPN: 0.5000 mg | PEN_INJECTOR | SUBCUTANEOUS | 0 refills | Status: DC

## 2020-11-22 MED ORDER — FREESTYLE LIBRE 14 DAY SENSOR MISC: 1.0000 [IU] | 12 refills | Status: DC

## 2020-11-22 MED ORDER — FENOFIBRATE MICRONIZED 200 MG PO CAPS: 200.0000 mg | ORAL_CAPSULE | Freq: Every day | ORAL | 1 refills | Status: DC

## 2020-11-22 MED ORDER — ATORVASTATIN CALCIUM 10 MG PO TABS: 10.0000 mg | ORAL_TABLET | Freq: Every day | ORAL | 2 refills | Status: DC

## 2020-11-22 MED ORDER — TOPIRAMATE 50 MG PO TABS: ORAL_TABLET | ORAL | 1 refills | Status: DC

## 2020-11-22 MED ORDER — ALLOPURINOL 300 MG PO TABS: 300.0000 mg | ORAL_TABLET | Freq: Every day | ORAL | 2 refills | Status: DC

## 2020-11-22 MED ORDER — METFORMIN HCL ER 500 MG PO TB24: ORAL_TABLET | ORAL | 3 refills | Status: DC

## 2020-11-22 MED ORDER — INSULIN DETEMIR 100 UNIT/ML ~~LOC~~ SOLN
SUBCUTANEOUS | 3 refills | Status: DC
Start: 2020-11-22 — End: 2021-02-21

## 2020-11-23 LAB — COMPLETE METABOLIC PANEL WITH GFR
AG Ratio: 1.2 (calc) (ref 1.0–2.5)
ALT: 32 U/L (ref 9–46)
AST: 20 U/L (ref 10–35)
Albumin: 4.2 g/dL (ref 3.6–5.1)
Alkaline phosphatase (APISO): 89 U/L (ref 35–144)
BUN/Creatinine Ratio: 20 (calc) (ref 6–22)
BUN: 14 mg/dL (ref 7–25)
CO2: 29 mmol/L (ref 20–32)
Calcium: 10.3 mg/dL (ref 8.6–10.3)
Chloride: 100 mmol/L (ref 98–110)
Creat: 0.69 mg/dL — ABNORMAL LOW (ref 0.70–1.33)
GFR, Est African American: 124 mL/min/{1.73_m2} (ref >=60)
GFR, Est Non African American: 107 mL/min/{1.73_m2} (ref >=60)
Globulin: 3.6 g/dL (calc) (ref 1.9–3.7)
Glucose, Bld: 142 mg/dL — ABNORMAL HIGH (ref 65–99)
Potassium: 4.6 mmol/L (ref 3.5–5.3)
Sodium: 137 mmol/L (ref 135–146)
Total Bilirubin: 0.4 mg/dL (ref 0.2–1.2)
Total Protein: 7.8 g/dL (ref 6.1–8.1)

## 2020-11-23 LAB — CBC WITH DIFFERENTIAL/PLATELET
Absolute Monocytes: 545 cells/uL (ref 200–950)
Basophils Absolute: 70 cells/uL (ref 0–200)
Basophils Relative: 1.2 %
Eosinophils Absolute: 331 cells/uL (ref 15–500)
Eosinophils Relative: 5.7 %
HCT: 41.5 % (ref 38.5–50.0)
Hemoglobin: 14.2 g/dL (ref 13.2–17.1)
Lymphs Abs: 2407 cells/uL (ref 850–3900)
MCH: 30.3 pg (ref 27.0–33.0)
MCHC: 34.2 g/dL (ref 32.0–36.0)
MCV: 88.7 fL (ref 80.0–100.0)
MPV: 10.8 fL (ref 7.5–12.5)
Monocytes Relative: 9.4 %
Neutro Abs: 2448 cells/uL (ref 1500–7800)
Neutrophils Relative %: 42.2 %
Platelets: 237 10*3/uL (ref 140–400)
RBC: 4.68 10*6/uL (ref 4.20–5.80)
RDW: 12.6 % (ref 11.0–15.0)
Total Lymphocyte: 41.5 %
WBC: 5.8 10*3/uL (ref 3.8–10.8)

## 2020-11-23 LAB — LIPID PANEL
Cholesterol: 111 mg/dL (ref <200)
HDL: 44 mg/dL (ref >=40)
LDL Cholesterol (Calc): 51 mg/dL (calc)
Non-HDL Cholesterol (Calc): 67 mg/dL (calc) (ref <130)
Total CHOL/HDL Ratio: 2.5 (calc) (ref <5.0)
Triglycerides: 75 mg/dL (ref <150)

## 2020-11-23 LAB — HEMOGLOBIN A1C
Hgb A1c MFr Bld: 8.7 % of total Hgb — ABNORMAL HIGH (ref <5.7)
Mean Plasma Glucose: 203 mg/dL
eAG (mmol/L): 11.2 mmol/L

## 2020-11-23 LAB — INSULIN, RANDOM: Insulin: 47.1 u[IU]/mL — ABNORMAL HIGH

## 2020-11-23 LAB — MICROALBUMIN / CREATININE URINE RATIO
Creatinine, Urine: 78 mg/dL (ref 20–320)
Microalb Creat Ratio: 63 mcg/mg creat — ABNORMAL HIGH (ref <30)
Microalb, Ur: 4.9 mg/dL

## 2021-01-03 ENCOUNTER — Other Ambulatory Visit: Payer: Self-pay | Admitting: Internal Medicine

## 2021-01-03 DIAGNOSIS — E669 Obesity, unspecified: Secondary | ICD-10-CM

## 2021-01-18 ENCOUNTER — Other Ambulatory Visit: Payer: Self-pay | Admitting: Internal Medicine

## 2021-01-19 ENCOUNTER — Encounter: Payer: PRIVATE HEALTH INSURANCE | Admitting: Internal Medicine

## 2021-02-18 LAB — HM DIABETES EYE EXAM

## 2021-02-20 ENCOUNTER — Encounter: Payer: Self-pay | Admitting: Adult Health

## 2021-02-20 NOTE — Progress Notes (Signed)
Complete Physical  Assessment and Plan:  Delvecchio was seen today for annual exam.  Diagnoses and all orders for this visit:  Encounter for routine adult health examination without abnormal findings Due annually   Atherosclerosis of aorta Per CT 2016 Control blood pressure, cholesterol, glucose, increase exercise.   Primary hypertension Continue medication Monitor blood pressure at home; call if consistently over 130/80 Continue DASH diet.   Reminder to go to the ER if any CP, SOB, nausea, dizziness, severe HA, changes vision/speech, left arm numbness and tingling and jaw pain. -     CBC with Differential/Platelet -     COMPLETE METABOLIC PANEL WITH GFR -     Magnesium -     TSH -     Microalbumin / creatinine urine ratio -     Urinalysis, Routine w reflex microscopic -     EKG 12-Lead  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) Improving with ozempic; increase to 1 mg/week Titrate down levemir by 2 units if any readings <90 Education: Reviewed 'ABCs' of diabetes management (respective goals in parentheses):  A1C (<7), blood pressure (<130/80), and cholesterol (LDL <70) Eye Exam yearly and Dental Exam every 6 months. Dietary recommendations Physical Activity recommendations - Strongly advisedto start checking sugars at different times of the day - check 2 times a day, rotating checks - given sugar log and advised how to fill it and to bring it at next appt  - given foot care handout and explained the principles  - given instructions for hypoglycemia management  -     COMPLETE METABOLIC PANEL WITH GFR -     Hemoglobin A1c -     Microalbumin / creatinine urine ratio -     Urinalysis, Routine w reflex microscopic  CKD stage 1 due to type 2 diabetes mellitus (HCC) Increase fluids, avoid NSAIDS, monitor sugars, will monitor Continue ACEi/ARB -     COMPLETE METABOLIC PANEL WITH GFR -     Microalbumin / creatinine urine ratio -     Urinalysis, Routine w reflex  microscopic  Hyperlipidemia associated with type 2 diabetes mellitus (HCC) Continue medications LDL goal <70 Continue low cholesterol diet and exercise.  Check lipid panel.  -     Lipid panel -     TSH  Poorly controlled type 2 diabetes mellitus with peripheral neuropathy (HCC) Declines medications at this time, discussed feet checks.  -     Hemoglobin A1c -     HM DIABETES FOOT EXAM  Idiopathic gout, unspecified chronicity, unspecified site Continue allopurinol Diet discussed -     Uric acid  Obesity (BMI 30.0-34.9) Long discussion about weight loss, diet, and exercise Recommended diet heavy in fruits and veggies and low in animal meats, cheeses, and dairy products, appropriate calorie intake Patient will work on high fiber, portions, continue ozempic, perceives benefit with phentermine but will plan to taper/d/c as weight trends down Discussed appropriate weight for height  Follow up at next visit  Vitamin D deficiency -     VITAMIN D 25 Hydroxy (Vit-D Deficiency, Fractures)  Screening for hematuria or proteinuria -     Microalbumin / creatinine urine ratio -     Urinalysis, Routine w reflex microscopic  Screening for cardiovascular condition -     EKG 12-Lead  Screening for prostate cancer -     PSA  Screening for colon cancer -     Ambulatory referral to Gastroenterology  Pulsatile abdomen -     VAS [place]  AAA DUPLEX; Future   Discussed med's effects and SE's. Screening labs and tests as requested with regular follow-up as recommended. Over 40 minutes of exam, counseling, chart review and critical decision making was performed  Future Appointments  Date Time Provider [place]  09/12/2021 10:30 AM Liane Comber, NP [facility] None  02/21/2022  2:00 PM Liane Comber, NP [facility] None    HPI 55 y.o. male patient presents for a complete physical. He has Hypertension; Hyperlipidemia associated with type 2 diabetes mellitus ([place]); Vitamin D deficiency;  Medication management; Type 2 DM with CKD and hypertension ([place]); Gout; CKD stage 1 due to type 2 diabetes mellitus ([place]); Former smoker; Obesity (BMI 30.0-34.9); and Poorly controlled type 2 diabetes mellitus with peripheral neuropathy ([place]) on their problem list.  He is married, grown daughter, 1 grandson just turned 4. He works as a Training and development officer.  No concerns today.   He is no longer smoking cigars, quit 2019.   BMI is Body mass index is 32.09 kg/m., he has been working on diet and exercise. Prescribed phentermine, taking ozempic with benefit, weights trending down. Currently doing lent, holding all pasta, red meat, bread, alcohol, sweets/sodas, down to 2 cups of coffee per day, only doing splenda.   He goes to gym 3 nights a week, also cuts grass.  Wt Readings from Last 3 Encounters:  02/21/21 246 lb 9.6 oz (111.9 kg)  11/22/20 237 lb (107.5 kg)  08/16/20 257 lb (116.6 kg)   He has aortic atherosclerosis per CT abd 2016  His blood pressure has been controlled at home, today their BP is BP: 112/70 He does workout. He denies chest pain, shortness of breath, dizziness.   He is on cholesterol medication (atorvastatin 10 mg daily) and denies myalgias. His cholesterol is at goal. The cholesterol last visit was:   Lab Results  Component Value Date   CHOL 111 11/22/2020   HDL 44 11/22/2020   LDLCALC 51 11/22/2020   TRIG 75 11/22/2020   CHOLHDL 2.5 11/22/2020   He has been working on diet and exercise for uncontrolled T2DM, currently treated by metformin 2000 mg daily, levimir 12 units, ozempic doing 0.5 mg/week, no SE, he is on bASA, he is on ACE/ARB and denies hypoglycemia , increased appetite, nausea, paresthesia of the feet, polydipsia, polyuria and visual disturbances.  Fasting running 93-125 recently.  Last A1C in the office was:  Lab Results  Component Value Date   HGBA1C 8.7 (H) 11/22/2020   Last GFR: Lab Results  Component Value Date   [facility] 107 11/22/2020   GFRNONAA 103  08/16/2020   GFRNONAA 97 04/26/2020    Patient is on Vitamin D supplement, taking 15000 IU daily  Lab Results  Component Value Date   VD25OH 25 (L) 09/29/2019     Denies noctura or LUTs. Last PSA was: Lab Results  Component Value Date   PSA 0.2 12/02/2018      Current Medications:  Current Outpatient Medications on File Prior to Visit  Medication Sig Dispense Refill  . allopurinol (ZYLOPRIM) 300 MG tablet Take 1 tablet (300 mg total) by mouth daily. 90 tablet 2  . aspirin 81 MG tablet Take 81 mg by mouth daily.    Marland Kitchen atorvastatin (LIPITOR) 10 MG tablet Take 1 tablet (10 mg total) by mouth daily. 90 tablet 2  . benazepril (LOTENSIN) 20 MG tablet TAKE 1 TABLET DAILY FOR BP & DIABETIC KIDNEY PROTECTION 90 tablet 1  . bisoprolol-hydrochlorothiazide (ZIAC) 5-6.25 MG tablet TAKE 1 TABLET DAILY  FOR BLOOD PRESSURE 90 tablet 3  . Blood Glucose Monitoring Suppl (FREESTYLE LITE) DEVI 1 device to check sugars, patient on insulin 1 each 0  . CHOLECALCIFEROL PO Take 15,000 Units by mouth daily.    . Continuous Blood Gluc Receiver (FREESTYLE LIBRE 14 DAY READER) DEVI 1 Device by Does not apply route every 14 (fourteen) days. 2 each 4  . Continuous Blood Gluc Sensor (FREESTYLE LIBRE 14 DAY SENSOR) MISC 1 Units by Does not apply route every 14 (fourteen) days. 3 each 12  . fenofibrate micronized (LOFIBRA) 200 MG capsule Take 1 capsule (200 mg total) by mouth daily before breakfast. 90 capsule 1  . glucose blood (FREESTYLE LITE) test strip Test sugar 3 x daily due to hyperglycemia and insulin use. E11.22 100 each 3  . Magnesium 500 MG TABS Take by mouth daily.    . metFORMIN (GLUCOPHAGE-XR) 500 MG 24 hr tablet Take 2 tablets 2 x/ day for Diabetes 360 tablet 3  . Multiple Vitamin (MULTIVITAMIN WITH MINERALS) TABS tablet Take 1 tablet by mouth daily.    . phentermine (ADIPEX-P) 37.5 MG tablet TAKE 1 TABLET EVERY MORNING FOR DIETING & WEIGHT LOSS 90 tablet 0  . vitamin B-12 (CYANOCOBALAMIN) 100 MCG  tablet Take 100 mcg by mouth daily.     No current facility-administered medications on file prior to visit.   Allergies:  Allergies  Allergen Reactions  . 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 Welchol [Colesevelam Hcl]     Chest pain   Health Maintenance:  Immunization History  Administered Date(s) Administered  . Influenza Inj Mdck Quad With Preservative 09/17/2017, 09/16/2018, 09/29/2019, 08/16/2020  . Influenza Split 09/05/2015  . Influenza,inj,quad, With Preservative 10/22/2016  . Moderna Sars-Covid-2 Vaccination 12/01/2019, 12/29/2019  . Pneumococcal Polysaccharide-23 09/05/2015  . Pneumococcal-Unspecified 11/22/2009  . Td 09/17/2017  . Tdap 11/22/2006    Tetanus: 2018 Pneumovax: 2016 Flu vaccine: 08/2020 Shingrix: check with insurance Covid 19; 3/3, 2021, moderna, send booster information   Colonoscopy: never, GI referrel placed EGD: n/a  Eye Exam: negative 02/18/2021, Dr. Zadie Rhine, no retinopathy  Dentist: last this AM, 02/2021, goes q41m  Patient Care Team: Unk Pinto, MD as PCP - General (Internal Medicine)  Medical History:  has Hypertension; Hyperlipidemia associated with type 2 diabetes mellitus ([place]); Vitamin D deficiency; Medication management; Type 2 DM with CKD and hypertension ([place]); Gout; CKD stage 1 due to type 2 diabetes mellitus ([place]); Former smoker; Obesity (BMI 30.0-34.9); and Poorly controlled type 2 diabetes mellitus with peripheral neuropathy ([place]) on their problem list. Surgical History:  He  has a past surgical history that includes Cystoscopy with retrograde pyelogram, ureteroscopy and stent placement (Right, 02/01/2015) and Holmium laser application (Right, 4/00/8676). Family History:  His family history includes Alzheimer's disease in his father; Asthma in his mother; Diabetes in his brother, father, and mother; Hyperlipidemia in his brother; Hypertension in his brother, father, and mother; Stroke (age of onset: 39)  in his father. Social History:   reports that he quit smoking about 2 years ago. His smoking use included cigars. He has never used smokeless tobacco. He reports current alcohol use of about 2.0 standard drinks of alcohol per week. He reports that he does not use drugs.   Review of Systems:  Review of Systems  Constitutional: Negative for malaise/fatigue and weight loss.  HENT: Negative for hearing loss and tinnitus.   Eyes: Negative for blurred vision and double vision.  Respiratory: Negative for cough, sputum  production, shortness of breath and wheezing.   Cardiovascular: Negative for chest pain, palpitations, orthopnea, claudication, leg swelling and PND.  Gastrointestinal: Negative for abdominal pain, blood in stool, constipation, diarrhea, heartburn, melena, nausea and vomiting.  Genitourinary: Negative.   Musculoskeletal: Negative for falls, joint pain and myalgias.  Skin: Negative for rash.  Neurological: Negative for dizziness, tingling, sensory change, weakness and headaches.  Endo/Heme/Allergies: Negative for polydipsia.  Psychiatric/Behavioral: Negative.  Negative for depression, memory loss, substance abuse and suicidal ideas. The patient is not nervous/anxious and does not have insomnia.   All other systems reviewed and are negative.   Physical Exam: Estimated body mass index is 32.09 kg/m as calculated from the following:   Height as of this encounter: 6' 1.5" (1.867 m).   Weight as of this encounter: 246 lb 9.6 oz (111.9 kg). BP 112/70   Pulse 72   Temp (!) 97.3 F (36.3 C)   Ht 6' 1.5" (1.867 m)   Wt 246 lb 9.6 oz (111.9 kg)   SpO2 98%   BMI 32.09 kg/m  General Appearance: Well nourished, in no apparent distress.  Eyes: PERRLA, EOMs, conjunctiva no swelling or erythema Sinuses: No Frontal/maxillary tenderness  ENT/Mouth: Ext aud canals clear, normal light reflex with TMs without erythema, bulging. Good dentition. No erythema, swelling, or exudate on post pharynx.  Tonsils not swollen or erythematous. Hearing normal.  Neck: Supple, thyroid normal. No bruits. He has unchanged soft tissue mass to right anterior neck.  Respiratory: Respiratory effort normal, BS equal bilaterally without rales, rhonchi, wheezing or stridor.  Cardio: RRR without murmurs, rubs or gallops. Brisk peripheral pulses without edema.  Chest: symmetric, with normal excursions and percussion.  Abdomen: Soft, nontender, no guarding, rebound,  masses, or organomegaly. Small ventral hernia with bearing down, pulsatile rippling seen at surface of abdomen; irregular pulsing felt with palpation, irregular.  Lymphatics: Non tender without lymphadenopathy.  Genitourinary: doing self exams, no concerns, declines Musculoskeletal: Full ROM all peripheral extremities,5/5 strength, and normal gait.  Skin: Warm, dry without rashes, lesions, ecchymosis. Neuro: Cranial nerves intact, reflexes equal bilaterally. Normal muscle tone, no cerebellar symptoms. Sensation diminished bil feet to monofilament.  Psych: Awake and oriented X 3, normal affect, Insight and Judgment appropriate.   EKG: NSR, 1st degree AVB, NSCPT  Gorden Harms Linh Johannes 5:41 PM [facility]

## 2021-02-21 ENCOUNTER — Other Ambulatory Visit: Payer: Self-pay

## 2021-02-21 ENCOUNTER — Ambulatory Visit (INDEPENDENT_AMBULATORY_CARE_PROVIDER_SITE_OTHER): Payer: 59 | Admitting: Adult Health

## 2021-02-21 ENCOUNTER — Encounter: Payer: Self-pay | Admitting: Adult Health

## 2021-02-21 VITALS — BP 112/70 | HR 72 | Temp 97.3°F | Ht 73.5 in | Wt 246.6 lb

## 2021-02-21 DIAGNOSIS — F17200 Nicotine dependence, unspecified, uncomplicated: Secondary | ICD-10-CM

## 2021-02-21 DIAGNOSIS — I10 Essential (primary) hypertension: Secondary | ICD-10-CM | POA: Diagnosis not present

## 2021-02-21 DIAGNOSIS — E1169 Type 2 diabetes mellitus with other specified complication: Secondary | ICD-10-CM

## 2021-02-21 DIAGNOSIS — Z136 Encounter for screening for cardiovascular disorders: Secondary | ICD-10-CM

## 2021-02-21 DIAGNOSIS — N182 Chronic kidney disease, stage 2 (mild): Secondary | ICD-10-CM

## 2021-02-21 DIAGNOSIS — E1122 Type 2 diabetes mellitus with diabetic chronic kidney disease: Secondary | ICD-10-CM

## 2021-02-21 DIAGNOSIS — R198 Other specified symptoms and signs involving the digestive system and abdomen: Secondary | ICD-10-CM

## 2021-02-21 DIAGNOSIS — E349 Endocrine disorder, unspecified: Secondary | ICD-10-CM

## 2021-02-21 DIAGNOSIS — Z1389 Encounter for screening for other disorder: Secondary | ICD-10-CM | POA: Diagnosis not present

## 2021-02-21 DIAGNOSIS — Z0000 Encounter for general adult medical examination without abnormal findings: Secondary | ICD-10-CM

## 2021-02-21 DIAGNOSIS — Z87891 Personal history of nicotine dependence: Secondary | ICD-10-CM

## 2021-02-21 DIAGNOSIS — N401 Enlarged prostate with lower urinary tract symptoms: Secondary | ICD-10-CM

## 2021-02-21 DIAGNOSIS — M1000 Idiopathic gout, unspecified site: Secondary | ICD-10-CM | POA: Diagnosis not present

## 2021-02-21 DIAGNOSIS — R350 Frequency of micturition: Secondary | ICD-10-CM

## 2021-02-21 DIAGNOSIS — Z131 Encounter for screening for diabetes mellitus: Secondary | ICD-10-CM | POA: Diagnosis not present

## 2021-02-21 DIAGNOSIS — E559 Vitamin D deficiency, unspecified: Secondary | ICD-10-CM

## 2021-02-21 DIAGNOSIS — IMO0001 Reserved for inherently not codable concepts without codable children: Secondary | ICD-10-CM

## 2021-02-21 DIAGNOSIS — Z79899 Other long term (current) drug therapy: Secondary | ICD-10-CM | POA: Diagnosis not present

## 2021-02-21 DIAGNOSIS — E669 Obesity, unspecified: Secondary | ICD-10-CM

## 2021-02-21 DIAGNOSIS — I700 Atherosclerosis of aorta: Secondary | ICD-10-CM | POA: Diagnosis not present

## 2021-02-21 DIAGNOSIS — Z125 Encounter for screening for malignant neoplasm of prostate: Secondary | ICD-10-CM

## 2021-02-21 DIAGNOSIS — Z13220 Encounter for screening for lipoid disorders: Secondary | ICD-10-CM | POA: Diagnosis not present

## 2021-02-21 DIAGNOSIS — Z1211 Encounter for screening for malignant neoplasm of colon: Secondary | ICD-10-CM

## 2021-02-21 DIAGNOSIS — E785 Hyperlipidemia, unspecified: Secondary | ICD-10-CM

## 2021-02-21 DIAGNOSIS — N181 Chronic kidney disease, stage 1: Secondary | ICD-10-CM

## 2021-02-21 DIAGNOSIS — E1142 Type 2 diabetes mellitus with diabetic polyneuropathy: Secondary | ICD-10-CM

## 2021-02-21 MED ORDER — INSULIN DETEMIR 100 UNIT/ML ~~LOC~~ SOLN: SUBCUTANEOUS | 3 refills | Status: DC

## 2021-02-21 MED ORDER — SEMAGLUTIDE (1 MG/DOSE) 4 MG/3ML ~~LOC~~ SOPN: 1.0000 mg | PEN_INJECTOR | SUBCUTANEOUS | 1 refills | Status: DC

## 2021-02-21 MED ORDER — FREESTYLE LIBRE 14 DAY SENSOR MISC: 1.0000 "application " | 4 refills | Status: DC

## 2021-02-21 NOTE — Patient Instructions (Addendum)
Arthur Hudson , Thank you for taking time to come for your Annual Wellness Visit. I appreciate your ongoing commitment to your health goals. Please review the following plan we discussed and let me know if I can assist you in the future.   These are the goals we discussed: Goals    . Fasting Blood Glucose <130    . HEMOGLOBIN A1C < 8    . Weight (lb) < 240 lb (108.9 kg)     Check weight weekly       This is a list of the screening recommended for you and due dates:  Health Maintenance  Topic Date Due  .  Hepatitis C: One time screening is recommended by Center for Disease Control  (CDC) for  adults born from [birth year] through 1966.   Never done  . Colon Cancer Screening  Never done  . Eye exam for diabetics  02/14/2016  . COVID-19 Vaccine (3 - Booster for Moderna series) 06/27/2020  . Hemoglobin A1C  05/22/2021  . Flu Shot  06/12/2021  . Complete foot exam   02/21/2022  . Tetanus Vaccine  09/18/2027  . Pneumococcal vaccine  Completed  . HIV Screening  Completed  . HPV Vaccine  Aged Out      Please check with insurance about shingrix coverage - if they cover can get at [facility]    Know what a healthy weight is for you (roughly BMI <25) and aim to maintain this  Aim for 7+ servings of fruits and vegetables daily  65-80+ fluid ounces of water or unsweet tea for healthy kidneys  Limit to max 1 drink of alcohol per day; avoid smoking/tobacco  Limit animal fats in diet for cholesterol and heart health - choose grass fed whenever available  Avoid highly processed foods, and foods high in saturated/trans fats  Aim for low stress - take time to unwind and care for your mental health  Aim for 150 min of moderate intensity exercise weekly for heart health, and weights twice weekly for bone health  Aim for 7-9 hours of sleep daily    Zoster Vaccine, Recombinant injection What is this medicine? ZOSTER VACCINE (ZOS ter vak SEEN) is a vaccine used to reduce the risk of getting  shingles. This vaccine is not used to treat shingles or nerve pain from shingles. This medicine may be used for other purposes; ask your health care provider or pharmacist if you have questions. COMMON BRAND NAME(S): [facility] What should I tell my health care provider before I take this medicine? They need to know if you have any of these conditions:  cancer  immune system problems  an unusual or allergic reaction to Zoster vaccine, other medications, foods, dyes, or preservatives  pregnant or trying to get pregnant  breast-feeding How should I use this medicine? This vaccine is injected into a muscle. It is given by a health care provider. A copy of Vaccine Information Statements will be given before each vaccination. Be sure to read this information carefully each time. This sheet may change often. Talk to your health care provider about the use of this vaccine in children. This vaccine is not approved for use in children. Overdosage: If you think you have taken too much of this medicine contact a poison control center or emergency room at once. NOTE: This medicine is only for you. Do not share this medicine with others. What if I miss a dose? Keep appointments for follow-up (booster) doses. It is important not  to miss your dose. Call your health care provider if you are unable to keep an appointment. What may interact with this medicine?  medicines that suppress your immune system  medicines to treat cancer  steroid medicines like prednisone or cortisone This list may not describe all possible interactions. Give your health care provider a list of all the medicines, herbs, non-prescription drugs, or dietary supplements you use. Also tell them if you smoke, drink alcohol, or use illegal drugs. Some items may interact with your medicine. What should I watch for while using this medicine? Visit your health care provider regularly. This vaccine, like all vaccines, may not fully  protect everyone. What side effects may I notice from receiving this medicine? Side effects that you should report to your doctor or health care professional as soon as possible:  allergic reactions (skin rash, itching or hives; swelling of the face, lips, or tongue)  trouble breathing Side effects that usually do not require medical attention (report these to your doctor or health care professional if they continue or are bothersome):  chills  headache  fever  nausea  pain, redness, or irritation at site where injected  tiredness  vomiting This list may not describe all possible side effects. Call your doctor for medical advice about side effects. You may report side effects to FDA at 1-800-FDA-1088. Where should I keep my medicine? This vaccine is only given by a health care provider. It will not be stored at home. NOTE: This sheet is a summary. It may not cover all possible information. If you have questions about this medicine, talk to your doctor, pharmacist, or health care provider.  2021 Elsevier/Gold Standard (2019-12-04 16:23:07)

## 2021-02-22 ENCOUNTER — Other Ambulatory Visit: Payer: Self-pay | Admitting: Adult Health

## 2021-02-22 DIAGNOSIS — N289 Disorder of kidney and ureter, unspecified: Secondary | ICD-10-CM

## 2021-02-22 LAB — COMPLETE METABOLIC PANEL WITH GFR
AG Ratio: 1.2 (calc) (ref 1.0–2.5)
ALT: 21 U/L (ref 9–46)
AST: 20 U/L (ref 10–35)
Albumin: 4.4 g/dL (ref 3.6–5.1)
Alkaline phosphatase (APISO): 73 U/L (ref 35–144)
BUN: 25 mg/dL (ref 7–25)
CO2: 24 mmol/L (ref 20–32)
Calcium: 10.3 mg/dL (ref 8.6–10.3)
Chloride: 102 mmol/L (ref 98–110)
Creat: 1.06 mg/dL (ref 0.70–1.33)
GFR, Est African American: 91 mL/min/{1.73_m2} (ref >=60)
GFR, Est Non African American: 79 mL/min/{1.73_m2} (ref >=60)
Globulin: 3.6 g/dL (calc) (ref 1.9–3.7)
Glucose, Bld: 89 mg/dL (ref 65–99)
Potassium: 5 mmol/L (ref 3.5–5.3)
Sodium: 138 mmol/L (ref 135–146)
Total Bilirubin: 0.4 mg/dL (ref 0.2–1.2)
Total Protein: 8 g/dL (ref 6.1–8.1)

## 2021-02-22 LAB — CBC WITH DIFFERENTIAL/PLATELET
Absolute Monocytes: 640 cells/uL (ref 200–950)
Basophils Absolute: 112 cells/uL (ref 0–200)
Basophils Relative: 1.4 %
Eosinophils Absolute: 264 cells/uL (ref 15–500)
Eosinophils Relative: 3.3 %
HCT: 41.9 % (ref 38.5–50.0)
Hemoglobin: 13.4 g/dL (ref 13.2–17.1)
Lymphs Abs: 2856 cells/uL (ref 850–3900)
MCH: 28.9 pg (ref 27.0–33.0)
MCHC: 32 g/dL (ref 32.0–36.0)
MCV: 90.3 fL (ref 80.0–100.0)
MPV: 10.4 fL (ref 7.5–12.5)
Monocytes Relative: 8 %
Neutro Abs: 4128 cells/uL (ref 1500–7800)
Neutrophils Relative %: 51.6 %
Platelets: 343 10*3/uL (ref 140–400)
RBC: 4.64 10*6/uL (ref 4.20–5.80)
RDW: 12.3 % (ref 11.0–15.0)
Total Lymphocyte: 35.7 %
WBC: 8 10*3/uL (ref 3.8–10.8)

## 2021-02-22 LAB — HEMOGLOBIN A1C
Hgb A1c MFr Bld: 7.9 % of total Hgb — ABNORMAL HIGH (ref <5.7)
Mean Plasma Glucose: 180 mg/dL
eAG (mmol/L): 10 mmol/L

## 2021-02-22 LAB — URINALYSIS, ROUTINE W REFLEX MICROSCOPIC
Bilirubin Urine: NEGATIVE
Glucose, UA: NEGATIVE
Hgb urine dipstick: NEGATIVE
Ketones, ur: NEGATIVE
Leukocytes,Ua: NEGATIVE
Nitrite: NEGATIVE
Protein, ur: NEGATIVE
Specific Gravity, Urine: 1.02 (ref 1.001–1.03)
pH: <=5 (ref 5.0–8.0)

## 2021-02-22 LAB — URIC ACID: Uric Acid, Serum: 6 mg/dL (ref 4.0–8.0)

## 2021-02-22 LAB — LIPID PANEL
Cholesterol: 109 mg/dL (ref <200)
HDL: 35 mg/dL — ABNORMAL LOW (ref >=40)
LDL Cholesterol (Calc): 54 mg/dL (calc)
Non-HDL Cholesterol (Calc): 74 mg/dL (calc) (ref <130)
Total CHOL/HDL Ratio: 3.1 (calc) (ref <5.0)
Triglycerides: 113 mg/dL (ref <150)

## 2021-02-22 LAB — MICROALBUMIN / CREATININE URINE RATIO
Creatinine, Urine: 99 mg/dL (ref 20–320)
Microalb Creat Ratio: 17 mcg/mg creat (ref <30)
Microalb, Ur: 1.7 mg/dL

## 2021-02-22 LAB — MAGNESIUM: Magnesium: 1.9 mg/dL (ref 1.5–2.5)

## 2021-02-22 LAB — VITAMIN D 25 HYDROXY (VIT D DEFICIENCY, FRACTURES): Vit D, 25-Hydroxy: 44 ng/mL (ref 30–100)

## 2021-02-22 LAB — PSA: PSA: 0.27 ng/mL (ref <=4.0)

## 2021-02-22 LAB — TSH: TSH: 1.74 mIU/L (ref 0.40–4.50)

## 2021-03-14 ENCOUNTER — Ambulatory Visit (HOSPITAL_COMMUNITY)
Admission: RE | Admit: 2021-03-14 | Discharge: 2021-03-14 | Disposition: A | Discharge status: Home / Self Care | Payer: 59 | Source: Ambulatory Visit | Attending: Cardiovascular Disease | Admitting: Cardiovascular Disease

## 2021-03-14 ENCOUNTER — Other Ambulatory Visit: Payer: Self-pay

## 2021-03-14 DIAGNOSIS — I10 Essential (primary) hypertension: Secondary | ICD-10-CM

## 2021-03-14 DIAGNOSIS — R198 Other specified symptoms and signs involving the digestive system and abdomen: Secondary | ICD-10-CM | POA: Diagnosis not present

## 2021-03-14 DIAGNOSIS — Z136 Encounter for screening for cardiovascular disorders: Secondary | ICD-10-CM | POA: Insufficient documentation

## 2021-03-14 DIAGNOSIS — Z87891 Personal history of nicotine dependence: Secondary | ICD-10-CM | POA: Diagnosis present

## 2021-03-20 ENCOUNTER — Ambulatory Visit (INDEPENDENT_AMBULATORY_CARE_PROVIDER_SITE_OTHER): Payer: 59

## 2021-03-20 ENCOUNTER — Other Ambulatory Visit: Payer: Self-pay

## 2021-03-20 DIAGNOSIS — N289 Disorder of kidney and ureter, unspecified: Secondary | ICD-10-CM

## 2021-03-20 NOTE — Progress Notes (Signed)
Patient presents to the office for a nurse visit to have labs done to check Renal Function. No questions or concerns at this time. Vitals taken and recorded.

## 2021-03-21 ENCOUNTER — Ambulatory Visit: Payer: 59

## 2021-03-21 LAB — BASIC METABOLIC PANEL WITH GFR
BUN: 17 mg/dL (ref 7–25)
CO2: 28 mmol/L (ref 20–32)
Calcium: 9.4 mg/dL (ref 8.6–10.3)
Chloride: 101 mmol/L (ref 98–110)
Creat: 0.81 mg/dL (ref 0.70–1.33)
GFR, Est African American: 116 mL/min/{1.73_m2} (ref >=60)
GFR, Est Non African American: 100 mL/min/{1.73_m2} (ref >=60)
Glucose, Bld: 129 mg/dL — ABNORMAL HIGH (ref 65–99)
Potassium: 4.3 mmol/L (ref 3.5–5.3)
Sodium: 137 mmol/L (ref 135–146)

## 2021-05-06 ENCOUNTER — Other Ambulatory Visit: Payer: Self-pay | Admitting: Adult Health

## 2021-05-06 ENCOUNTER — Other Ambulatory Visit: Payer: Self-pay | Admitting: Adult Health Nurse Practitioner

## 2021-05-06 DIAGNOSIS — E1169 Type 2 diabetes mellitus with other specified complication: Secondary | ICD-10-CM

## 2021-06-01 NOTE — Progress Notes (Deleted)
3 MONTH FOLLOW UP  Assessment and Plan:   Atherosclerosis of aorta Per CT 2016 Control blood pressure, cholesterol, glucose, increase exercise.   Primary hypertension Continue medication Monitor blood pressure at home; call if consistently over 130/80 Continue DASH diet.   Reminder to go to the ER if any CP, SOB, nausea, dizziness, severe HA, changes vision/speech, left arm numbness and tingling and jaw pain.  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) Improving with ozempic; increase to 1 mg/week *** Titrate down levemir by 2 units if any readings <90 Education: Reviewed 'ABCs' of diabetes management (respective goals in parentheses):  A1C (<7), blood pressure (<130/80), and cholesterol (LDL <70) Dietary recommendations Physical Activity recommendations - Strongly advised to start checking sugars at different times of the day - check 2 times a day, rotating checks ** - given sugar log and advised how to fill it and to bring it at next appt  - given foot care handout and explained the principles  - given instructions for hypoglycemia management  -     COMPLETE METABOLIC PANEL WITH GFR -     Hemoglobin A1c  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  Check lipid panel.  -     Lipid panel -     TSH  Poorly controlled type 2 diabetes mellitus with peripheral neuropathy (HCC) Declines medications at this time, discussed feet checks.  -     Hemoglobin A1c  Idiopathic gout, unspecified chronicity, unspecified site Continue allopurinol Diet discussed Check uric acid annually and as needed  Obesity (BMI 30.0-34.9) *** Long discussion about weight loss, diet, and exercise Recommended diet heavy in fruits and  veggies and low in animal meats, cheeses, and dairy products, appropriate calorie intake Patient will work on high fiber, portions, continue ozempic, perceives benefit with phentermine but will plan to taper/d/c as weight trends down Discussed appropriate weight for height  Follow up at next visit  Vitamin D deficiency At goal at recent check; continue to recommend supplementation for goal of 60-100 Defer vitamin D level    Discussed med's effects and SE's.  Over 30 minutes of exam, counseling, chart review and critical decision making was performed  Future Appointments  Date Time Provider [place]  06/02/2021  9:30 AM Liane Comber, NP [facility] None  09/12/2021 10:30 AM Liane Comber, NP [facility] None  02/21/2022  2:00 PM Liane Comber, NP [facility] None    HPI 55 y.o. male patient presents for 3 month follow up for T2DM, htn, hld, obesity, CKD, gout.    BMI is There is no height or weight on file to calculate BMI., he has been working on diet and exercise. Prescribed phentermine, taking ozempic with benefit, weights trending down. Currently doing lent, holding all pasta, red meat, bread, alcohol, sweets/sodas, down to 2 cups of coffee per day, only doing splenda.   He goes to gym 3 nights a week, also cuts grass.  Wt Readings from Last 3 Encounters:  03/20/21 254 lb (115.2 kg)  02/21/21 246 lb 9.6 oz (111.9 kg)  11/22/20 237 lb (107.5 kg)   He has aortic atherosclerosis per CT abd 2016. He had negative AAA vasc US 03/14/2021 after questionable pulsatile abdominal mass was felt last OV.  His blood pressure has been controlled at home, today their BP is   He does workout. He denies chest pain, shortness of breath, dizziness.   He is on cholesterol medication (atorvastatin 10 mg daily) and denies myalgias. His cholesterol is at goal. The cholesterol last visit was:   Lab Results  Component Value Date   CHOL 109 02/21/2021   HDL 35 (L) 02/21/2021   LDLCALC 54  02/21/2021   TRIG 113 02/21/2021   CHOLHDL 3.1 02/21/2021   He has been working on diet and exercise for previously uncontrolled T2DM, currently treated by metformin 2000 mg daily, levimir 12 units, ozempic was increased to 1 mg/week, no SE, he is on bASA, he is on ACE/ARB and denies hypoglycemia , increased appetite, nausea, paresthesia of the feet, polydipsia, polyuria and visual disturbances.  Fasting running 93-125 recently.  Last A1C in the office was:  Lab Results  Component Value Date   HGBA1C 7.9 (H) 02/21/2021   Last GFR: Lab Results  Component Value Date   GFRNONAA 100 03/20/2021   GFRNONAA 79 02/21/2021   GFRNONAA 107 11/22/2020    Patient is on Vitamin D supplement, taking 15000 IU daily  Lab Results  Component Value Date   VD25OH 44 02/21/2021       Current Medications:  Current Outpatient Medications on File Prior to Visit  Medication Sig Dispense Refill   allopurinol (ZYLOPRIM) 300 MG tablet Take 1 tablet (300 mg total) by mouth daily. 90 tablet 2   aspirin 81 MG tablet Take 81 mg by mouth daily.     atorvastatin (LIPITOR) 10 MG tablet Take 1 tablet (10 mg total) by mouth daily. 90 tablet 2   benazepril (LOTENSIN) 20 MG tablet Take  1  tablet  Daily  for BP & Diabetic Kidney Protection 90 tablet 3   bisoprolol-hydrochlorothiazide (ZIAC) 5-6.25 MG tablet TAKE 1 TABLET DAILY FOR BLOOD PRESSURE 90 tablet 3   Blood Glucose Monitoring Suppl (FREESTYLE LITE) DEVI 1 device to check sugars, patient on insulin 1 each 0   CHOLECALCIFEROL PO Take 15,000 Units by mouth daily.     Continuous Blood Gluc Receiver (FREESTYLE LIBRE 14 DAY READER) DEVI 1 Device by Does not apply route every 14 (fourteen) days. 2 each 4   Continuous Blood Gluc Sensor (FREESTYLE LIBRE 14 DAY SENSOR) MISC 1 Units by Does not apply route every 14 (fourteen) days. 3 each 12   Continuous Blood Gluc Sensor (FREESTYLE LIBRE 14 DAY SENSOR) MISC Apply 1 application topically every 14 (fourteen) days. 2 each 4    fenofibrate micronized (LOFIBRA) 200 MG capsule Take  1 capsule  Daily for Triglycerides  (Blood Fats) 90 capsule 3   glucose blood (FREESTYLE LITE) test strip Test sugar 3 x daily due to hyperglycemia and insulin use. E11.22 100 each 3   insulin detemir (LEVEMIR) 100 UNIT/ML injection Increase to 12 units subcutaneously daily 10 mL 3   Magnesium 500 MG TABS Take by mouth daily.     metFORMIN (GLUCOPHAGE-XR) 500 MG 24 hr tablet Take 2 tablets 2 x/ day for Diabetes 360 tablet 3   Multiple Vitamin (MULTIVITAMIN WITH MINERALS) TABS tablet Take 1 tablet by mouth daily.     phentermine (ADIPEX-P) 37.5 MG tablet TAKE 1 TABLET EVERY MORNING FOR DIETING & WEIGHT LOSS 90 tablet 0   Semaglutide, 1 MG/DOSE, 4 MG/3ML SOPN Inject 1 mg into the skin once a week. 9 mL 1   vitamin B-12 (CYANOCOBALAMIN) 100 MCG tablet Take 100 mcg  by mouth daily.     No current facility-administered medications on file prior to visit.   Allergies:  Allergies  Allergen Reactions   Ppd [Tuberculin Purified Protein Derivative]     + PPD 2010   Welchol [Colesevelam Hcl]     Chest pain   Medical History:  has Hypertension; Hyperlipidemia associated with type 2 diabetes mellitus ([place]); Vitamin D deficiency; Medication management; Type 2 DM with CKD and hypertension ([place]); Gout; CKD stage 1 due to type 2 diabetes mellitus ([place]); Former cigar smoker; Obesity (BMI 30.0-34.9); Poorly controlled type 2 diabetes mellitus with peripheral neuropathy ([place]); and Abdominal aortic atherosclerosis ([place]) on their problem list. Surgical History:  He  has a past surgical history that includes Cystoscopy with retrograde pyelogram, ureteroscopy and stent placement (Right, 02/01/2015) and Holmium laser application (Right, 4/94/4967). Family History:  His family history includes Alzheimer's disease in his father; Asthma in his mother; Diabetes in his brother, father, and mother; Hyperlipidemia in his brother; Hypertension in his brother, father,  and mother; Stroke (age of onset: 84) in his father. Social History:   reports that he quit smoking about 3 years ago. His smoking use included cigars. He has never used smokeless tobacco. He reports current alcohol use of about 2.0 standard drinks of alcohol per week. He reports that he does not use drugs.   Review of Systems:  Review of Systems  Constitutional:  Negative for malaise/fatigue and weight loss.  HENT:  Negative for hearing loss and tinnitus.   Eyes:  Negative for blurred vision and double vision.  Respiratory:  Negative for cough, sputum production, shortness of breath and wheezing.   Cardiovascular:  Negative for chest pain, palpitations, orthopnea, claudication, leg swelling and PND.  Gastrointestinal:  Negative for abdominal pain, blood in stool, constipation, diarrhea, heartburn, melena, nausea and vomiting.  Genitourinary: Negative.   Musculoskeletal:  Negative for falls, joint pain and myalgias.  Skin:  Negative for rash.  Neurological:  Negative for dizziness, tingling, sensory change, weakness and headaches.  Endo/Heme/Allergies:  Negative for polydipsia.  Psychiatric/Behavioral: Negative.  Negative for depression, memory loss, substance abuse and suicidal ideas. The patient is not nervous/anxious and does not have insomnia.   All other systems reviewed and are negative.  Physical Exam: Estimated body mass index is 33.06 kg/m as calculated from the following:   Height as of 02/21/21: 6' 1.5" (1.867 m).   Weight as of 03/20/21: 254 lb (115.2 kg). There were no vitals taken for this visit. General Appearance: Well nourished, in no apparent distress.  Eyes: PERRLA, EOMs, conjunctiva no swelling or erythema Sinuses: No Frontal/maxillary tenderness  ENT/Mouth: Ext aud canals clear, normal light reflex with TMs without erythema, bulging. Good dentition. No erythema, swelling, or exudate on post pharynx. Tonsils not swollen or erythematous. Hearing normal.  Neck: Supple,  thyroid normal. No bruits. He has unchanged soft tissue mass to right anterior neck.  Respiratory: Respiratory effort normal, BS equal bilaterally without rales, rhonchi, wheezing or stridor.  Cardio: RRR without murmurs, rubs or gallops. Brisk peripheral pulses without edema.  Chest: symmetric, with normal excursions and percussion.  Abdomen: Soft, nontender, no guarding, rebound,  masses, or organomegaly. Small ventral hernia with bearing down, pulsatile rippling seen at surface of abdomen; irregular pulsing felt with palpation, irregular.  Lymphatics: Non tender without lymphadenopathy.  Musculoskeletal: Full ROM all peripheral extremities,5/5 strength, and normal gait.  Skin: Warm, dry without rashes, lesions, ecchymosis. Neuro: Cranial nerves intact, reflexes equal bilaterally. Normal muscle tone, no cerebellar symptoms.  Sensation diminished bil feet to monofilament.  Psych: Awake and oriented X 3, normal affect, Insight and Judgment appropriate.    Izora Ribas, NP 2:05 PM [facility]

## 2021-06-02 ENCOUNTER — Ambulatory Visit: Payer: 59 | Admitting: Adult Health

## 2021-06-02 DIAGNOSIS — M1000 Idiopathic gout, unspecified site: Secondary | ICD-10-CM

## 2021-06-02 DIAGNOSIS — I10 Essential (primary) hypertension: Secondary | ICD-10-CM

## 2021-06-02 DIAGNOSIS — E669 Obesity, unspecified: Secondary | ICD-10-CM

## 2021-06-02 DIAGNOSIS — E1122 Type 2 diabetes mellitus with diabetic chronic kidney disease: Secondary | ICD-10-CM

## 2021-06-02 DIAGNOSIS — Z79899 Other long term (current) drug therapy: Secondary | ICD-10-CM

## 2021-06-02 DIAGNOSIS — E1169 Type 2 diabetes mellitus with other specified complication: Secondary | ICD-10-CM

## 2021-06-02 DIAGNOSIS — E1142 Type 2 diabetes mellitus with diabetic polyneuropathy: Secondary | ICD-10-CM

## 2021-06-02 DIAGNOSIS — I700 Atherosclerosis of aorta: Secondary | ICD-10-CM

## 2021-06-02 DIAGNOSIS — E559 Vitamin D deficiency, unspecified: Secondary | ICD-10-CM

## 2021-07-25 ENCOUNTER — Other Ambulatory Visit: Payer: Self-pay | Admitting: Adult Health Nurse Practitioner

## 2021-07-25 DIAGNOSIS — E669 Obesity, unspecified: Secondary | ICD-10-CM

## 2021-08-18 ENCOUNTER — Other Ambulatory Visit: Payer: Self-pay | Admitting: Adult Health Nurse Practitioner

## 2021-08-18 DIAGNOSIS — M1000 Idiopathic gout, unspecified site: Secondary | ICD-10-CM

## 2021-08-18 DIAGNOSIS — E785 Hyperlipidemia, unspecified: Secondary | ICD-10-CM

## 2021-08-18 DIAGNOSIS — E1169 Type 2 diabetes mellitus with other specified complication: Secondary | ICD-10-CM

## 2021-08-18 DIAGNOSIS — E782 Mixed hyperlipidemia: Secondary | ICD-10-CM

## 2021-08-19 ENCOUNTER — Other Ambulatory Visit: Payer: Self-pay | Admitting: Internal Medicine

## 2021-08-23 ENCOUNTER — Other Ambulatory Visit: Payer: Self-pay | Admitting: Adult Health

## 2021-09-07 NOTE — Progress Notes (Signed)
FOLLOW UP 6 MONTH   Assessment and Plan:   Essential hypertension Continue current medications Monitor blood pressure at home; call if consistently over 130/80 Continue DASH diet.   Reminder to go to the ER if any CP, SOB, nausea, dizziness, severe HA, changes vision/speech, left arm numbness and tingling and jaw pain. -     CBC with Diff -     COMPLETE METABOLIC PANEL WITH GFR   Hyperlipidemia associated with type 2 diabetes mellitus (HCC) Continue medications Discussed dietary and exercise modifications Low fat diet -     Lipid Profile  Type 2 DM with CKD stage 2 and hypertension ([place]) Discussed general issues about diabetes pathophysiology and management. Education: Reviewed 'ABCs' of diabetes management (respective goals in parentheses):  A1C (<7), blood pressure (<130/80), and cholesterol (LDL <70) Dietary recommendations Ozempic 1mg  SQ QW continue Encouraged aerobic exercise.  Discussed foot care, check daily Yearly retinal exam Dental exam every 6 months Monitor blood glucose, discussed goal for patient -     Hemoglobin A1c ([facility])  CMP  CKD stage 2 due to type 2 diabetes mellitus (HCC) Continue glucose monitoring Continue medications Increase fluids Avoid NSAIDS Discussed dietary modifications and exercise Will continue to monitor  Poorly controlled type 2 diabetes mellitus with peripheral neuropathy (HCC) See above Checks feet daily  Vitamin D deficiency Continue supplementation Taking Vitamin D 4,000 IU daily -     Vitamin D (25 hydroxy)  Obesity (BMI 30.0-34.9) Discussed dietary and exercise modifications  Medication management Continued  Flu Vaccine Need Flu Vaccine Quad 6+ mos PF IM given  Contusion of nailbed of toe Soak in epson salts, monitor for signs of infection Tdap vaccine given  Continue diet and meds as discussed. Further disposition pending results of labs. Discussed med's effects and SE's.  Patient agrees with plan of care and  opportunity to ask questions/voice concerns. Over 30 minutes of chart review, interview, exam, counseling, and critical decision making was performed.   Future Appointments  Date Time Provider [place]  02/21/2022  2:00 PM Liane Comber, NP [facility] None    ----------------------------------------------------------------------------------------------------------------------  HPI 55 y.o. male  presents for 3 month follow up on HTN, HLD, DMII, gout, weight and vitamin D deficiency.   He states he hit second toe of right foot has a discoloration for about a week, believes he possibly hit it on the couch leg.  Denies pain.  BMI is Body mass index is 32.82 kg/m., he has not been working on diet and exercise. Continues on Ozempic and is trying to eat less carbs, has not been exercising as much.  Wt Readings from Last 3 Encounters:  09/12/21 252 lb 3.2 oz (114.4 kg)  03/20/21 254 lb (115.2 kg)  02/21/21 246 lb 9.6 oz (111.9 kg)    His blood pressure has not been controlled at home, today their BP is BP: 108/60  He does not workout. He denies any cardiac symptoms, chest pains, palpitations, shortness of breath, dizziness or lower extremity edema.     He is on cholesterol medication Atorvastatin and Fenofibrate and denies myalgias.   His cholesterol is not at goal of less than 70. The cholesterol last visit was:   Lab Results  Component Value Date   CHOL 109 02/21/2021   HDL 35 (L) 02/21/2021   LDLCALC 54 02/21/2021   TRIG 113 02/21/2021   CHOLHDL 3.1 02/21/2021    He has not been working on diet and exercise for DMII  Normal kidney function Hyperlipidemia With  neuropathy Blood sugars running [id number] He was on trulicity but it was too expensive- will send in Rx for Ozempic. He is taking Levemir 12 units daily every AM  He is also taking Meformin XR 500mg  two tablets twice a day.  Meter Freestyle- wants to get continuous monitoring.  Eye Exam 10/2020 and denies  polydipsia, polyuria, visual disturbances, vomiting and weight loss.     Last A1C in the office was:  Lab Results  Component Value Date   HGBA1C 7.9 (H) 02/21/2021   Lab Results  Component Value Date   GFRAA 116 03/20/2021   Patient is on Vitamin D supplement for deficiency and taking 4,000IU daily.   Lab Results  Component Value Date   VD25OH 44 02/21/2021       Current Medications:   Current Outpatient Medications (Endocrine & Metabolic):    insulin detemir (LEVEMIR) 100 UNIT/ML injection, Increase to 12 units subcutaneously daily   metFORMIN (GLUCOPHAGE-XR) 500 MG 24 hr tablet, Take 2 tablets 2 x/ day for Diabetes   OZEMPIC, 1 MG/DOSE, 4 MG/3ML SOPN, INJECT 1 MG INTO THE SKIN ONCE A WEEK.  Current Outpatient Medications (Cardiovascular):    atorvastatin (LIPITOR) 10 MG tablet, TAKE 1 TABLET BY MOUTH EVERY DAY   benazepril (LOTENSIN) 20 MG tablet, Take  1  tablet  Daily  for BP & Diabetic Kidney Protection   bisoprolol-hydrochlorothiazide (ZIAC) 5-6.25 MG tablet, TAKE 1 TABLET DAILY FOR BLOOD PRESSURE   fenofibrate micronized (LOFIBRA) 200 MG capsule, Take  1 capsule  Daily for Triglycerides  (Blood Fats)   Current Outpatient Medications (Analgesics):    allopurinol (ZYLOPRIM) 300 MG tablet, TAKE 1 TABLET BY MOUTH EVERY DAY   aspirin 81 MG tablet, Take 81 mg by mouth daily.  Current Outpatient Medications (Hematological):    vitamin B-12 (CYANOCOBALAMIN) 100 MCG tablet, Take 100 mcg by mouth daily.  Current Outpatient Medications (Other):    Magnesium 500 MG TABS, Take by mouth daily.   Multiple Vitamin (MULTIVITAMIN WITH MINERALS) TABS tablet, Take 1 tablet by mouth daily.   phentermine (ADIPEX-P) 37.5 MG tablet, Take  1 tablet Every Morning for Dieting & Weight Loss     /TAKE 1 TABLET BY MOUTH EVERY DAY IN THE MORNING   Blood Glucose Monitoring Suppl (FREESTYLE LITE) DEVI, 1 device to check sugars, patient on insulin   CHOLECALCIFEROL PO, Take 15,000 Units by mouth  daily.   Continuous Blood Gluc Receiver (FREESTYLE LIBRE 14 DAY READER) DEVI, 1 Device by Does not apply route every 14 (fourteen) days.   Continuous Blood Gluc Sensor (FREESTYLE LIBRE 14 DAY SENSOR) MISC, 1 Units by Does not apply route every 14 (fourteen) days.   Continuous Blood Gluc Sensor (FREESTYLE LIBRE 14 DAY SENSOR) MISC, Apply 1 application topically every 14 (fourteen) days.   glucose blood (FREESTYLE LITE) test strip, Test sugar 3 x daily due to hyperglycemia and insulin use. E11.22  Allergies:  Allergies  Allergen Reactions   Ppd [Tuberculin Purified Protein Derivative]     + PPD 2010   Welchol [Colesevelam Hcl]     Chest pain     Medical History:  Past Medical History:  Diagnosis Date   Carpal tunnel syndrome on both sides    Diabetes mellitus type 2, insulin dependent ([place])    Diabetic ulcer of left foot associated with diabetes mellitus due to underlying condition, limited to breakdown of skin ([place]) 03/16/2019   Fatty liver disease, nonalcoholic    Gout    Hyperlipidemia  Hypertension    Kidney stone    Testosterone deficiency 01/25/2014   Vitamin D deficiency     Family history- Reviewed and unchanged Social history- Reviewed and unchanged   Review of Systems:  Review of Systems  Constitutional:  Negative for chills, fever and weight loss.  HENT:  Negative for congestion and hearing loss.   Eyes:  Negative for blurred vision and double vision.  Respiratory:  Negative for cough and shortness of breath.   Cardiovascular:  Negative for chest pain, palpitations, orthopnea and leg swelling.  Gastrointestinal:  Negative for abdominal pain, constipation, diarrhea, heartburn, nausea and vomiting.  Musculoskeletal:  Negative for falls, joint pain and myalgias.  Skin:  Negative for rash.       2nd toe nail black   Neurological:  Negative for dizziness, tingling, tremors, loss of consciousness and headaches.  Psychiatric/Behavioral:  Negative for depression, memory  loss and suicidal ideas.      Physical Exam: BP 108/60   Pulse 70   Temp 97.9 F (36.6 C)   Wt 252 lb 3.2 oz (114.4 kg)   SpO2 99%   BMI 32.82 kg/m  Wt Readings from Last 3 Encounters:  09/12/21 252 lb 3.2 oz (114.4 kg)  03/20/21 254 lb (115.2 kg)  02/21/21 246 lb 9.6 oz (111.9 kg)   General Appearance: Well nourished, in no apparent distress. Eyes: PERRLA, EOMs, conjunctiva no swelling or erythema ENT/Mouth: Ext aud canals clear, TMs without erythema, bulging. Hearing normal.  Neck: Supple, thyroid normal.  Respiratory: Respiratory effort normal, BS equal bilaterally without rales, rhonchi, wheezing or stridor.  Cardio: RRR with no MRGs. Brisk peripheral pulses without edema.  Abdomen: Soft, + BS.  Non tender, no guarding, rebound, hernias, masses. Lymphatics: Non tender without lymphadenopathy.  Musculoskeletal: Full ROM, 5/5 strength, Normal gait Skin: Warm, dry. 2nd toe right foot- nail blackened, ecchymosis to toe Neuro: Cranial nerves intact. No cerebellar symptoms.  Psych: Awake and oriented X 3, normal affect, Insight and Judgment appropriate.     Magda Bernheim ANP-C  [facility]  09/12/2021

## 2021-09-12 ENCOUNTER — Encounter: Payer: Self-pay | Admitting: Nurse Practitioner

## 2021-09-12 ENCOUNTER — Other Ambulatory Visit: Payer: Self-pay

## 2021-09-12 ENCOUNTER — Ambulatory Visit (INDEPENDENT_AMBULATORY_CARE_PROVIDER_SITE_OTHER): Payer: 59 | Admitting: Nurse Practitioner

## 2021-09-12 VITALS — BP 108/60 | HR 70 | Temp 97.9°F | Wt 252.2 lb

## 2021-09-12 DIAGNOSIS — E782 Mixed hyperlipidemia: Secondary | ICD-10-CM

## 2021-09-12 DIAGNOSIS — Z79899 Other long term (current) drug therapy: Secondary | ICD-10-CM

## 2021-09-12 DIAGNOSIS — E669 Obesity, unspecified: Secondary | ICD-10-CM

## 2021-09-12 DIAGNOSIS — S90229A Contusion of unspecified lesser toe(s) with damage to nail, initial encounter: Secondary | ICD-10-CM

## 2021-09-12 DIAGNOSIS — I10 Essential (primary) hypertension: Secondary | ICD-10-CM | POA: Diagnosis not present

## 2021-09-12 DIAGNOSIS — E559 Vitamin D deficiency, unspecified: Secondary | ICD-10-CM

## 2021-09-12 DIAGNOSIS — E1142 Type 2 diabetes mellitus with diabetic polyneuropathy: Secondary | ICD-10-CM

## 2021-09-12 DIAGNOSIS — Z23 Encounter for immunization: Secondary | ICD-10-CM

## 2021-09-12 DIAGNOSIS — E1122 Type 2 diabetes mellitus with diabetic chronic kidney disease: Secondary | ICD-10-CM

## 2021-09-12 DIAGNOSIS — E1165 Type 2 diabetes mellitus with hyperglycemia: Secondary | ICD-10-CM

## 2021-09-12 DIAGNOSIS — N182 Chronic kidney disease, stage 2 (mild): Secondary | ICD-10-CM

## 2021-09-12 DIAGNOSIS — E1169 Type 2 diabetes mellitus with other specified complication: Secondary | ICD-10-CM | POA: Diagnosis not present

## 2021-09-12 MED ORDER — FREESTYLE LITE DEVI: 0 refills | Status: DC

## 2021-09-12 NOTE — Patient Instructions (Signed)
Nail Bed Injury The nail bed is the soft tissue under a fingernail or toenail that is the origin of new nail growth. Various types of injuries can occur at the nail bed. These injuries may cause: Bruising or bleeding under the nail. Cuts (lacerations) in the nail or nail bed. Loss of a part of the nail or the whole nail. This is called avulsion. In some cases, a nail bed injury happens along with another injury, such as a break (fracture) of the bone at the tip of the finger or toe. The nail bed includes the nail matrix, which is the growth center of the nail. If this growth center is damaged, the injured nail may not grow back normally, or it may not grow at all. The regrown nail might have an abnormal shape or appearance. It can take several months for a damaged or torn-off nail to regrow. Depending on the nature and extent of the nail bed injury, the nail may never grow back normally. What are the causes? This condition is usually caused by crushing, pinching, cutting, or tearing injuries of the fingertip or toe. These injuries may occur when a finger or toe gets caught in a door, hit by a hammer, or damaged in accidents involving electrical tools or power machinery. What are the signs or symptoms? Symptoms vary depending on the type of injury. Symptoms may include: Pain in the injured area. Bleeding. Swelling. Discoloration. Collection of blood under the nail (hematoma). Damage to the nail, such as: A deformed or split nail. A loose nail that is not stuck to the nail bed. Loss of all or part of the nail. How is this diagnosed? This condition is diagnosed based on: Your medical history and description of how the injury occurred. A physical exam. Your health care provider will check for a loose nail or a laceration of the nail bed. X-rays. These may be done to see if you have a fracture. Your health care provider might also check for conditions that may affect healing, such as diabetes,  nerve problems, or poor circulation. How is this treated? Treatment for this condition may depend on the type of injury. Sometimes, the injury may not require any treatment other than keeping the area clean and free of infection. Treatment may include: Draining a collection of blood from under the nail. This can be done by making a small hole in the nail. Removing all or part of your nail. This might be necessary in order to stitch (suture) any laceration in the nail bed. Stitching a torn-off nail back in place. Depending on the location and size of the nail bed injury, this is sometimes done to provide temporary protection to the nail bed until the new nail grows in. Applying bandages (dressings) or splints to the area. Taking medicines, such as: Antibiotic medicine to help prevent infection. Pain medicine. Having a tetanus shot. You may need a tetanus shot if: You cannot remember when you had your last tetanus shot. You have never had a tetanus shot. The injury broke your skin and you have not had a tetanus booster during the past 10 years. For certain injuries, your health care provider may tell you to see a hand or foot specialist. Follow these instructions at home: Managing pain, stiffness, and swelling Raise (elevate) the injured area above the level of your heart while you are sitting or lying down. Keep your injury protected with dressings or splints as told by your health care provider. For an injured  toenail: Limit walking on your injured foot. Wear an open-toed shoe when you walk. Try to avoid letting your leg hang down (dangle) while you are sitting or lying down. Wound care Follow any wound care instructions given by your health care provider. Make sure you: Keep the injured area clean, and keep any dressings clean and dry. Wash your hands with soap and water for at least 20 seconds before and after you change any dressing. If soap and water are not available, use hand  sanitizer. Change or remove the dressings only as told by your health care provider. Leave any sutures in place. These may need to stay in place for 2 weeks. Check the injured area every day for signs of infection. Check for: More redness, swelling, or pain. More fluid or blood. Warmth. Pus or a bad smell. General instructions Take over-the-counter and prescription medicines only as told by your health care provider. If you were prescribed an antibiotic medicine, use it as told by your health care provider. Do not stop using the antibiotic even if you start to feel better. Ask your health care provider if the medicine prescribed to you requires you to avoid driving or using machinery. Keep all follow-up visits as told by your health care provider. This is important. Contact a health care provider if: You have pain that is not controlled with medicine. You have more redness, swelling, or pain in the injured area. You have more fluid or blood coming from the injured area. The injured area feels warm to the touch. You have pus or a bad smell coming from the injured area. You have a fever and your symptoms get worse. Get help right away if: You have numbness or your finger or toe turns blue. Summary The nail bed is the soft tissue under a fingernail or toenail that includes the nail matrix, or growth center of the nail. If the growth center is damaged, the injured nail may not grow back normally, or it may not grow at all. Raise (elevate) the injured area above the level of your heart while you are sitting or lying down. Keep any dressings clean and dry. Change or remove your dressings only as told by your health care provider. Take over-the-counter and prescription medicines only as told by your health care provider. This information is not intended to replace advice given to you by your health care provider. Make sure you discuss any questions you have with your health care  provider. Document Revised: 08/14/2019 Document Reviewed: 08/14/2019 Elsevier Patient Education  2022 [employer].

## 2021-09-13 LAB — COMPLETE METABOLIC PANEL WITH GFR
AG Ratio: 1.3 (calc) (ref 1.0–2.5)
ALT: 25 U/L (ref 9–46)
AST: 18 U/L (ref 10–35)
Albumin: 4.4 g/dL (ref 3.6–5.1)
Alkaline phosphatase (APISO): 100 U/L (ref 35–144)
BUN: 22 mg/dL (ref 7–25)
CO2: 28 mmol/L (ref 20–32)
Calcium: 10.5 mg/dL — ABNORMAL HIGH (ref 8.6–10.3)
Chloride: 99 mmol/L (ref 98–110)
Creat: 0.92 mg/dL (ref 0.70–1.30)
Globulin: 3.5 g/dL (calc) (ref 1.9–3.7)
Glucose, Bld: 224 mg/dL — ABNORMAL HIGH (ref 65–99)
Potassium: 4.8 mmol/L (ref 3.5–5.3)
Sodium: 137 mmol/L (ref 135–146)
Total Bilirubin: 0.4 mg/dL (ref 0.2–1.2)
Total Protein: 7.9 g/dL (ref 6.1–8.1)
eGFR: 98 mL/min/{1.73_m2} (ref >=60)

## 2021-09-13 LAB — CBC WITH DIFFERENTIAL/PLATELET
Absolute Monocytes: 585 cells/uL (ref 200–950)
Basophils Absolute: 111 cells/uL (ref 0–200)
Basophils Relative: 1.7 %
Eosinophils Absolute: 234 cells/uL (ref 15–500)
Eosinophils Relative: 3.6 %
HCT: 41.1 % (ref 38.5–50.0)
Hemoglobin: 13.7 g/dL (ref 13.2–17.1)
Lymphs Abs: 2412 cells/uL (ref 850–3900)
MCH: 30.6 pg (ref 27.0–33.0)
MCHC: 33.3 g/dL (ref 32.0–36.0)
MCV: 91.9 fL (ref 80.0–100.0)
MPV: 11.1 fL (ref 7.5–12.5)
Monocytes Relative: 9 %
Neutro Abs: 3159 cells/uL (ref 1500–7800)
Neutrophils Relative %: 48.6 %
Platelets: 283 10*3/uL (ref 140–400)
RBC: 4.47 10*6/uL (ref 4.20–5.80)
RDW: 12.3 % (ref 11.0–15.0)
Total Lymphocyte: 37.1 %
WBC: 6.5 10*3/uL (ref 3.8–10.8)

## 2021-09-13 LAB — LIPID PANEL
Cholesterol: 147 mg/dL (ref <200)
HDL: 32 mg/dL — ABNORMAL LOW (ref >=40)
Non-HDL Cholesterol (Calc): 115 mg/dL (calc) (ref <130)
Total CHOL/HDL Ratio: 4.6 (calc) (ref <5.0)
Triglycerides: 404 mg/dL — ABNORMAL HIGH (ref <150)

## 2021-09-13 LAB — HEMOGLOBIN A1C
Hgb A1c MFr Bld: 9.4 % of total Hgb — ABNORMAL HIGH (ref <5.7)
Mean Plasma Glucose: 223 mg/dL
eAG (mmol/L): 12.4 mmol/L

## 2021-09-13 LAB — TSH: TSH: 2.18 mIU/L (ref 0.40–4.50)

## 2021-09-13 LAB — VITAMIN D 25 HYDROXY (VIT D DEFICIENCY, FRACTURES): Vit D, 25-Hydroxy: >150 ng/mL — ABNORMAL HIGH (ref 30–100)

## 2021-11-07 ENCOUNTER — Other Ambulatory Visit: Payer: Self-pay | Admitting: Nurse Practitioner

## 2021-11-07 NOTE — Telephone Encounter (Signed)
Can you do a prior auth for him

## 2021-11-07 NOTE — Telephone Encounter (Signed)
Have we tried a prior authorization with him?

## 2021-11-10 NOTE — Telephone Encounter (Signed)
Please call the patient to discuss this medication and determine if it is covered but too expensive? Advise him cover my meds states it does not require a prior authorization

## 2021-11-14 ENCOUNTER — Other Ambulatory Visit: Payer: Self-pay | Admitting: Nurse Practitioner

## 2021-11-14 ENCOUNTER — Telehealth: Payer: Self-pay

## 2021-11-14 DIAGNOSIS — E1142 Type 2 diabetes mellitus with diabetic polyneuropathy: Secondary | ICD-10-CM

## 2021-11-14 MED ORDER — TRULICITY 0.75 MG/0.5ML ~~LOC~~ SOAJ: 0.7500 mg | SUBCUTANEOUS | 3 refills | Status: DC

## 2021-11-14 NOTE — Telephone Encounter (Signed)
Ozempic is not covered by his [place] plan. The pharmacist suggested Trulicity?  What do you think?

## 2021-11-14 NOTE — Telephone Encounter (Signed)
Can try to start on this medication but A patient has states they are having a shortage currently. I sent in a trulicity script

## 2021-11-17 ENCOUNTER — Other Ambulatory Visit: Payer: Self-pay | Admitting: Adult Health Nurse Practitioner

## 2021-11-17 DIAGNOSIS — E1122 Type 2 diabetes mellitus with diabetic chronic kidney disease: Secondary | ICD-10-CM

## 2021-11-21 ENCOUNTER — Encounter: Payer: Self-pay | Admitting: Adult Health

## 2021-11-21 ENCOUNTER — Ambulatory Visit (INDEPENDENT_AMBULATORY_CARE_PROVIDER_SITE_OTHER): Payer: Managed Care, Other (non HMO) | Admitting: Adult Health

## 2021-11-21 ENCOUNTER — Other Ambulatory Visit: Payer: Self-pay

## 2021-11-21 VITALS — BP 128/82 | HR 71 | Temp 97.3°F | Wt 257.8 lb

## 2021-11-21 DIAGNOSIS — M1000 Idiopathic gout, unspecified site: Secondary | ICD-10-CM | POA: Diagnosis not present

## 2021-11-21 DIAGNOSIS — N182 Chronic kidney disease, stage 2 (mild): Secondary | ICD-10-CM

## 2021-11-21 DIAGNOSIS — J00 Acute nasopharyngitis [common cold]: Secondary | ICD-10-CM

## 2021-11-21 DIAGNOSIS — R053 Chronic cough: Secondary | ICD-10-CM

## 2021-11-21 DIAGNOSIS — E1122 Type 2 diabetes mellitus with diabetic chronic kidney disease: Secondary | ICD-10-CM

## 2021-11-21 DIAGNOSIS — Z794 Long term (current) use of insulin: Secondary | ICD-10-CM

## 2021-11-21 MED ORDER — LANCETS MISC: 11 refills | Status: DC

## 2021-11-21 MED ORDER — BLOOD GLUCOSE MONITORING SUPPL DEVI: 0 refills | Status: DC

## 2021-11-21 MED ORDER — PROMETHAZINE-DM 6.25-15 MG/5ML PO SYRP: 5.0000 mL | ORAL_SOLUTION | Freq: Four times a day (QID) | ORAL | 1 refills | Status: DC | PRN

## 2021-11-21 MED ORDER — BISOPROLOL-HYDROCHLOROTHIAZIDE 5-6.25 MG PO TABS: 1.0000 | ORAL_TABLET | Freq: Every day | ORAL | 3 refills | Status: DC

## 2021-11-21 MED ORDER — BENAZEPRIL HCL 20 MG PO TABS: ORAL_TABLET | ORAL | 3 refills | Status: DC

## 2021-11-21 MED ORDER — ALLOPURINOL 300 MG PO TABS: 300.0000 mg | ORAL_TABLET | Freq: Every day | ORAL | 2 refills | Status: DC

## 2021-11-21 MED ORDER — FREESTYLE LITE TEST VI STRP: ORAL_STRIP | 3 refills | Status: DC

## 2021-11-21 MED ORDER — ULTRA FLO INSULIN PEN NEEDLES 31G X 5 MM MISC: 2 refills | Status: DC

## 2021-11-21 NOTE — Progress Notes (Signed)
Assessment and Plan:  Itzel was seen today for cough.  Diagnoses and all orders for this visit:  Acute rhinitis Persistent cough Post viral vs vasoactive/allergic rhinitis element Otherwise benign exam, declined CXR today Supect cough persistent secondary to post nasal drip Discussed the importance of avoiding unnecessary antibiotic therapy. Suggested symptomatic OTC remedies. Start daily antihistamine tab Nasal saline spray for congestion. Nasal steroids, nasal decongestant Follow up if persistent and consider short steroid taper -     promethazine-dextromethorphan (PROMETHAZINE-DM) 6.25-15 MG/5ML syrup; Take 5 mLs by mouth 4 (four) times daily as needed for cough.  Idiopathic gout, unspecified chronicity, unspecified site -     allopurinol (ZYLOPRIM) 300 MG tablet; Take 1 tablet (300 mg total) by mouth daily.  Type 2 diabetes mellitus with stage 2 chronic kidney disease, with long-term current use of insulin (HCC) -     glucose blood (FREESTYLE LITE) test strip; Test sugar 3 x daily due to hyperglycemia and insulin use. E11.22 -     Blood Glucose Monitoring Suppl DEVI; Use to test blood sugar once daily -     Lancets MISC; Test blood sugar once daily -     Insulin Pen Needle (ULTRA FLO INSULIN PEN NEEDLES) 31G X 5 MM MISC; Use to inject insulin as directed.  Other orders -     benazepril (LOTENSIN) 20 MG tablet; Take  1  tablet  Daily  for BP & Diabetic Kidney Protection -     bisoprolol-hydrochlorothiazide (ZIAC) 5-6.25 MG tablet; Take 1 tablet by mouth daily. for blood pressure   Further disposition pending results of labs. Discussed med's effects and SE's.   Over 30 minutes of exam, counseling, chart review, and critical decision making was performed.   Future Appointments  Date Time Provider [place]  02/21/2022  2:00 PM Magda Bernheim, NP [facility] None     ------------------------------------------------------------------------------------------------------------------   HPI BP 128/82    Pulse 71    Temp (!) 97.3 F (36.3 C)    Wt 257 lb 12.8 oz (116.9 kg)    SpO2 99%    BMI 33.55 kg/m  56 y.o.male presents for evaluation of persistent cough following URI. Also needing several med refills.   He reports around 11/03/21 developed body aches, chills, fever, sore throat, cough, headache, did not test but presumed flu due to several + in his office, he did test negative for covid 19. He reports sx lasted 3 days and mostly resolved except for the cough.   He reports cough is worst in AM and in the evening, correlates more with cool temperature, describes as deep chest cough, is scantly productive of clear/yellow phlegm. A few coughing episodes overnight that will wake him up. He does endorse sense of post nasal drip. Will come in fits, will cough for 5-10 min at a time.   He reports has taken saline nasal spray twice daily, has taken halls drops for cough with mild benefit. Reports was having watery/itchy eyes in the last week, wife got him "allergy eye drops" which resolved sx.    Past Medical History:  Diagnosis Date   Carpal tunnel syndrome on both sides    Diabetes mellitus type 2, insulin dependent ([place])    Diabetic ulcer of left foot associated with diabetes mellitus due to underlying condition, limited to breakdown of skin ([place]) 03/16/2019   Fatty liver disease, nonalcoholic    Gout    Hyperlipidemia    Hypertension    Kidney stone    Testosterone deficiency  01/25/2014   Vitamin D deficiency      Allergies  Allergen Reactions   Ppd [Tuberculin Purified Protein Derivative]     + PPD 2010   Welchol [Colesevelam Hcl]     Chest pain    Current Outpatient Medications on File Prior to Visit  Medication Sig   allopurinol (ZYLOPRIM) 300 MG tablet TAKE 1 TABLET BY MOUTH EVERY DAY   aspirin 81 MG tablet Take 81 mg by mouth daily.    atorvastatin (LIPITOR) 10 MG tablet TAKE 1 TABLET BY MOUTH EVERY DAY   benazepril (LOTENSIN) 20 MG tablet Take  1  tablet  Daily  for BP & Diabetic Kidney Protection   bisoprolol-hydrochlorothiazide (ZIAC) 5-6.25 MG tablet TAKE 1 TABLET DAILY FOR BLOOD PRESSURE   Blood Glucose Monitoring Suppl (FREESTYLE LITE) DEVI 1 device to check sugars, patient on insulin   CHOLECALCIFEROL PO Take 15,000 Units by mouth daily.   Continuous Blood Gluc Receiver (FREESTYLE LIBRE 14 DAY READER) DEVI 1 Device by Does not apply route every 14 (fourteen) days.   Continuous Blood Gluc Sensor (FREESTYLE LIBRE 14 DAY SENSOR) MISC 1 Units by Does not apply route every 14 (fourteen) days.   Continuous Blood Gluc Sensor (FREESTYLE LIBRE 14 DAY SENSOR) MISC Apply 1 application topically every 14 (fourteen) days.   Dulaglutide (TRULICITY) 0.96 GE/3.6OQ SOPN Inject 0.75 mg into the skin once a week.   fenofibrate micronized (LOFIBRA) 200 MG capsule Take  1 capsule  Daily for Triglycerides  (Blood Fats)   glucose blood (FREESTYLE LITE) test strip Test sugar 3 x daily due to hyperglycemia and insulin use. E11.22   insulin detemir (LEVEMIR) 100 UNIT/ML injection Increase to 12 units subcutaneously daily   Magnesium 500 MG TABS Take by mouth daily.   metFORMIN (GLUCOPHAGE-XR) 500 MG 24 hr tablet TAKE 2 TABLETS TWICE A DAY FOR DIABETES   Multiple Vitamin (MULTIVITAMIN WITH MINERALS) TABS tablet Take 1 tablet by mouth daily.   phentermine (ADIPEX-P) 37.5 MG tablet Take  1 tablet Every Morning for Dieting & Weight Loss     /TAKE 1 TABLET BY MOUTH EVERY DAY IN THE MORNING   vitamin B-12 (CYANOCOBALAMIN) 100 MCG tablet Take 100 mcg by mouth daily.   No current facility-administered medications on file prior to visit.    ROS: all negative except above.   Physical Exam:  BP 128/82    Pulse 71    Temp (!) 97.3 F (36.3 C)    Wt 257 lb 12.8 oz (116.9 kg)    SpO2 99%    BMI 33.55 kg/m   General Appearance: Well nourished, in no  apparent distress. Eyes: PERRLA, EOMs, conjunctiva no swelling or erythema Sinuses: No Frontal/maxillary tenderness ENT/Mouth: Ext aud canals clear, TMs without erythema, bulging. No erythema, swelling, or exudate on post pharynx.  Tonsils not swollen or erythematous. Hearing normal. No cough for duration of visit.  Neck: Supple Respiratory: Respiratory effort normal, BS equal bilaterally without rales, rhonchi, wheezing or stridor.  Cardio: RRR with no MRGs. Brisk peripheral pulses without edema.  Abdomen: Soft, + BS.  Non tender Lymphatics: Non tender without lymphadenopathy.  Musculoskeletal: No deformity, normal gait.  Skin: Warm, dry without rashes, lesions, ecchymosis.  Neuro: Normal muscle tone Psych: Awake and oriented X 3, normal affect, Insight and Judgment appropriate.     Izora Ribas, NP 11:03 AM [facility]

## 2021-11-21 NOTE — Patient Instructions (Addendum)
° ° °  Please start on allegra (fexofenadine) 180 mg once daily for allergies   Stop allergy eye drops, add hydrating eye drops instead if needed   - Nasonex/flonase nasal spray - phenylephrine nasal spray (neo-synephrine drops)   Nasal congestion Little Remedies saline spray (aerosol/mist)- can try this, it is in the kids section - pseudoephedrine (Sudafed)- behind the counter, do not use if you have high blood pressure, medicine that have -D in them. - phenylephrine nasal spray (neo-synephrine drops) -Dextormethorphan + chlorpheniramine (Coridcidin HBP)- okay if you have high blood pressure -Oxymetazoline (Afrin) nasal spray- LIMIT to 3 days -Saline nasal spray -Neti pot (used distilled or bottled water) - Nasonex/flonase nasal spray  Ear pain/congestion -pseudoephedrine (sudafed) - Nasonex/flonase nasal spray  Fever -Acetaminophen (Tyelnol) -Ibuprofen (Advil, motrin, aleve)  Sore Throat -Acetaminophen (Tyelnol) -Ibuprofen (Advil, motrin, aleve) -Drink a lot of water -Gargle with salt water - Rest your voice (don't talk) -Throat sprays -Cough drops  Body Aches -Acetaminophen (Tyelnol) -Ibuprofen (Advil, motrin, aleve)  Headache -Acetaminophen (Tyelnol) -Ibuprofen (Advil, motrin, aleve) - Exedrin, Exedrin Migraine  Allergy symptoms (cough, sneeze, runny nose, itchy eyes) -Claritin or loratadine cheapest but likely the weakest -Zyrtec or certizine at night because it can make you sleepy -The strongest is allegra or fexafinadine Cheapest at [facility], [facility], [facility]  Cough -Dextromethorphan (Delsym)- medicine that has DM in it -Guafenesin (Mucinex/Robitussin) - cough drops - drink lots of water  Chest Congestion -Guafenesin (Mucinex/Robitussin)  Red Itchy Eyes - Naphcon-A

## 2021-12-01 ENCOUNTER — Other Ambulatory Visit: Payer: Self-pay | Admitting: Adult Health

## 2021-12-01 DIAGNOSIS — E1169 Type 2 diabetes mellitus with other specified complication: Secondary | ICD-10-CM

## 2021-12-01 DIAGNOSIS — E782 Mixed hyperlipidemia: Secondary | ICD-10-CM

## 2021-12-11 ENCOUNTER — Telehealth: Payer: Self-pay

## 2021-12-11 NOTE — Telephone Encounter (Signed)
Prior Auth for Trulicity approved through 12/03/22.

## 2021-12-18 ENCOUNTER — Other Ambulatory Visit: Payer: Self-pay | Admitting: Adult Health

## 2021-12-18 DIAGNOSIS — N182 Chronic kidney disease, stage 2 (mild): Secondary | ICD-10-CM

## 2021-12-18 DIAGNOSIS — E1122 Type 2 diabetes mellitus with diabetic chronic kidney disease: Secondary | ICD-10-CM

## 2022-01-19 ENCOUNTER — Other Ambulatory Visit: Payer: Self-pay | Admitting: Internal Medicine

## 2022-01-19 DIAGNOSIS — E669 Obesity, unspecified: Secondary | ICD-10-CM

## 2022-02-15 ENCOUNTER — Other Ambulatory Visit: Payer: Self-pay | Admitting: Adult Health

## 2022-02-15 ENCOUNTER — Telehealth: Payer: Self-pay | Admitting: Adult Health

## 2022-02-15 DIAGNOSIS — E1122 Type 2 diabetes mellitus with diabetic chronic kidney disease: Secondary | ICD-10-CM

## 2022-02-15 MED ORDER — METFORMIN HCL ER 500 MG PO TB24: ORAL_TABLET | ORAL | 3 refills | Status: DC

## 2022-02-15 NOTE — Telephone Encounter (Signed)
Pt recently changed his pharmacy from [facility] to [facility], and is needing [place] to send a refill of Metformin please.  ?

## 2022-02-21 ENCOUNTER — Encounter: Payer: 59 | Admitting: Nurse Practitioner

## 2022-02-26 NOTE — Progress Notes (Signed)
Complete Physical ? ?Assessment and Plan: ? ?Arthur Hudson was seen today for annual exam. ? ?Diagnoses and all orders for this visit: ? ?Encounter for routine adult health examination without abnormal findings ?Due annually  ? ?Atherosclerosis of aorta ?Per CT 2016 ?Control blood pressure, cholesterol, glucose, increase exercise.  ? ?Primary hypertension ?Continue medication ?Monitor blood pressure at home; call if consistently over 130/80 ?Continue DASH diet.   ?Reminder to go to the ER if any CP, SOB, nausea, dizziness, severe HA, changes vision/speech, left arm numbness and tingling and jaw pain. ?-     CBC with Differential/Platelet ?-     COMPLETE METABOLIC PANEL WITH GFR ?-     Magnesium ?-     TSH ?-     Microalbumin / creatinine urine ratio ?-     Urinalysis, Routine w reflex microscopic ?-     EKG 12-Lead ? ?Type 2 DM with CKD and hypertension ([place]) ?Increase Trulicity to 1.5 mg SQ QW ?Titrate down levemir by 2 units if any readings <90 ?Education: Reviewed ?ABCs? of diabetes management (respective goals in parentheses):  A1C (<7), blood pressure (<130/80), and cholesterol (LDL <70) ?Eye Exam yearly and Dental Exam every 6 months. ?Dietary recommendations ?Physical Activity recommendations ?- Strongly advised to start checking sugars at different times of the day - check 2 times a day, rotating checks ?- given sugar log and advised how to fill it and to bring it at next appt  ?- given foot care handout and explained the principles  ?- given instructions for hypoglycemia management  ?-     COMPLETE METABOLIC PANEL WITH GFR ?-     Hemoglobin A1c ?-     Microalbumin / creatinine urine ratio ?-     Urinalysis, Routine w reflex microscopic ? ?CKD stage 2 due to type 2 diabetes mellitus ([place]) ?Increase fluids, avoid NSAIDS, monitor sugars, will monitor ?Continue ACEi/ARB ?-     COMPLETE METABOLIC PANEL WITH GFR ?-     Microalbumin / creatinine urine ratio ?-     Urinalysis, Routine w reflex  microscopic ? ?Hyperlipidemia associated with type 2 diabetes mellitus ([place]) ?Continue medications ?LDL goal <70 ?Continue low cholesterol diet and exercise.  ?Check lipid panel.  ?-     Lipid panel ?-     TSH ? ?Poorly controlled type 2 diabetes mellitus with peripheral neuropathy ([place]) ?Declines medications at this time, discussed feet checks.  ?-     Hemoglobin A1c ?-     HM DIABETES FOOT EXAM ? ?Idiopathic gout, unspecified chronicity, unspecified site ?Continue allopurinol ?Diet discussed ? ? ?Obesity (BMI 30.0-34.9) ?Long discussion about weight loss, diet, and exercise ?Recommended diet heavy in fruits and veggies and low in animal meats, cheeses, and dairy products, appropriate calorie intake ?Patient will work on high fiber, portions, continue ozempic, perceives benefit with phentermine but will plan to taper/d/c as weight trends down ?Discussed appropriate weight for height  ?Follow up at next visit ? ? ?Vitamin D deficiency ?-     VITAMIN D 25 Hydroxy (Vit-D Deficiency, Fractures) ? ?Screening for hematuria or proteinuria ?-     Microalbumin / creatinine urine ratio ?-     Urinalysis, Routine w reflex microscopic ? ?Screening for cardiovascular condition ?-     EKG 12-Lead ? ?Screening for prostate cancer ?-     PSA ? ? ? ? ? ?Discussed med's effects and SE's. Screening labs and tests as requested with regular follow-up as recommended. ?Over 40 minutes of exam, counseling, chart  review and critical decision making was performed ? ?Future Appointments  ?Date Time Provider [place]  ?02/28/2023  2:00 PM Aiyah Scarpelli, Townsend Roger, NP [facility] None  ? ? ?HPI ?56 y.o. male patient presents for a complete physical. He has Hypertension; Hyperlipidemia associated with type 2 diabetes mellitus ([place]); Vitamin D deficiency; Medication management; Type 2 DM with CKD and hypertension; Gout; CKD stage 1 due to type 2 diabetes mellitus ([place]); Former cigar smoker; Obesity (BMI 30.0-34.9); Poorly controlled type 2 diabetes  mellitus with peripheral neuropathy ([place]); and Abdominal aortic atherosclerosis ([place]) on their problem list. ? ?He is married, grown daughter, 1 grandson just turned 4. He works as a Training and development officer.  ?No concerns today.  ? ?He is no longer smoking cigars, quit 2019.  ? ?BMI is Body mass index is 34.46 kg/m?., he has been working on diet and exercise. Prescribed phentermine, weights trending down.  ?He has not been exercising as much ?Wt Readings from Last 3 Encounters:  ?02/27/22 261 lb 3.2 oz (118.5 kg)  ?11/21/21 257 lb 12.8 oz (116.9 kg)  ?09/12/21 252 lb 3.2 oz (114.4 kg)  ? ?He has aortic atherosclerosis per CT abd 2016 ? ?His blood pressure has been controlled at home, today their BP is BP: 114/68 ?BP Readings from Last 3 Encounters:  ?02/27/22 114/68  ?11/21/21 128/82  ?09/12/21 108/60  ?He does workout. He denies chest pain, shortness of breath, dizziness.  ? ? ?He is on cholesterol medication (atorvastatin 10 mg daily) and denies myalgias. His cholesterol is not at goal. The cholesterol last visit was:   ?Lab Results  ?Component Value Date  ? CHOL 147 09/12/2021  ? HDL 32 (L) 09/12/2021  ? [place]  09/12/2021  ?   Comment:  ?   . ?LDL cholesterol not calculated. Triglyceride levels ?greater than 400 mg/dL invalidate calculated LDL results. ?. ?Reference range: <100 ?Marland Kitchen ?Desirable range <100 mg/dL for primary prevention;   ?<70 mg/dL for patients with CHD or diabetic patients  ?with > or = 2 CHD risk factors. ?. ?LDL-C is now calculated using the Martin-Hopkins  ?calculation, which is a validated novel method providing  ?better accuracy than the Friedewald equation in the  ?estimation of LDL-C.  ?[name] et al. Annamaria Helling. 5465;035(46): 2061-2068  ?(http://education.[employer].com/faq/FAQ164) ?  ? TRIG 404 (H) 09/12/2021  ? CHOLHDL 4.6 09/12/2021  ? ?He has been working on diet and exercise for uncontrolled T2DM, currently treated by metformin 2000 mg daily, levimir 18 units, trulicity 5.68 mg no SE, he is on bASA,  he is on ACE/ARB and denies hypoglycemia , increased appetite, nausea,  polydipsia, polyuria and visual disturbances. Has noticed occasional tingling in feet and hands. taking ozempic with benefit- insurance no longer covered so had to change to Trulicity and blood sugars have increased with current dose ?Fasting running 140-189 recently.  ?Last A1C in the office was:  ?Lab Results  ?Component Value Date  ? HGBA1C 9.4 (H) 09/12/2021  ? ?Last GFR: ?Lab Results  ?Component Value Date  ? GFRNONAA 100 03/20/2021  ? GFRNONAA 79 02/21/2021  ? GFRNONAA 107 11/22/2020  ? ? Patient is on Vitamin D supplement, taking 15000 IU daily  ?Lab Results  ?Component Value Date  ? VD25OH >150 (H) 09/12/2021  ?   ?Denies noctura or LUTs. Last PSA was: ?Lab Results  ?Component Value Date  ? PSA 0.27 02/21/2021  ? ? ? ? ?Current Medications:  ?Current Outpatient Medications on File Prior to Visit  ?Medication Sig Dispense Refill  ?  allopurinol (ZYLOPRIM) 300 MG tablet Take 1 tablet (300 mg total) by mouth daily. 90 tablet 2  ? aspirin 81 MG tablet Take 81 mg by mouth daily.    ? atorvastatin (LIPITOR) 10 MG tablet TAKE 1 TABLET BY MOUTH EVERY DAY 90 tablet 2  ? benazepril (LOTENSIN) 20 MG tablet Take  1  tablet  Daily  for BP & Diabetic Kidney Protection 90 tablet 3  ? bisoprolol-hydrochlorothiazide (ZIAC) 5-6.25 MG tablet Take 1 tablet by mouth daily. for blood pressure 90 tablet 3  ? CHOLECALCIFEROL PO Take 15,000 Units by mouth daily.    ? Dulaglutide (TRULICITY) 8.25 OI/3.7CW SOPN Inject 0.75 mg into the skin once a week. 2 mL 3  ? fenofibrate micronized (LOFIBRA) 200 MG capsule TAKE 1 CAPSULE BY MOUTH EVERY DAY 90 capsule 3  ? insulin detemir (LEVEMIR) 100 UNIT/ML injection Increase to 12 units subcutaneously daily 10 mL 3  ? Magnesium 500 MG TABS Take by mouth daily.    ? metFORMIN (GLUCOPHAGE-XR) 500 MG 24 hr tablet TAKE 2 TABLETS TWICE A DAY FOR DIABETES 360 tablet 3  ? Multiple Vitamin (MULTIVITAMIN WITH MINERALS) TABS tablet  Take 1 tablet by mouth daily.    ? phentermine (ADIPEX-P) 37.5 MG tablet TAKE 1 TABLET EVERY MORNING FOR DIETING & WEIGHT LOSS /TAKE 1 TABLET BY MOUTH EVERY DAY IN THE MORNING 90 tablet 1  ? vitamin B-12 (CYANOCOBALAMIN) 100 MCG tab

## 2022-02-27 ENCOUNTER — Ambulatory Visit (INDEPENDENT_AMBULATORY_CARE_PROVIDER_SITE_OTHER): Payer: Managed Care, Other (non HMO) | Admitting: Nurse Practitioner

## 2022-02-27 ENCOUNTER — Encounter: Payer: Self-pay | Admitting: Nurse Practitioner

## 2022-02-27 VITALS — BP 114/68 | HR 69 | Temp 97.5°F | Ht 73.0 in | Wt 261.2 lb

## 2022-02-27 DIAGNOSIS — Z0000 Encounter for general adult medical examination without abnormal findings: Secondary | ICD-10-CM | POA: Diagnosis not present

## 2022-02-27 DIAGNOSIS — E1122 Type 2 diabetes mellitus with diabetic chronic kidney disease: Secondary | ICD-10-CM

## 2022-02-27 DIAGNOSIS — Z79899 Other long term (current) drug therapy: Secondary | ICD-10-CM

## 2022-02-27 DIAGNOSIS — Z1389 Encounter for screening for other disorder: Secondary | ICD-10-CM

## 2022-02-27 DIAGNOSIS — N401 Enlarged prostate with lower urinary tract symptoms: Secondary | ICD-10-CM | POA: Diagnosis not present

## 2022-02-27 DIAGNOSIS — Z131 Encounter for screening for diabetes mellitus: Secondary | ICD-10-CM

## 2022-02-27 DIAGNOSIS — E559 Vitamin D deficiency, unspecified: Secondary | ICD-10-CM

## 2022-02-27 DIAGNOSIS — Z125 Encounter for screening for malignant neoplasm of prostate: Secondary | ICD-10-CM

## 2022-02-27 DIAGNOSIS — R350 Frequency of micturition: Secondary | ICD-10-CM | POA: Diagnosis not present

## 2022-02-27 DIAGNOSIS — E1169 Type 2 diabetes mellitus with other specified complication: Secondary | ICD-10-CM

## 2022-02-27 DIAGNOSIS — E1165 Type 2 diabetes mellitus with hyperglycemia: Secondary | ICD-10-CM

## 2022-02-27 DIAGNOSIS — Z136 Encounter for screening for cardiovascular disorders: Secondary | ICD-10-CM | POA: Diagnosis not present

## 2022-02-27 DIAGNOSIS — M1000 Idiopathic gout, unspecified site: Secondary | ICD-10-CM

## 2022-02-27 DIAGNOSIS — Z13220 Encounter for screening for lipoid disorders: Secondary | ICD-10-CM | POA: Diagnosis not present

## 2022-02-27 DIAGNOSIS — I10 Essential (primary) hypertension: Secondary | ICD-10-CM

## 2022-02-27 DIAGNOSIS — E1142 Type 2 diabetes mellitus with diabetic polyneuropathy: Secondary | ICD-10-CM

## 2022-02-27 DIAGNOSIS — I700 Atherosclerosis of aorta: Secondary | ICD-10-CM

## 2022-02-27 MED ORDER — TRULICITY 1.5 MG/0.5ML ~~LOC~~ SOAJ: 1.5000 mg | SUBCUTANEOUS | 3 refills | Status: DC

## 2022-02-28 ENCOUNTER — Other Ambulatory Visit: Payer: Self-pay | Admitting: Nurse Practitioner

## 2022-02-28 DIAGNOSIS — E1169 Type 2 diabetes mellitus with other specified complication: Secondary | ICD-10-CM

## 2022-02-28 LAB — CBC WITH DIFFERENTIAL/PLATELET
Absolute Monocytes: 707 cells/uL (ref 200–950)
Basophils Absolute: 98 cells/uL (ref 0–200)
Basophils Relative: 1.4 %
Eosinophils Absolute: 287 cells/uL (ref 15–500)
Eosinophils Relative: 4.1 %
HCT: 39.9 % (ref 38.5–50.0)
Hemoglobin: 13.5 g/dL (ref 13.2–17.1)
Lymphs Abs: 2499 cells/uL (ref 850–3900)
MCH: 31 pg (ref 27.0–33.0)
MCHC: 33.8 g/dL (ref 32.0–36.0)
MCV: 91.5 fL (ref 80.0–100.0)
MPV: 11.6 fL (ref 7.5–12.5)
Monocytes Relative: 10.1 %
Neutro Abs: 3409 cells/uL (ref 1500–7800)
Neutrophils Relative %: 48.7 %
Platelets: 247 10*3/uL (ref 140–400)
RBC: 4.36 10*6/uL (ref 4.20–5.80)
RDW: 12.6 % (ref 11.0–15.0)
Total Lymphocyte: 35.7 %
WBC: 7 10*3/uL (ref 3.8–10.8)

## 2022-02-28 LAB — URINALYSIS, ROUTINE W REFLEX MICROSCOPIC
Bilirubin Urine: NEGATIVE
Hgb urine dipstick: NEGATIVE
Ketones, ur: NEGATIVE
Leukocytes,Ua: NEGATIVE
Nitrite: NEGATIVE
Protein, ur: NEGATIVE
Specific Gravity, Urine: 1.023 (ref 1.001–1.035)
pH: 5.5 (ref 5.0–8.0)

## 2022-02-28 LAB — COMPLETE METABOLIC PANEL WITH GFR
AG Ratio: 1.3 (calc) (ref 1.0–2.5)
ALT: 23 U/L (ref 9–46)
AST: 18 U/L (ref 10–35)
Albumin: 4.5 g/dL (ref 3.6–5.1)
Alkaline phosphatase (APISO): 131 U/L (ref 35–144)
BUN: 17 mg/dL (ref 7–25)
CO2: 26 mmol/L (ref 20–32)
Calcium: 10.2 mg/dL (ref 8.6–10.3)
Chloride: 100 mmol/L (ref 98–110)
Creat: 0.99 mg/dL (ref 0.70–1.30)
Globulin: 3.4 g/dL (calc) (ref 1.9–3.7)
Glucose, Bld: 231 mg/dL — ABNORMAL HIGH (ref 65–99)
Potassium: 4.3 mmol/L (ref 3.5–5.3)
Sodium: 137 mmol/L (ref 135–146)
Total Bilirubin: 0.3 mg/dL (ref 0.2–1.2)
Total Protein: 7.9 g/dL (ref 6.1–8.1)
eGFR: 89 mL/min/{1.73_m2} (ref >=60)

## 2022-02-28 LAB — MAGNESIUM: Magnesium: 1.8 mg/dL (ref 1.5–2.5)

## 2022-02-28 LAB — LIPID PANEL
Cholesterol: 147 mg/dL (ref <200)
HDL: 31 mg/dL — ABNORMAL LOW (ref >=40)
Non-HDL Cholesterol (Calc): 116 mg/dL (calc) (ref <130)
Total CHOL/HDL Ratio: 4.7 (calc) (ref <5.0)
Triglycerides: 509 mg/dL — ABNORMAL HIGH (ref <150)

## 2022-02-28 LAB — TSH: TSH: 1.98 mIU/L (ref 0.40–4.50)

## 2022-02-28 LAB — VITAMIN D 25 HYDROXY (VIT D DEFICIENCY, FRACTURES): Vit D, 25-Hydroxy: 40 ng/mL (ref 30–100)

## 2022-02-28 LAB — HEMOGLOBIN A1C
Hgb A1c MFr Bld: 10.3 % of total Hgb — ABNORMAL HIGH (ref <5.7)
Mean Plasma Glucose: 249 mg/dL
eAG (mmol/L): 13.8 mmol/L

## 2022-02-28 LAB — PSA: PSA: 0.21 ng/mL (ref <=4.00)

## 2022-02-28 LAB — MICROALBUMIN / CREATININE URINE RATIO
Creatinine, Urine: 57 mg/dL (ref 20–320)
Microalb Creat Ratio: 49 mcg/mg creat — ABNORMAL HIGH (ref <30)
Microalb, Ur: 2.8 mg/dL

## 2022-02-28 MED ORDER — ROSUVASTATIN CALCIUM 10 MG PO TABS: 10.0000 mg | ORAL_TABLET | Freq: Every day | ORAL | 11 refills | Status: DC

## 2022-03-07 ENCOUNTER — Telehealth: Payer: Self-pay

## 2022-03-07 NOTE — Telephone Encounter (Signed)
Prior auth for Trulicity approved through 03/02/23 ?

## 2022-05-15 ENCOUNTER — Other Ambulatory Visit: Payer: Self-pay | Admitting: Adult Health

## 2022-05-15 DIAGNOSIS — M1000 Idiopathic gout, unspecified site: Secondary | ICD-10-CM

## 2022-05-28 NOTE — Progress Notes (Unsigned)
FOLLOW UP 3 MONTH   Assessment and Plan:   Essential hypertension Continue current medications Monitor blood pressure at home; call if consistently over 130/80 Continue DASH diet.   Reminder to go to the ER if any CP, SOB, nausea, dizziness, severe HA, changes vision/speech, left arm numbness and tingling and jaw pain. -     CBC with Diff -     COMPLETE METABOLIC PANEL WITH GFR   Hyperlipidemia associated with type 2 diabetes mellitus (HCC) Continue medications Discussed dietary and exercise modifications Low fat diet -     Lipid Profile  Type 2 DM with CKD stage 2 and hypertension ([place]) Discussed general issues about diabetes pathophysiology and management. Education: Reviewed 'ABCs' of diabetes management (respective goals in parentheses):  A1C (<7), blood pressure (<130/80), and cholesterol (LDL <70) Dietary recommendations Trulicity 1.5 mg SQ QW Levemir 20 units daily Not checking blood sugar- strongly encouraged to check daily Encouraged aerobic exercise.  Discussed foot care, check daily Yearly retinal exam Dental exam every 6 months Monitor blood glucose, discussed goal for patient -     Hemoglobin A1c ([facility])  CMP  CKD stage 2 due to type 2 diabetes mellitus (HCC) Continue glucose monitoring Continue medications Increase fluids Avoid NSAIDS Discussed dietary modifications and exercise Will continue to monitor  Abdominal Aortic Atherosclerosis(HCC) Monitor and control blood pressure, blood sugars, cholesterol and weight  Poorly controlled type 2 diabetes mellitus with peripheral neuropathy (HCC) Strongly encourage to check blood sugars daily Referred to podiatry Checks feet daily  Type 2 Diabetes with Pressure callus(HCC) Refer to podiatry Wear well fitting shoes and keep feet clean and dry- do not open blister  Vitamin D deficiency Continue supplementation Taking Vitamin D 4,000 IU daily   Obesity (BMI 30.0-34.9) Discussed dietary and exercise  modifications  Medication management Continued  Colon Cancer Screening - Refer to GI   Continue diet and meds as discussed. Further disposition pending results of labs. Discussed med's effects and SE's.  Patient agrees with plan of care and opportunity to ask questions/voice concerns. Over 30 minutes of chart review, interview, exam, counseling, and critical decision making was performed.   Future Appointments  Date Time Provider [place]  02/28/2023  2:00 PM Alycia Rossetti, NP [facility] None    ----------------------------------------------------------------------------------------------------------------------  HPI 56 y.o. male  presents for 3 month follow up on HTN, HLD, DMII, gout, weight and vitamin D deficiency.   He has a callus/blister area on his left great toe which occurred with new shoes x 3 weeks.   Area is not painful.  He does have tingling in both feet does not go up his legs.   BMI is Body mass index is 33.83 kg/m., he has not been working on diet and exercise. Continues on Trulicity 1.5 mg SQ Aquilla Hacker is trying to eat less carbs, has not been exercising as much.  Wt Readings from Last 3 Encounters:  05/29/22 256 lb 6.4 oz (116.3 kg)  02/27/22 261 lb 3.2 oz (118.5 kg)  11/21/21 257 lb 12.8 oz (116.9 kg)    His blood pressure has not been controlled at home, today their BP is BP: 122/62  BP Readings from Last 3 Encounters:  05/29/22 122/62  02/27/22 114/68  11/21/21 128/82  He does not workout. He denies any cardiac symptoms, chest pains, palpitations, shortness of breath, dizziness or lower extremity edema.      He is on cholesterol medication , Rosuvastatin 10 mg QD and fenofibrate 200 mg QD, and denies myalgias.  His cholesterol is not at goal of less than 70. The cholesterol last visit was:   Lab Results  Component Value Date   CHOL 147 02/27/2022   HDL 31 (L) 02/27/2022   [place]  02/27/2022     Comment:     . LDL cholesterol not  calculated. Triglyceride levels greater than 400 mg/dL invalidate calculated LDL results. . Reference range: <100 . Desirable range <100 mg/dL for primary prevention;   <70 mg/dL for patients with CHD or diabetic patients  with > or = 2 CHD risk factors. Marland Kitchen LDL-C is now calculated using the Martin-Hopkins  calculation, which is a validated novel method providing  better accuracy than the Friedewald equation in the  estimation of LDL-C.  [name] et al. Annamaria Helling. 8546;270(35): 2061-2068  (http://education.[employer].com/faq/FAQ164)    TRIG 509 (H) 02/27/2022   CHOLHDL 4.7 02/27/2022    He has not been working on diet and exercise for DMII - he has been eating more carbs.  Normal kidney function Hyperlipidemia With neuropathy Has not been checking his blood sugars He is on Trulicity 1.5 mg  He is taking Levemir 20 units daily every AM  He is also taking Meformin XR 555m two tablets twice a day.  Meter Freestyle- wants to get continuous monitoring.  Eye Exam 10/2020 and denies polydipsia, polyuria, visual disturbances, vomiting and weight loss.     Last A1C in the office was:  Lab Results  Component Value Date   HGBA1C 10.3 (H) 02/27/2022   Lab Results  Component Value Date   GFRAA 116 03/20/2021   Patient is on Vitamin D supplement for deficiency and taking 4,000IU daily.   Lab Results  Component Value Date   VD25OH 40 02/27/2022       Current Medications:   Current Outpatient Medications (Endocrine & Metabolic):    Dulaglutide (TRULICITY) 1.5 MKK/9.3GHSOPN, Inject 1.5 mg into the skin once a week.   insulin detemir (LEVEMIR) 100 UNIT/ML injection, Increase to 12 units subcutaneously daily   metFORMIN (GLUCOPHAGE-XR) 500 MG 24 hr tablet, TAKE 2 TABLETS TWICE A DAY FOR DIABETES  Current Outpatient Medications (Cardiovascular):    benazepril (LOTENSIN) 20 MG tablet, Take  1  tablet  Daily  for BP & Diabetic Kidney Protection   bisoprolol-hydrochlorothiazide  (ZIAC) 5-6.25 MG tablet, Take 1 tablet by mouth daily. for blood pressure   fenofibrate micronized (LOFIBRA) 200 MG capsule, TAKE 1 CAPSULE BY MOUTH EVERY DAY   rosuvastatin (CRESTOR) 10 MG tablet, Take 1 tablet (10 mg total) by mouth daily.   Current Outpatient Medications (Analgesics):    allopurinol (ZYLOPRIM) 300 MG tablet, Take  1 tablet  Daily  to Prevent Gout                                                             /                                     TAKE                   BY                    MOUTH   aspirin  81 MG tablet, Take 81 mg by mouth daily.  Current Outpatient Medications (Hematological):    vitamin B-12 (CYANOCOBALAMIN) 100 MCG tablet, Take 100 mcg by mouth daily.  Current Outpatient Medications (Other):    CHOLECALCIFEROL PO, Take 15,000 Units by mouth daily.   Magnesium 500 MG TABS, Take by mouth daily.   Multiple Vitamin (MULTIVITAMIN WITH MINERALS) TABS tablet, Take 1 tablet by mouth daily.   phentermine (ADIPEX-P) 37.5 MG tablet, TAKE 1 TABLET EVERY MORNING FOR DIETING & WEIGHT LOSS /TAKE 1 TABLET BY MOUTH EVERY DAY IN THE MORNING   Blood Glucose Monitoring Suppl (FREESTYLE LITE) DEVI, 1 device to check sugars, patient on insulin   Blood Glucose Monitoring Suppl (ONETOUCH VERIO REFLECT) w/Device KIT, USE TO TEST BLODD SUGAR LEVELS DAILY   Continuous Blood Gluc Receiver (FREESTYLE LIBRE 14 DAY READER) DEVI, 1 Device by Does not apply route every 14 (fourteen) days.   Continuous Blood Gluc Sensor (FREESTYLE LIBRE 14 DAY SENSOR) MISC, 1 Units by Does not apply route every 14 (fourteen) days.   Continuous Blood Gluc Sensor (FREESTYLE LIBRE 14 DAY SENSOR) MISC, Apply 1 application topically every 14 (fourteen) days.   glucose blood (FREESTYLE LITE) test strip, Test sugar 3 x daily due to hyperglycemia and insulin use. E11.22   Insulin Pen Needle (ULTRA FLO INSULIN PEN NEEDLES) 31G X 5 MM MISC, Use to inject insulin as directed.   Lancets MISC, Test blood sugar once  daily  Allergies:  Allergies  Allergen Reactions   Ppd [Tuberculin Purified Protein Derivative]     + PPD 2010   Welchol [Colesevelam Hcl]     Chest pain     Medical History:  Past Medical History:  Diagnosis Date   Carpal tunnel syndrome on both sides    Diabetes mellitus type 2, insulin dependent ([place])    Diabetic ulcer of left foot associated with diabetes mellitus due to underlying condition, limited to breakdown of skin ([place]) 03/16/2019   Fatty liver disease, nonalcoholic    Gout    Hyperlipidemia    Hypertension    Kidney stone    Testosterone deficiency 01/25/2014   Vitamin D deficiency     Family history- Reviewed and unchanged Social history- Reviewed and unchanged   Review of Systems:  Review of Systems  Constitutional:  Negative for chills, fever and weight loss.  HENT:  Negative for congestion and hearing loss.   Eyes:  Negative for blurred vision and double vision.  Respiratory:  Negative for cough and shortness of breath.   Cardiovascular:  Negative for chest pain, palpitations, orthopnea and leg swelling.  Gastrointestinal:  Negative for abdominal pain, constipation, diarrhea, heartburn, nausea and vomiting.  Musculoskeletal:  Negative for falls, joint pain and myalgias.  Skin:  Negative for rash.       Blister on side of left great toe with callus underneath  Neurological:  Negative for dizziness, tingling, tremors, loss of consciousness and headaches.  Psychiatric/Behavioral:  Negative for depression, memory loss and suicidal ideas.       Physical Exam: BP 122/62   Pulse 77   Temp 97.9 F (36.6 C)   Ht 6' 1"  (1.854 m)   Wt 256 lb 6.4 oz (116.3 kg)   SpO2 97%   BMI 33.83 kg/m  Wt Readings from Last 3 Encounters:  05/29/22 256 lb 6.4 oz (116.3 kg)  02/27/22 261 lb 3.2 oz (118.5 kg)  11/21/21 257 lb 12.8 oz (116.9 kg)   General Appearance: Well nourished, in no apparent  distress. Eyes: PERRLA, EOMs, conjunctiva no swelling or  erythema ENT/Mouth: Ext aud canals clear, TMs without erythema, bulging. Hearing normal.  Neck: Supple, thyroid normal.  Respiratory: Respiratory effort normal, BS equal bilaterally without rales, rhonchi, wheezing or stridor.  Cardio: RRR with no MRGs. Brisk peripheral pulses without edema.  Abdomen: Soft, + BS.  Non tender, no guarding, rebound, hernias, masses. Lymphatics: Non tender without lymphadenopathy.  Musculoskeletal: Full ROM, 5/5 strength, Normal gait Skin: Warm, dry. Left great toe- blister 4X3.5 cm with a 4X3 cm callus on the underside Neuro: Cranial nerves intact. No cerebellar symptoms.  Psych: Awake and oriented X 3, normal affect, Insight and Judgment appropriate.     Magda Bernheim ANP-C  [facility]  05/29/2022

## 2022-05-29 ENCOUNTER — Encounter: Payer: Self-pay | Admitting: Nurse Practitioner

## 2022-05-29 ENCOUNTER — Ambulatory Visit (INDEPENDENT_AMBULATORY_CARE_PROVIDER_SITE_OTHER): Payer: Commercial Managed Care - HMO | Admitting: Nurse Practitioner

## 2022-05-29 VITALS — BP 122/62 | HR 77 | Temp 97.9°F | Ht 73.0 in | Wt 256.4 lb

## 2022-05-29 DIAGNOSIS — E1122 Type 2 diabetes mellitus with diabetic chronic kidney disease: Secondary | ICD-10-CM | POA: Diagnosis not present

## 2022-05-29 DIAGNOSIS — E11628 Type 2 diabetes mellitus with other skin complications: Secondary | ICD-10-CM

## 2022-05-29 DIAGNOSIS — E1165 Type 2 diabetes mellitus with hyperglycemia: Secondary | ICD-10-CM

## 2022-05-29 DIAGNOSIS — E785 Hyperlipidemia, unspecified: Secondary | ICD-10-CM

## 2022-05-29 DIAGNOSIS — I700 Atherosclerosis of aorta: Secondary | ICD-10-CM

## 2022-05-29 DIAGNOSIS — E1142 Type 2 diabetes mellitus with diabetic polyneuropathy: Secondary | ICD-10-CM | POA: Diagnosis not present

## 2022-05-29 DIAGNOSIS — Z794 Long term (current) use of insulin: Secondary | ICD-10-CM

## 2022-05-29 DIAGNOSIS — Z1211 Encounter for screening for malignant neoplasm of colon: Secondary | ICD-10-CM

## 2022-05-29 DIAGNOSIS — E559 Vitamin D deficiency, unspecified: Secondary | ICD-10-CM

## 2022-05-29 DIAGNOSIS — E669 Obesity, unspecified: Secondary | ICD-10-CM

## 2022-05-29 DIAGNOSIS — N182 Chronic kidney disease, stage 2 (mild): Secondary | ICD-10-CM

## 2022-05-29 DIAGNOSIS — I10 Essential (primary) hypertension: Secondary | ICD-10-CM

## 2022-05-29 DIAGNOSIS — I129 Hypertensive chronic kidney disease with stage 1 through stage 4 chronic kidney disease, or unspecified chronic kidney disease: Secondary | ICD-10-CM

## 2022-05-29 DIAGNOSIS — E1169 Type 2 diabetes mellitus with other specified complication: Secondary | ICD-10-CM | POA: Diagnosis not present

## 2022-05-29 DIAGNOSIS — E66811 Obesity, class 1: Secondary | ICD-10-CM

## 2022-05-29 DIAGNOSIS — L84 Corns and callosities: Secondary | ICD-10-CM

## 2022-05-29 DIAGNOSIS — Z79899 Other long term (current) drug therapy: Secondary | ICD-10-CM

## 2022-05-30 ENCOUNTER — Other Ambulatory Visit: Payer: Self-pay | Admitting: Nurse Practitioner

## 2022-05-30 DIAGNOSIS — R710 Precipitous drop in hematocrit: Secondary | ICD-10-CM

## 2022-05-30 DIAGNOSIS — E1122 Type 2 diabetes mellitus with diabetic chronic kidney disease: Secondary | ICD-10-CM

## 2022-05-30 LAB — CBC WITH DIFFERENTIAL/PLATELET
Absolute Monocytes: 490 cells/uL (ref 200–950)
Basophils Absolute: 83 cells/uL (ref 0–200)
Basophils Relative: 1.4 %
Eosinophils Absolute: 289 cells/uL (ref 15–500)
Eosinophils Relative: 4.9 %
HCT: 37 % — ABNORMAL LOW (ref 38.5–50.0)
Hemoglobin: 12.2 g/dL — ABNORMAL LOW (ref 13.2–17.1)
Lymphs Abs: 2136 cells/uL (ref 850–3900)
MCH: 30.5 pg (ref 27.0–33.0)
MCHC: 33 g/dL (ref 32.0–36.0)
MCV: 92.5 fL (ref 80.0–100.0)
MPV: 11.3 fL (ref 7.5–12.5)
Monocytes Relative: 8.3 %
Neutro Abs: 2903 cells/uL (ref 1500–7800)
Neutrophils Relative %: 49.2 %
Platelets: 249 10*3/uL (ref 140–400)
RBC: 4 10*6/uL — ABNORMAL LOW (ref 4.20–5.80)
RDW: 12.2 % (ref 11.0–15.0)
Total Lymphocyte: 36.2 %
WBC: 5.9 10*3/uL (ref 3.8–10.8)

## 2022-05-30 LAB — COMPLETE METABOLIC PANEL WITH GFR
AG Ratio: 1.4 (calc) (ref 1.0–2.5)
ALT: 23 U/L (ref 9–46)
AST: 17 U/L (ref 10–35)
Albumin: 4.4 g/dL (ref 3.6–5.1)
Alkaline phosphatase (APISO): 99 U/L (ref 35–144)
BUN/Creatinine Ratio: 19 (calc) (ref 6–22)
BUN: 25 mg/dL (ref 7–25)
CO2: 26 mmol/L (ref 20–32)
Calcium: 9.9 mg/dL (ref 8.6–10.3)
Chloride: 101 mmol/L (ref 98–110)
Creat: 1.33 mg/dL — ABNORMAL HIGH (ref 0.70–1.30)
Globulin: 3.2 g/dL (calc) (ref 1.9–3.7)
Glucose, Bld: 314 mg/dL — ABNORMAL HIGH (ref 65–99)
Potassium: 5.1 mmol/L (ref 3.5–5.3)
Sodium: 135 mmol/L (ref 135–146)
Total Bilirubin: 0.3 mg/dL (ref 0.2–1.2)
Total Protein: 7.6 g/dL (ref 6.1–8.1)
eGFR: 63 mL/min/{1.73_m2} (ref >=60)

## 2022-05-30 LAB — HEMOGLOBIN A1C
Hgb A1c MFr Bld: 11.2 % of total Hgb — ABNORMAL HIGH (ref <5.7)
Mean Plasma Glucose: 275 mg/dL
eAG (mmol/L): 15.2 mmol/L

## 2022-05-30 LAB — LIPID PANEL
Cholesterol: 132 mg/dL
HDL: 29 mg/dL — ABNORMAL LOW
Non-HDL Cholesterol (Calc): 103 mg/dL
Total CHOL/HDL Ratio: 4.6 (calc)
Triglycerides: 408 mg/dL — ABNORMAL HIGH

## 2022-05-30 MED ORDER — TRULICITY 3 MG/0.5ML ~~LOC~~ SOAJ: 3.0000 mg | SUBCUTANEOUS | 3 refills | Status: DC

## 2022-06-01 ENCOUNTER — Encounter: Payer: Self-pay | Admitting: Gastroenterology

## 2022-06-05 ENCOUNTER — Other Ambulatory Visit: Payer: Commercial Managed Care - HMO

## 2022-06-05 DIAGNOSIS — R710 Precipitous drop in hematocrit: Secondary | ICD-10-CM

## 2022-06-05 LAB — CBC WITH DIFFERENTIAL/PLATELET
Absolute Monocytes: 538 cells/uL (ref 200–950)
Basophils Absolute: 77 cells/uL (ref 0–200)
Basophils Relative: 1.2 %
Eosinophils Absolute: 307 cells/uL (ref 15–500)
Eosinophils Relative: 4.8 %
HCT: 37.4 % — ABNORMAL LOW (ref 38.5–50.0)
Hemoglobin: 12.5 g/dL — ABNORMAL LOW (ref 13.2–17.1)
Lymphs Abs: 2458 cells/uL (ref 850–3900)
MCH: 30.4 pg (ref 27.0–33.0)
MCHC: 33.4 g/dL (ref 32.0–36.0)
MCV: 91 fL (ref 80.0–100.0)
MPV: 10.8 fL (ref 7.5–12.5)
Monocytes Relative: 8.4 %
Neutro Abs: 3021 cells/uL (ref 1500–7800)
Neutrophils Relative %: 47.2 %
Platelets: 255 10*3/uL (ref 140–400)
RBC: 4.11 10*6/uL — ABNORMAL LOW (ref 4.20–5.80)
RDW: 12.4 % (ref 11.0–15.0)
Total Lymphocyte: 38.4 %
WBC: 6.4 10*3/uL (ref 3.8–10.8)

## 2022-06-11 ENCOUNTER — Other Ambulatory Visit: Payer: Self-pay

## 2022-06-11 ENCOUNTER — Ambulatory Visit (INDEPENDENT_AMBULATORY_CARE_PROVIDER_SITE_OTHER): Payer: Commercial Managed Care - HMO

## 2022-06-11 ENCOUNTER — Ambulatory Visit: Payer: Commercial Managed Care - HMO | Admitting: Podiatry

## 2022-06-11 ENCOUNTER — Encounter: Payer: Self-pay | Admitting: Podiatry

## 2022-06-11 DIAGNOSIS — L97421 Non-pressure chronic ulcer of left heel and midfoot limited to breakdown of skin: Secondary | ICD-10-CM

## 2022-06-11 DIAGNOSIS — Z1211 Encounter for screening for malignant neoplasm of colon: Secondary | ICD-10-CM | POA: Diagnosis not present

## 2022-06-11 DIAGNOSIS — L84 Corns and callosities: Secondary | ICD-10-CM

## 2022-06-11 DIAGNOSIS — E1140 Type 2 diabetes mellitus with diabetic neuropathy, unspecified: Secondary | ICD-10-CM

## 2022-06-11 DIAGNOSIS — E1149 Type 2 diabetes mellitus with other diabetic neurological complication: Secondary | ICD-10-CM | POA: Diagnosis not present

## 2022-06-11 DIAGNOSIS — Z1212 Encounter for screening for malignant neoplasm of rectum: Secondary | ICD-10-CM | POA: Diagnosis not present

## 2022-06-11 DIAGNOSIS — R710 Precipitous drop in hematocrit: Secondary | ICD-10-CM

## 2022-06-11 LAB — POC HEMOCCULT BLD/STL (HOME/3-CARD/SCREEN)
Card #2 Fecal Occult Blod, POC: NEGATIVE
Card #3 Fecal Occult Blood, POC: NEGATIVE
Fecal Occult Blood, POC: NEGATIVE

## 2022-06-11 MED ORDER — DOXYCYCLINE HYCLATE 100 MG PO TABS: 100.0000 mg | ORAL_TABLET | Freq: Two times a day (BID) | ORAL | 1 refills | Status: DC

## 2022-06-11 NOTE — Progress Notes (Signed)
Please advise hemoccults are negative- good news

## 2022-06-11 NOTE — Progress Notes (Signed)
Subjective:   Patient ID: Arthur Hudson, male   DOB: 56 y.o.   MRN: [id number]   HPI Patient presents stating that he wore the wrong shoe and developed a breakdown of tissue around his first metatarsal left and he is a diabetic and cannot feel and is concerned has had previous ulcer 3 years ago.  Patient does work full-time weightbearing and does not smoke likes to be active   Review of Systems  All other systems reviewed and are negative.       Objective:  Physical Exam Vitals and nursing note reviewed.  Constitutional:      Appearance: He is well-developed.  Pulmonary:     Effort: Pulmonary effort is normal.  Musculoskeletal:        General: Normal range of motion.  Skin:    General: Skin is warm.  Neurological:     Mental Status: He is alert.     Neurovascular status intact muscle strength was found to be adequate range of motion within normal limits.  Patient is noted to have discomfort in the left first metatarsal head around the medial side with discoloration and slight warmth in the area with no proximal edema erythema drainage noted.  Patient does have diminishment of sharp dull vibratory bilateral and does have an elevated A1c of over 11     Assessment:  Patient with poor control diabetes who has developed an abrasion left with low-grade ulceration localized     Plan:  H&P x-ray reviewed condition discussed.  Sharp sterile debridement of area measuring about 1.5 x 1.5 cm done there is no fat exposure there is no bone or tendon exposure it appears to be superficial I cleaned it out recommended wet-to-dry dressings and keeping it protected and as precautionary measure due to some localized warmth I did place him on doxycycline twice daily and then gave him strict instructions if any changes were to occur drainage or other pathology to let us know immediately.  I did not note any drainage with debridement  X-rays indicate that there is some soft tissue density around the  first metatarsal head but no indications of bony pathology

## 2022-06-29 ENCOUNTER — Ambulatory Visit: Payer: PRIVATE HEALTH INSURANCE | Admitting: Gastroenterology

## 2022-07-03 ENCOUNTER — Ambulatory Visit (AMBULATORY_SURGERY_CENTER): Payer: Self-pay

## 2022-07-03 VITALS — Ht 73.0 in | Wt 257.0 lb

## 2022-07-03 DIAGNOSIS — Z1211 Encounter for screening for malignant neoplasm of colon: Secondary | ICD-10-CM

## 2022-07-03 MED ORDER — NA SULFATE-K SULFATE-MG SULF 17.5-3.13-1.6 GM/177ML PO SOLN: 1.0000 | Freq: Once | ORAL | 0 refills | Status: AC

## 2022-07-03 NOTE — Progress Notes (Signed)
No egg or soy allergy known to patient   No issues known to pt with past sedation  with any surgeries or procedures  Patient denies ever being told they had issues or difficulty with intubation, pt states never been intubated  No FH of Malignant Hyperthermia Pt is on diet pills- phentermine , pt to stop for 10 days  Pt is not on  home 02   Pt is not on blood thinners   Pt denies issues with constipation  No A fib or A flutter  Have any cardiac testing pending--NO   Pt instructed to use [email or web address] or GoodRx for a price reduction on prep

## 2022-07-10 ENCOUNTER — Encounter: Payer: Self-pay | Admitting: Internal Medicine

## 2022-07-24 ENCOUNTER — Encounter: Payer: Self-pay | Admitting: Internal Medicine

## 2022-07-24 ENCOUNTER — Ambulatory Visit (AMBULATORY_SURGERY_CENTER): Payer: Commercial Managed Care - HMO | Admitting: Internal Medicine

## 2022-07-24 VITALS — BP 117/60 | HR 72 | Temp 98.9°F | Resp 16 | Ht 73.0 in | Wt 257.0 lb

## 2022-07-24 DIAGNOSIS — Z1211 Encounter for screening for malignant neoplasm of colon: Secondary | ICD-10-CM | POA: Diagnosis present

## 2022-07-24 DIAGNOSIS — D128 Benign neoplasm of rectum: Secondary | ICD-10-CM

## 2022-07-24 DIAGNOSIS — K621 Rectal polyp: Secondary | ICD-10-CM

## 2022-07-24 MED ORDER — SODIUM CHLORIDE 0.9 % IV SOLN: 500.0000 mL | Freq: Once | INTRAVENOUS | Status: DC

## 2022-07-24 NOTE — Op Note (Signed)
Arthur Hudson Patient Name: Arthur Hudson Procedure Date: 07/24/2022 11:31 AM MRN: [id number] Endoscopist: Docia Chuck. Henrene Pastor , MD Age: 56 Referring MD:  Date of Birth: 07-04-1966 Gender: Male Account #: [id number] Procedure:                Colonoscopy with cold snare polypectomy x 1 Indications:              Screening for colorectal malignant neoplasm Medicines:                Monitored Anesthesia Care Procedure:                Pre-Anesthesia Assessment:                           - Prior to the procedure, a History and Physical                            was performed, and patient medications and                            allergies were reviewed. The patient's tolerance of                            previous anesthesia was also reviewed. The risks                            and benefits of the procedure and the sedation                            options and risks were discussed with the patient.                            All questions were answered, and informed consent                            was obtained. Prior Anticoagulants: The patient has                            taken no previous anticoagulant or antiplatelet                            agents. After reviewing the risks and benefits, the                            patient was deemed in satisfactory condition to                            undergo the procedure.                           After obtaining informed consent, the colonoscope                            was passed under direct vision. Throughout the  procedure, the patient's blood pressure, pulse, and                            oxygen saturations were monitored continuously. The                            Olympus CF-HQ190L [id number]) Colonoscope was                            introduced through the anus and advanced to the the                            cecum, identified by appendiceal orifice and                             ileocecal valve. The ileocecal valve, appendiceal                            orifice, and rectum were photographed. The quality                            of the bowel preparation was excellent. The                            colonoscopy was performed without difficulty. The                            patient tolerated the procedure well. The bowel                            preparation used was SUPREP via split dose                            instruction. Scope In: 11:36:04 AM Scope Out: 11:50:44 AM Scope Withdrawal Time: 0 hours 11 minutes 48 seconds  Total Procedure Duration: 0 hours 14 minutes 40 seconds  Findings:                 A 5 mm polyp was found in the rectum. The polyp was                            removed with a cold snare. Resection and retrieval                            were complete.                           Internal hemorrhoids were found during retroflexion.                           The exam was otherwise without abnormality on                            direct and retroflexion views. Complications:  No immediate complications. Estimated blood loss:                            None. Estimated Blood Loss:     Estimated blood loss: none. Impression:               - One 5 mm polyp in the rectum, removed with a cold                            snare. Resected and retrieved.                           - Internal hemorrhoids.                           - The examination was otherwise normal on direct                            and retroflexion views. Recommendation:           - Repeat colonoscopy in 7-10 years for surveillance.                           - Patient has a contact number available for                            emergencies. The signs and symptoms of potential                            delayed complications were discussed with the                            patient. Return to normal activities tomorrow.                            Written discharge  instructions were provided to the                            patient.                           - Resume previous diet.                           - Continue present medications.                           - Await pathology results. Docia Chuck. Henrene Pastor, MD 07/24/2022 11:59:04 AM This report has been signed electronically.

## 2022-07-24 NOTE — Progress Notes (Signed)
HISTORY OF PRESENT ILLNESS:  Arthur Hudson is a 56 y.o. male who presents today for routine screening colonoscopy.  No complaints  REVIEW OF SYSTEMS:  All non-GI ROS negative. Past Medical History:  Diagnosis Date   Carpal tunnel syndrome on both sides    Diabetes mellitus type 2, insulin dependent ([place])    Diabetic ulcer of left foot associated with diabetes mellitus due to underlying condition, limited to breakdown of skin ([place]) 03/16/2019   Fatty liver disease, nonalcoholic    GERD (gastroesophageal reflux disease)    Gout    Hyperlipidemia    Hypertension    Kidney stone    Testosterone deficiency 01/25/2014   Vitamin D deficiency     Past Surgical History:  Procedure Laterality Date   CYSTOSCOPY WITH RETROGRADE PYELOGRAM, URETEROSCOPY AND STENT PLACEMENT Right 02/01/2015   Procedure: CYSTOSCOPY WITH RETROGRADE PYELOGRAM, URETEROSCOPY, DIGITAL URETEROSCOPY with [place];  Surgeon: Alexis Frock, MD;  Location[facility];  Service: Urology;  Laterality: Right;   HOLMIUM LASER APPLICATION Right 8/41/6606   Procedure: HOLMIUM LASER APPLICATION;  Surgeon: Alexis Frock, MD;  Location: [facility];  Service: Urology;  Laterality: Right;    Social History Arthur Hudson  reports that he quit smoking about 4 years ago. His smoking use included cigars. He has quit using smokeless tobacco.  His smokeless tobacco use included snuff. He reports that he does not currently use alcohol after a past usage of about 4.0 standard drinks of alcohol per week. He reports that he does not use drugs.  family history includes Alzheimer's disease in his father; Asthma in his mother; Diabetes in his brother, father, and mother; Esophageal cancer in his paternal uncle; Hyperlipidemia in his brother; Hypertension in his brother, father, and mother; Stroke (age of onset: 54) in his father.  Allergies  Allergen Reactions   Ppd [Tuberculin  Purified Protein Derivative]     + PPD 2010   Welchol [Colesevelam Hcl]     Chest pain       PHYSICAL EXAMINATION: Vital signs: BP 138/66   Pulse 66   Temp 98.9 F (37.2 C)   Ht '6\' 1"'$  (1.854 m)   Wt 257 lb (116.6 kg)   SpO2 99%   BMI 33.91 kg/m  General: Well-developed, well-nourished, no acute distress HEENT: Sclerae are anicteric, conjunctiva pink. Oral mucosa intact Lungs: Clear Heart: Regular Abdomen: soft, nontender, nondistended, no obvious ascites, no peritoneal signs, normal bowel sounds. No organomegaly. Extremities: No edema Psychiatric: alert and oriented x3. Cooperative      ASSESSMENT:  Colon cancer screening   PLAN:  Screening colonoscopy

## 2022-07-24 NOTE — Progress Notes (Signed)
Sedate, gd SR, tolerated procedure well, VSS, report to RN 

## 2022-07-24 NOTE — Patient Instructions (Signed)
Handout on Polyps provided   Await pathology results.   Continue current medications.    YOU HAD AN ENDOSCOPIC PROCEDURE TODAY AT [facility]:   Refer to the procedure report that was given to you for any specific questions about what was found during the examination.  If the procedure report does not answer your questions, please call your gastroenterologist to clarify.  If you requested that your care partner not be given the details of your procedure findings, then the procedure report has been included in a sealed envelope for you to review at your convenience later.  YOU SHOULD EXPECT: Some feelings of bloating in the abdomen. Passage of more gas than usual.  Walking can help get rid of the air that was put into your GI tract during the procedure and reduce the bloating. If you had a lower endoscopy (such as a colonoscopy or flexible sigmoidoscopy) you may notice spotting of blood in your stool or on the toilet paper. If you underwent a bowel prep for your procedure, you may not have a normal bowel movement for a few days.  Please Note:  You might notice some irritation and congestion in your nose or some drainage.  This is from the oxygen used during your procedure.  There is no need for concern and it should clear up in a day or so.  SYMPTOMS TO REPORT IMMEDIATELY:  Following lower endoscopy (colonoscopy or flexible sigmoidoscopy):  Excessive amounts of blood in the stool  Significant tenderness or worsening of abdominal pains  Swelling of the abdomen that is new, acute  Fever of 100F or higher  For urgent or emergent issues, a gastroenterologist can be reached at any hour by calling [phone]. Do not use MyChart messaging for urgent concerns.    DIET:  We do recommend a small meal at first, but then you may proceed to your regular diet.  Drink plenty of fluids but you should avoid alcoholic beverages for 24 hours.  ACTIVITY:  You should plan to take it easy  for the rest of today and you should NOT DRIVE or use heavy machinery until tomorrow (because of the sedation medicines used during the test).    FOLLOW UP: Our staff will call the number listed on your records the next business day following your procedure.  We will call around 7:15- 8:00 am to check on you and address any questions or concerns that you may have regarding the information given to you following your procedure. If we do not reach you, we will leave a message.     If any biopsies were taken you will be contacted by phone or by letter within the next 1-3 weeks.  Please call us at [phone] if you have not heard about the biopsies in 3 weeks.    SIGNATURES/CONFIDENTIALITY: You and/or your care partner have signed paperwork which will be entered into your electronic medical record.  These signatures attest to the fact that that the information above on your After Visit Summary has been reviewed and is understood.  Full responsibility of the confidentiality of this discharge information lies with you and/or your care-partner.

## 2022-07-24 NOTE — Progress Notes (Signed)
Pt's states no medical or surgical changes since previsit or office visit. 

## 2022-07-25 ENCOUNTER — Telehealth: Payer: Self-pay | Admitting: *Deleted

## 2022-07-25 NOTE — Telephone Encounter (Signed)
  Follow up Call-     07/24/2022   11:01 AM  Call back number  Post procedure Call Back phone  # [phone]  Permission to leave phone message Yes     Patient questions:  Do you have a fever, pain , or abdominal swelling? No. Pain Score  0 *  Have you tolerated food without any problems? Yes.    Have you been able to return to your normal activities? Yes.    Do you have any questions about your discharge instructions: Diet   No. Medications  No. Follow up visit  No.  Do you have questions or concerns about your Care? No.  Actions: * If pain score is 4 or above: No action needed, pain <4.

## 2022-08-06 ENCOUNTER — Encounter: Payer: Self-pay | Admitting: Internal Medicine

## 2022-08-07 ENCOUNTER — Other Ambulatory Visit: Payer: Self-pay | Admitting: Nurse Practitioner

## 2022-08-07 DIAGNOSIS — E669 Obesity, unspecified: Secondary | ICD-10-CM

## 2022-08-13 ENCOUNTER — Other Ambulatory Visit: Payer: Self-pay | Admitting: Nurse Practitioner

## 2022-09-03 NOTE — Progress Notes (Signed)
FOLLOW UP 3 MONTH   Assessment and Plan:   Essential hypertension Continue current medications Monitor blood pressure at home; call if consistently over 130/80 Continue DASH diet.   Reminder to go to the ER if any CP, SOB, nausea, dizziness, severe HA, changes vision/speech, left arm numbness and tingling and jaw pain. -     CBC with Diff -     COMPLETE METABOLIC PANEL WITH GFR   Hyperlipidemia associated with type 2 diabetes mellitus (HCC) Continue medications Discussed dietary and exercise modifications Low fat diet -     Lipid Profile  Type 2 DM with CKD stage 2 and hypertension ([place]) Discussed general issues about diabetes pathophysiology and management. Education: Reviewed 'ABCs' of diabetes management (respective goals in parentheses):  A1C (<7), blood pressure (<130/80), and cholesterol (LDL <70) Dietary recommendations Trulicity 3 mg SQ QW Levemir 24 units daily Not checking blood sugar- strongly encouraged to check daily Encouraged aerobic exercise.  Discussed foot care, check daily Yearly retinal exam Dental exam every 6 months Monitor blood glucose, discussed goal for patient -     Hemoglobin A1c ([facility])  CMP  CKD stage 2 due to type 2 diabetes mellitus (HCC) Continue glucose monitoring Continue medications Increase fluids Avoid NSAIDS Discussed dietary modifications and exercise Will continue to monitor  Abdominal Aortic Atherosclerosis(HCC) Monitor and control blood pressure, blood sugars, cholesterol and weight  Poorly controlled type 2 diabetes mellitus with peripheral neuropathy (HCC) Strongly encourage to check blood sugars daily Referred to podiatry Checks feet daily  Type 2 Diabetes with Pressure callus(HCC) Was opened by podiatry, was resolved but wore the wrong shoes and started to return, currently resolved Wear well fitting shoes and keep feet clean and dry  Vitamin D deficiency Continue supplementation Taking Vitamin D 4,000 IU  daily   Obesity (BMI 30.0-34.9) Discussed dietary and exercise modifications  Medication management Continued  Sore throat - Rapid strep- neg Do salt water gargles and use Mucinex as needed Most likely due to allergies- start Zyrtec or Allegra  Flu vaccine need - Flu Quad 6 MOS+ PF IM given    Continue diet and meds as discussed. Further disposition pending results of labs. Discussed med's effects and SE's.  Patient agrees with plan of care and opportunity to ask questions/voice concerns. Over 30 minutes of chart review, interview, exam, counseling, and critical decision making was performed.   Future Appointments  Date Time Provider [place]  02/28/2023  2:00 PM Alycia Rossetti, NP [facility] None    ----------------------------------------------------------------------------------------------------------------------  HPI 56 y.o. male  presents for 3 month follow up on HTN, HLD, DMII, gout, weight and vitamin D deficiency.   He has noticed a scratchy throat and some sinus congestion x past several days.  Denies fever, body aches, cough, nausea and vomiting.   BMI is Body mass index is 33.72 kg/m., he has not been working on diet and exercise. Continues on Trulicity 3 mg SQ QW and is trying to eat less carbs, has been mowing grass for exercise Wt Readings from Last 3 Encounters:  09/04/22 255 lb 9.6 oz (115.9 kg)  07/24/22 257 lb (116.6 kg)  07/03/22 257 lb (116.6 kg)    His blood pressure has not been controlled at home, today their BP is BP: 106/68  BP Readings from Last 3 Encounters:  09/04/22 106/68  07/24/22 117/60  05/29/22 122/62  He does not workout. He denies any cardiac symptoms, chest pains, palpitations, shortness of breath, dizziness or lower extremity edema.  He is on cholesterol medication , Rosuvastatin 10 mg QD and fenofibrate 200 mg QD, and denies myalgias.   His cholesterol is not at goal of less than 70. The cholesterol last visit  was:   Lab Results  Component Value Date   CHOL 132 05/29/2022   HDL 29 (L) 05/29/2022   [place]  05/29/2022     Comment:     . LDL cholesterol not calculated. Triglyceride levels greater than 400 mg/dL invalidate calculated LDL results. . Reference range: <100 . Desirable range <100 mg/dL for primary prevention;   <70 mg/dL for patients with CHD or diabetic patients  with > or = 2 CHD risk factors. Marland Kitchen LDL-C is now calculated using the Martin-Hopkins  calculation, which is a validated novel method providing  better accuracy than the Friedewald equation in the  estimation of LDL-C.  [name] et al. Annamaria Helling. 1610;960(45): 2061-2068  (http://education.[employer].com/faq/FAQ164)    TRIG 408 (H) 05/29/2022   CHOLHDL 4.6 05/29/2022    He has not been working on diet and exercise for DMII - he has been eating more carbs.  Normal kidney function Hyperlipidemia With neuropathy Has not been checking his blood sugars He is on Trulicity 3 mg  He is taking Levemir 24 units daily every AM  He is also taking Meformin XR 544m two tablets twice a day.  Has not been checking his blood sugars Eye Exam 2022 and denies polydipsia, polyuria, visual disturbances, vomiting and weight loss.     Last A1C in the office was:  Lab Results  Component Value Date   HGBA1C 11.2 (H) 05/29/2022   Lab Results  Component Value Date   EGFR 63 05/29/2022    Patient is on Vitamin D supplement for deficiency and taking 4,000IU daily.   Lab Results  Component Value Date   VD25OH 40 02/27/2022       Current Medications:   Current Outpatient Medications (Endocrine & Metabolic):    Dulaglutide (TRULICITY) 3 MWU/9.8JXSOPN, Inject 3 mg as directed once a week.   insulin detemir (LEVEMIR) 100 UNIT/ML injection, Increase to 12 units subcutaneously daily (Patient taking differently: Increase to 24 units subcutaneously daily)   metFORMIN (GLUCOPHAGE-XR) 500 MG 24 hr tablet, TAKE 2 TABLETS TWICE A DAY  FOR DIABETES   Current Outpatient Medications (Cardiovascular):    atorvastatin (LIPITOR) 10 MG tablet, Take 10 mg by mouth daily.   benazepril (LOTENSIN) 20 MG tablet, Take  1  tablet  Daily  for BP & Diabetic Kidney Protection   bisoprolol-hydrochlorothiazide (ZIAC) 5-6.25 MG tablet, Take 1 tablet by mouth daily. for blood pressure   fenofibrate micronized (LOFIBRA) 200 MG capsule, TAKE 1 CAPSULE BY MOUTH EVERY DAY   rosuvastatin (CRESTOR) 10 MG tablet, Take 1 tablet (10 mg total) by mouth daily.     Current Outpatient Medications (Analgesics):    allopurinol (ZYLOPRIM) 300 MG tablet, Take  1 tablet  Daily  to Prevent Gout                                                             /  TAKE                   BY                    MOUTH   aspirin 81 MG tablet, Take 81 mg by mouth daily.   ibuprofen (ADVIL) 800 MG tablet, Take 800 mg by mouth 3 (three) times daily.   Current Outpatient Medications (Hematological):    vitamin B-12 (CYANOCOBALAMIN) 100 MCG tablet, Take 100 mcg by mouth daily.   Current Outpatient Medications (Other):    CHOLECALCIFEROL PO, Take 15,000 Units by mouth daily.   doxycycline (VIBRA-TABS) 100 MG tablet, Take 1 tablet (100 mg total) by mouth 2 (two) times daily.   Magnesium 500 MG TABS, Take by mouth daily.   Multiple Vitamin (MULTIVITAMIN WITH MINERALS) TABS tablet, Take 1 tablet by mouth daily.   phentermine (ADIPEX-P) 37.5 MG tablet, TAKE 1 TABLET EVERY MORNING FOR DIETING & WEIGHT LOSS   Blood Glucose Monitoring Suppl (FREESTYLE LITE) DEVI, 1 device to check sugars, patient on insulin   Blood Glucose Monitoring Suppl (ONETOUCH VERIO REFLECT) w/Device KIT, USE TO TEST BLODD SUGAR LEVELS DAILY   Continuous Blood Gluc Receiver (FREESTYLE LIBRE 14 DAY READER) DEVI, 1 Device by Does not apply route every 14 (fourteen) days.   Continuous Blood Gluc Sensor (FREESTYLE LIBRE 14 DAY SENSOR) MISC, 1 Units by Does not apply route  every 14 (fourteen) days.   glucose blood (FREESTYLE LITE) test strip, Test sugar 3 x daily due to hyperglycemia and insulin use. E11.22   Insulin Pen Needle (ULTRA FLO INSULIN PEN NEEDLES) 31G X 5 MM MISC, Use to inject insulin as directed.   Lancets MISC, Test blood sugar once daily  Current Facility-Administered Medications (Other):    0.9 %  sodium chloride infusion  Allergies:  Allergies  Allergen Reactions   Ppd [Tuberculin Purified Protein Derivative]     + PPD 2010   Welchol [Colesevelam Hcl]     Chest pain     Medical History:  Past Medical History:  Diagnosis Date   Carpal tunnel syndrome on both sides    Diabetes mellitus type 2, insulin dependent ([place])    Diabetic ulcer of left foot associated with diabetes mellitus due to underlying condition, limited to breakdown of skin ([place]) 03/16/2019   Fatty liver disease, nonalcoholic    GERD (gastroesophageal reflux disease)    Gout    Hyperlipidemia    Hypertension    Kidney stone    Testosterone deficiency 01/25/2014   Vitamin D deficiency     Family history- Reviewed and unchanged Social history- Reviewed and unchanged   Review of Systems:  Review of Systems  Constitutional:  Negative for chills, fever and weight loss.  HENT:  Negative for congestion and hearing loss.   Eyes:  Negative for blurred vision and double vision.  Respiratory:  Negative for cough and shortness of breath.   Cardiovascular:  Negative for chest pain, palpitations, orthopnea and leg swelling.  Gastrointestinal:  Negative for abdominal pain, constipation, diarrhea, heartburn, nausea and vomiting.  Musculoskeletal:  Negative for falls, joint pain and myalgias.  Skin:  Negative for rash.  Neurological:  Negative for dizziness, tingling, tremors, loss of consciousness and headaches.  Psychiatric/Behavioral:  Negative for depression, memory loss and suicidal ideas.       Physical Exam: BP 106/68   Pulse 72   Temp (!) 97.5 F (36.4 C)    Ht 6' 1"  (1.854 m)  Wt 255 lb 9.6 oz (115.9 kg)   SpO2 98%   BMI 33.72 kg/m  Wt Readings from Last 3 Encounters:  09/04/22 255 lb 9.6 oz (115.9 kg)  07/24/22 257 lb (116.6 kg)  07/03/22 257 lb (116.6 kg)   General Appearance: Well nourished, in no apparent distress. Eyes: PERRLA, EOMs, conjunctiva no swelling or erythema ENT/Mouth: Ext aud canals clear, TMs without erythema, bulging. Hearing normal Tonsils swollen without erythema or drainage  Neck: Supple, thyroid normal.  Respiratory: Respiratory effort normal, BS equal bilaterally without rales, rhonchi, wheezing or stridor.  Cardio: RRR with no MRGs. Brisk peripheral pulses without edema.  Abdomen: Soft, + BS.  Non tender, no guarding, rebound, hernias, masses. Lymphatics: Non tender without lymphadenopathy.  Musculoskeletal: Full ROM, 5/5 strength, Normal gait Skin: Warm, dry. No lesions or rashes Neuro: Cranial nerves intact. No cerebellar symptoms.  Psych: Awake and oriented X 3, normal affect, Insight and Judgment appropriate.     Magda Bernheim ANP-C  [facility]  09/04/2022

## 2022-09-04 ENCOUNTER — Encounter: Payer: Self-pay | Admitting: Nurse Practitioner

## 2022-09-04 ENCOUNTER — Ambulatory Visit (INDEPENDENT_AMBULATORY_CARE_PROVIDER_SITE_OTHER): Payer: Commercial Managed Care - HMO | Admitting: Nurse Practitioner

## 2022-09-04 VITALS — BP 106/68 | HR 72 | Temp 97.5°F | Ht 73.0 in | Wt 255.6 lb

## 2022-09-04 DIAGNOSIS — E1169 Type 2 diabetes mellitus with other specified complication: Secondary | ICD-10-CM | POA: Diagnosis not present

## 2022-09-04 DIAGNOSIS — Z23 Encounter for immunization: Secondary | ICD-10-CM

## 2022-09-04 DIAGNOSIS — E11628 Type 2 diabetes mellitus with other skin complications: Secondary | ICD-10-CM

## 2022-09-04 DIAGNOSIS — L84 Corns and callosities: Secondary | ICD-10-CM

## 2022-09-04 DIAGNOSIS — E785 Hyperlipidemia, unspecified: Secondary | ICD-10-CM

## 2022-09-04 DIAGNOSIS — E1122 Type 2 diabetes mellitus with diabetic chronic kidney disease: Secondary | ICD-10-CM | POA: Diagnosis not present

## 2022-09-04 DIAGNOSIS — J029 Acute pharyngitis, unspecified: Secondary | ICD-10-CM | POA: Diagnosis not present

## 2022-09-04 DIAGNOSIS — I700 Atherosclerosis of aorta: Secondary | ICD-10-CM | POA: Diagnosis not present

## 2022-09-04 DIAGNOSIS — E559 Vitamin D deficiency, unspecified: Secondary | ICD-10-CM

## 2022-09-04 DIAGNOSIS — N182 Chronic kidney disease, stage 2 (mild): Secondary | ICD-10-CM

## 2022-09-04 DIAGNOSIS — I129 Hypertensive chronic kidney disease with stage 1 through stage 4 chronic kidney disease, or unspecified chronic kidney disease: Secondary | ICD-10-CM

## 2022-09-04 DIAGNOSIS — Z79899 Other long term (current) drug therapy: Secondary | ICD-10-CM

## 2022-09-04 DIAGNOSIS — E669 Obesity, unspecified: Secondary | ICD-10-CM

## 2022-09-04 LAB — POCT RAPID STREP A (OFFICE): Rapid Strep A Screen: NEGATIVE

## 2022-09-04 NOTE — Patient Instructions (Signed)
Sore Throat A sore throat is pain, burning, irritation, or scratchiness in the throat. When you have a sore throat, you may feel pain or tenderness in your throat when you swallow or talk. Many things can cause a sore throat, including: An infection. Seasonal allergies. Dryness in the air. Irritants, such as smoke or pollution. Radiation treatment for cancer. Gastroesophageal reflux disease (GERD). A tumor. A sore throat is often the first sign of another sickness. It may happen with other symptoms, such as coughing, sneezing, fever, and swollen neck glands. Most sore throats go away without medical treatment. Follow these instructions at home:     Medicines Take over-the-counter and prescription medicines only as told by your health care provider. Children often get sore throats. Do not give your child aspirin because of the association with Reye's syndrome. Use throat sprays to soothe your throat as told by your health care provider. Managing pain To help with pain, try: Sipping warm liquids, such as broth, herbal tea, or warm water. Eating or drinking cold or frozen liquids, such as frozen ice pops. Gargling with a mixture of salt and water 3-4 times a day or as needed. To make salt water, completely dissolve -1 tsp (3-6 g) of salt in 1 cup (237 mL) of warm water. Sucking on hard candy or throat lozenges. Putting a cool-mist humidifier in your bedroom at night to moisten the air. Sitting in the bathroom with the door closed for 5-10 minutes while you run hot water in the shower. General instructions Do not use any products that contain nicotine or tobacco. These products include cigarettes, chewing tobacco, and vaping devices, such as e-cigarettes. If you need help quitting, ask your health care provider. Rest as needed. Drink enough fluid to keep your urine pale yellow. Wash your hands often with soap and water for at least 20 seconds. If soap and water are not available, use hand  sanitizer. Contact a health care provider if: You have a fever for more than 2-3 days. You have symptoms that last for more than 2-3 days. Your throat does not get better within 7 days. You have a fever and your symptoms suddenly get worse. Get help right away if: You have difficulty breathing. You cannot swallow fluids, soft foods, or your saliva. You have increased swelling in your throat or neck. You have persistent nausea and vomiting. These symptoms may represent a serious problem that is an emergency. Do not wait to see if the symptoms will go away. Get medical help right away. Call your local emergency services (911 in the [place] Do not drive yourself to the hospital. Summary A sore throat is pain, burning, irritation, or scratchiness in the throat. Many things can cause a sore throat. Take over-the-counter medicines only as told by your health care provider. Rest as needed. Drink enough fluid to keep your urine pale yellow. Contact a health care provider if your throat does not get better within 7 days. This information is not intended to replace advice given to you by your health care provider. Make sure you discuss any questions you have with your health care provider. Document Revised: 01/25/2021 Document Reviewed: 01/25/2021 Elsevier Patient Education  [place].

## 2022-09-05 LAB — COMPLETE METABOLIC PANEL WITH GFR
AG Ratio: 1.3 (calc) (ref 1.0–2.5)
ALT: 18 U/L (ref 9–46)
AST: 14 U/L (ref 10–35)
Albumin: 4.5 g/dL (ref 3.6–5.1)
Alkaline phosphatase (APISO): 90 U/L (ref 35–144)
BUN: 20 mg/dL (ref 7–25)
CO2: 21 mmol/L (ref 20–32)
Calcium: 9.8 mg/dL (ref 8.6–10.3)
Chloride: 105 mmol/L (ref 98–110)
Creat: 1.01 mg/dL (ref 0.70–1.30)
Globulin: 3.5 g/dL (calc) (ref 1.9–3.7)
Glucose, Bld: 152 mg/dL — ABNORMAL HIGH (ref 65–99)
Potassium: 4.9 mmol/L (ref 3.5–5.3)
Sodium: 134 mmol/L — ABNORMAL LOW (ref 135–146)
Total Bilirubin: 0.3 mg/dL (ref 0.2–1.2)
Total Protein: 8 g/dL (ref 6.1–8.1)
eGFR: 87 mL/min/{1.73_m2} (ref >=60)

## 2022-09-05 LAB — CBC WITH DIFFERENTIAL/PLATELET
Absolute Monocytes: 691 cells/uL (ref 200–950)
Basophils Absolute: 94 cells/uL (ref 0–200)
Basophils Relative: 1.3 %
Eosinophils Absolute: 374 cells/uL (ref 15–500)
Eosinophils Relative: 5.2 %
HCT: 40.6 % (ref 38.5–50.0)
Hemoglobin: 13.8 g/dL (ref 13.2–17.1)
Lymphs Abs: 2585 cells/uL (ref 850–3900)
MCH: 30.9 pg (ref 27.0–33.0)
MCHC: 34 g/dL (ref 32.0–36.0)
MCV: 90.8 fL (ref 80.0–100.0)
MPV: 11 fL (ref 7.5–12.5)
Monocytes Relative: 9.6 %
Neutro Abs: 3456 cells/uL (ref 1500–7800)
Neutrophils Relative %: 48 %
Platelets: 278 10*3/uL (ref 140–400)
RBC: 4.47 10*6/uL (ref 4.20–5.80)
RDW: 12.4 % (ref 11.0–15.0)
Total Lymphocyte: 35.9 %
WBC: 7.2 10*3/uL (ref 3.8–10.8)

## 2022-09-05 LAB — LIPID PANEL
Cholesterol: 121 mg/dL (ref <200)
HDL: 36 mg/dL — ABNORMAL LOW (ref >=40)
LDL Cholesterol (Calc): 62 mg/dL (calc)
Non-HDL Cholesterol (Calc): 85 mg/dL (calc) (ref <130)
Total CHOL/HDL Ratio: 3.4 (calc) (ref <5.0)
Triglycerides: 155 mg/dL — ABNORMAL HIGH (ref <150)

## 2022-09-05 LAB — HEMOGLOBIN A1C
Hgb A1c MFr Bld: 8.6 % of total Hgb — ABNORMAL HIGH (ref <5.7)
Mean Plasma Glucose: 200 mg/dL
eAG (mmol/L): 11.1 mmol/L

## 2022-10-16 ENCOUNTER — Other Ambulatory Visit: Payer: Self-pay | Admitting: Nurse Practitioner

## 2022-10-16 ENCOUNTER — Other Ambulatory Visit: Payer: Self-pay | Admitting: Internal Medicine

## 2022-10-16 MED ORDER — BISOPROLOL-HYDROCHLOROTHIAZIDE 5-6.25 MG PO TABS: ORAL_TABLET | ORAL | 3 refills | Status: DC

## 2022-11-08 ENCOUNTER — Other Ambulatory Visit: Payer: Self-pay | Admitting: Nurse Practitioner

## 2022-11-08 DIAGNOSIS — E1122 Type 2 diabetes mellitus with diabetic chronic kidney disease: Secondary | ICD-10-CM

## 2022-11-11 ENCOUNTER — Other Ambulatory Visit: Payer: Self-pay | Admitting: Nurse Practitioner

## 2022-11-11 DIAGNOSIS — E1169 Type 2 diabetes mellitus with other specified complication: Secondary | ICD-10-CM

## 2022-11-14 ENCOUNTER — Other Ambulatory Visit: Payer: Self-pay

## 2022-11-14 MED ORDER — BENAZEPRIL HCL 20 MG PO TABS: ORAL_TABLET | ORAL | 3 refills | Status: DC

## 2022-12-11 ENCOUNTER — Ambulatory Visit: Payer: Self-pay | Admitting: Nurse Practitioner

## 2022-12-13 ENCOUNTER — Telehealth: Payer: Self-pay

## 2022-12-13 NOTE — Telephone Encounter (Signed)
Prior Josem Kaufmann has been completed and submitted.

## 2022-12-17 NOTE — Progress Notes (Unsigned)
FOLLOW UP 3 MONTH   Assessment and Plan:   Essential hypertension Continue current medications Monitor blood pressure at home; call if consistently over 130/80 Continue DASH diet.   Reminder to go to the ER if any CP, SOB, nausea, dizziness, severe HA, changes vision/speech, left arm numbness and tingling and jaw pain. -     CBC with Diff -     COMPLETE METABOLIC PANEL WITH GFR   Hyperlipidemia associated with type 2 diabetes mellitus (HCC) Continue medications Discussed dietary and exercise modifications Low fat diet -     Lipid Profile  Type 2 DM with CKD stage 2 and hypertension ([place]) Discussed general issues about diabetes pathophysiology and management. Education: Reviewed 'ABCs' of diabetes management (respective goals in parentheses):  A1C (<7), blood pressure (<130/80), and cholesterol (LDL <70) Dietary recommendations Trulicity 3 mg SQ QW- has been on backorder and been out for over a month Levemir 24 units daily Not checking blood sugar- strongly encouraged to check daily, Stressed the importance on knowing blood sugars for insulin adjusting Encouraged aerobic exercise.  Discussed foot care, check daily Yearly retinal exam Dental exam every 6 months Monitor blood glucose, discussed goal for patient -     Hemoglobin A1c ([facility])  CMP  CKD stage 2 due to type 2 diabetes mellitus (HCC) Continue glucose monitoring Continue medications Increase fluids Avoid NSAIDS Discussed dietary modifications and exercise Will continue to monitor  Abdominal Aortic Atherosclerosis(HCC) Monitor and control blood pressure, blood sugars, cholesterol and weight  Poorly controlled type 2 diabetes mellitus with peripheral neuropathy (HCC) Strongly encourage to check blood sugars daily Checks feet daily   Vitamin D deficiency Continue supplementation Taking Vitamin D 4,000 IU daily   Obesity (BMI 30.0-34.9)with Type 2 Diabetes Mellitus(HCC) Long discussion about weight loss,  diet, and exercise Recommended diet heavy in fruits and veggies and low in animal meats, cheeses, and dairy products, appropriate calorie intake Patient will work on decreasing saturated fats, simple carbs and increasing protein and activity Follow up at next visit  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 Paulla Dolly,  podiatry Instructed on callus treatment- continue to monitor  Medication management Continued    Continue diet and meds as discussed. Further disposition pending results of labs. Discussed med's effects and SE's.  Patient agrees with plan of care and opportunity to ask questions/voice concerns. Over 30 minutes of chart review, interview, exam, counseling, and critical decision making was performed.   Future Appointments  Date Time Provider [place]  03/12/2023  9:00 AM Alycia Rossetti, NP [facility] None    ----------------------------------------------------------------------------------------------------------------------  HPI 57 y.o. male  presents for 3 month follow up on HTN, HLD, DMII, gout, weight and vitamin D deficiency.   He has a callus/blister area on his left great toe which occurred with new shoes x 1 month. It initially developed a blister on the side which has resolved but the callus on bottom of foot persists.    Area is not painful.  He does have tingling in both feet does not go up his legs. Area was previously evaluated by podiatry and instructed on filing of callus.   BMI is Body mass index is 33.72 kg/m., he has not been working on diet and exercise. Continues on Trulicity 3 mg SQ QW and is trying to eat less carbs, has not been exercising as much. He has Phentermine but rarely uses.  Wt Readings from Last 3 Encounters:  12/18/22 255 lb 9.6 oz (115.9 kg)  09/04/22 255 lb 9.6 oz (115.9 kg)  07/24/22 257 lb (116.6 kg)    His blood pressure has not been controlled at home, today their BP is BP: 138/80  BP  Readings from Last 3 Encounters:  12/18/22 138/80  09/04/22 106/68  07/24/22 117/60  He does not workout. He denies any cardiac symptoms, chest pains, palpitations, shortness of breath, dizziness or lower extremity edema.      He is on cholesterol medication , Rosuvastatin 10 mg QD and fenofibrate 200 mg QD, and denies myalgias.   His cholesterol is at goal of less than 70. The cholesterol last visit was:   Lab Results  Component Value Date   CHOL 121 09/04/2022   HDL 36 (L) 09/04/2022   LDLCALC 62 09/04/2022   TRIG 155 (H) 09/04/2022   CHOLHDL 3.4 09/04/2022    He has not been working on diet and exercise for DMII - he has been eating more carbs.  Normal kidney function Hyperlipidemia With neuropathy Has not been checking his blood sugars- does have supplies He is on Trulicity 3 mg but has been out for over a month due to backorder of medication He is taking Levemir 28 units daily every AM since has been out of Trulicity for a month but has not been checking his blood sugars to see if they are in range with adjustment He is also taking Meformin XR '500mg'$  two tablets twice a day.  He is behind 1 year on Eye exam and denies polydipsia, polyuria, visual disturbances, vomiting and weight loss.     Last A1C in the office was:  Lab Results  Component Value Date   HGBA1C 8.6 (H) 09/04/2022    He is in stage 2 CKD and tries to drink increased fluid throughout the day Lab Results  Component Value Date   EGFR 87 09/04/2022     Patient is on Vitamin D supplement for deficiency and taking 4,000IU daily.   Lab Results  Component Value Date   VD25OH 40 02/27/2022       Current Medications:   Current Outpatient Medications (Endocrine & Metabolic):    insulin detemir (LEVEMIR) 100 UNIT/ML injection, Increase to 12 units subcutaneously daily (Patient taking differently: Increase to 24 units subcutaneously daily)   metFORMIN (GLUCOPHAGE-XR) 500 MG 24 hr tablet, TAKE 2 TABLETS TWICE A  DAY FOR DIABETES   TRULICITY 3 KP/5.3ZS SOPN, ADMINISTER 0.5 ML UNDER THE SKIN 1 TIME A WEEK AS DIRECTED   Current Outpatient Medications (Cardiovascular):    atorvastatin (LIPITOR) 10 MG tablet, TAKE 1 TABLET BY MOUTH EVERY DAY   benazepril (LOTENSIN) 20 MG tablet, Take  1  tablet  Daily  for BP & Diabetic Kidney Protection   bisoprolol-hydrochlorothiazide (ZIAC) 5-6.25 MG tablet, Take  1 tablet  Daily   for BP                                                                      /                                       TAKE  BY                                      MOUTH   fenofibrate micronized (LOFIBRA) 200 MG capsule, Take  1 capsule  Daily  for Triglycerides  (Blood Fats)                                                            /                                   TAKE                                              BY                                         MOUTH   rosuvastatin (CRESTOR) 10 MG tablet, Take 1 tablet (10 mg total) by mouth daily.     Current Outpatient Medications (Analgesics):    allopurinol (ZYLOPRIM) 300 MG tablet, Take  1 tablet  Daily  to Prevent Gout                                                             /                                     TAKE                   BY                    MOUTH   aspirin 81 MG tablet, Take 81 mg by mouth daily.   ibuprofen (ADVIL) 800 MG tablet, Take 800 mg by mouth 3 (three) times daily.   Current Outpatient Medications (Hematological):    vitamin B-12 (CYANOCOBALAMIN) 100 MCG tablet, Take 100 mcg by mouth daily.   Current Outpatient Medications (Other):    Blood Glucose Monitoring Suppl (FREESTYLE LITE) DEVI, 1 device to check sugars, patient on insulin   Blood Glucose Monitoring Suppl (ONETOUCH VERIO REFLECT) w/Device KIT, USE TO TEST BLODD SUGAR LEVELS DAILY   CHOLECALCIFEROL PO, Take 15,000 Units by mouth daily.   Continuous Blood Gluc Receiver (FREESTYLE LIBRE 14 DAY READER) DEVI, 1 Device by  Does not apply route every 14 (fourteen) days.   Continuous Blood Gluc Sensor (FREESTYLE LIBRE 14 DAY SENSOR) MISC, 1 Units by Does not apply route every 14 (fourteen) days.   doxycycline (VIBRA-TABS) 100 MG tablet, Take 1 tablet (100 mg total) by mouth 2 (two) times daily.   glucose blood (FREESTYLE LITE) test strip, Test sugar 3 x daily  due to hyperglycemia and insulin use. E11.22   Insulin Pen Needle (ULTRA FLO INSULIN PEN NEEDLES) 31G X 5 MM MISC, Use to inject insulin as directed.   Lancets MISC, Test blood sugar once daily   Magnesium 500 MG TABS, Take by mouth daily.   Multiple Vitamin (MULTIVITAMIN WITH MINERALS) TABS tablet, Take 1 tablet by mouth daily.   phentermine (ADIPEX-P) 37.5 MG tablet, TAKE 1 TABLET EVERY MORNING FOR DIETING & WEIGHT LOSS  Current Facility-Administered Medications (Other):    0.9 %  sodium chloride infusion  Allergies:  Allergies  Allergen Reactions   Ppd [Tuberculin Purified Protein Derivative]     + PPD 2010   Welchol [Colesevelam Hcl]     Chest pain     Medical History:  Past Medical History:  Diagnosis Date   Carpal tunnel syndrome on both sides    Diabetes mellitus type 2, insulin dependent ([place])    Diabetic ulcer of left foot associated with diabetes mellitus due to underlying condition, limited to breakdown of skin ([place]) 03/16/2019   Fatty liver disease, nonalcoholic    GERD (gastroesophageal reflux disease)    Gout    Hyperlipidemia    Hypertension    Kidney stone    Testosterone deficiency 01/25/2014   Vitamin D deficiency     Family history- Reviewed and unchanged Social history- Reviewed and unchanged   Review of Systems:  Review of Systems  Constitutional:  Negative for chills, fever and weight loss.  HENT:  Negative for congestion and hearing loss.   Eyes:  Negative for blurred vision and double vision.  Respiratory:  Negative for cough and shortness of breath.   Cardiovascular:  Negative for chest pain, palpitations,  orthopnea and leg swelling.  Gastrointestinal:  Negative for abdominal pain, constipation, diarrhea, heartburn, nausea and vomiting.  Musculoskeletal:  Negative for falls, joint pain and myalgias.  Skin:  Negative for rash.       Large callus underneath left great toe  Neurological:  Negative for dizziness, tingling, tremors, loss of consciousness and headaches.  Psychiatric/Behavioral:  Negative for depression, memory loss and suicidal ideas.       Physical Exam: BP 138/80   Pulse 72   Temp 97.9 F (36.6 C)   Resp 16   Ht '6\' 1"'$  (1.854 m)   Wt 255 lb 9.6 oz (115.9 kg)   SpO2 99%   BMI 33.72 kg/m  Wt Readings from Last 3 Encounters:  12/18/22 255 lb 9.6 oz (115.9 kg)  09/04/22 255 lb 9.6 oz (115.9 kg)  07/24/22 257 lb (116.6 kg)   General Appearance: Well nourished, in no apparent distress. Eyes: PERRLA, EOMs, conjunctiva no swelling or erythema ENT/Mouth: Ext aud canals clear, TMs without erythema, bulging. Hearing normal.  Neck: Supple, thyroid normal.  Respiratory: Respiratory effort normal, BS equal bilaterally without rales, rhonchi, wheezing or stridor.  Cardio: RRR with no MRGs. Brisk peripheral pulses without edema.  Abdomen: Soft, + BS.  Non tender, no guarding, rebound, hernias, masses. Lymphatics: Non tender without lymphadenopathy.  Musculoskeletal: Full ROM, 5/5 strength, Normal gait Skin: Warm, dry. Left great toe 4X3 cm callus on the underside Neuro: Cranial nerves intact. No cerebellar symptoms.  Psych: Awake and oriented X 3, normal affect, Insight and Judgment appropriate.     Arthur Hudson ANP-C  [facility]  12/18/2022

## 2022-12-18 ENCOUNTER — Other Ambulatory Visit (HOSPITAL_COMMUNITY): Payer: Self-pay

## 2022-12-18 ENCOUNTER — Encounter: Payer: Self-pay | Admitting: Nurse Practitioner

## 2022-12-18 ENCOUNTER — Ambulatory Visit (INDEPENDENT_AMBULATORY_CARE_PROVIDER_SITE_OTHER): Payer: PRIVATE HEALTH INSURANCE | Admitting: Nurse Practitioner

## 2022-12-18 VITALS — BP 138/80 | HR 72 | Temp 97.9°F | Resp 16 | Ht 73.0 in | Wt 255.6 lb

## 2022-12-18 DIAGNOSIS — I129 Hypertensive chronic kidney disease with stage 1 through stage 4 chronic kidney disease, or unspecified chronic kidney disease: Secondary | ICD-10-CM

## 2022-12-18 DIAGNOSIS — E785 Hyperlipidemia, unspecified: Secondary | ICD-10-CM | POA: Diagnosis not present

## 2022-12-18 DIAGNOSIS — E1142 Type 2 diabetes mellitus with diabetic polyneuropathy: Secondary | ICD-10-CM

## 2022-12-18 DIAGNOSIS — E559 Vitamin D deficiency, unspecified: Secondary | ICD-10-CM

## 2022-12-18 DIAGNOSIS — E1169 Type 2 diabetes mellitus with other specified complication: Secondary | ICD-10-CM | POA: Diagnosis not present

## 2022-12-18 DIAGNOSIS — E1122 Type 2 diabetes mellitus with diabetic chronic kidney disease: Secondary | ICD-10-CM | POA: Diagnosis not present

## 2022-12-18 DIAGNOSIS — I700 Atherosclerosis of aorta: Secondary | ICD-10-CM

## 2022-12-18 DIAGNOSIS — I10 Essential (primary) hypertension: Secondary | ICD-10-CM

## 2022-12-18 DIAGNOSIS — Z79899 Other long term (current) drug therapy: Secondary | ICD-10-CM

## 2022-12-18 DIAGNOSIS — L84 Corns and callosities: Secondary | ICD-10-CM

## 2022-12-18 DIAGNOSIS — Z794 Long term (current) use of insulin: Secondary | ICD-10-CM

## 2022-12-18 DIAGNOSIS — N182 Chronic kidney disease, stage 2 (mild): Secondary | ICD-10-CM

## 2022-12-18 DIAGNOSIS — E669 Obesity, unspecified: Secondary | ICD-10-CM

## 2022-12-18 DIAGNOSIS — E11628 Type 2 diabetes mellitus with other skin complications: Secondary | ICD-10-CM | POA: Diagnosis not present

## 2022-12-18 DIAGNOSIS — E1165 Type 2 diabetes mellitus with hyperglycemia: Secondary | ICD-10-CM

## 2022-12-18 MED ORDER — TRULICITY 3 MG/0.5ML ~~LOC~~ SOAJ
3.0000 mg | SUBCUTANEOUS | 3 refills | Status: DC
  Filled 2022-12-18 – 2022-12-27 (×2): qty 2, 28d supply, fill #0
  Filled 2023-02-01: qty 2, 28d supply, fill #1
  Filled 2023-03-13: qty 2, 28d supply, fill #2

## 2022-12-18 NOTE — Patient Instructions (Signed)

## 2022-12-19 ENCOUNTER — Other Ambulatory Visit: Payer: Self-pay | Admitting: Nurse Practitioner

## 2022-12-19 DIAGNOSIS — E1169 Type 2 diabetes mellitus with other specified complication: Secondary | ICD-10-CM

## 2022-12-19 LAB — CBC WITH DIFFERENTIAL/PLATELET
Absolute Monocytes: 574 cells/uL (ref 200–950)
Basophils Absolute: 99 cells/uL (ref 0–200)
Basophils Relative: 1.7 %
Eosinophils Absolute: 267 cells/uL (ref 15–500)
Eosinophils Relative: 4.6 %
HCT: 39.5 % (ref 38.5–50.0)
Hemoglobin: 13.5 g/dL (ref 13.2–17.1)
Lymphs Abs: 1879 cells/uL (ref 850–3900)
MCH: 30.1 pg (ref 27.0–33.0)
MCHC: 34.2 g/dL (ref 32.0–36.0)
MCV: 88.2 fL (ref 80.0–100.0)
MPV: 11.2 fL (ref 7.5–12.5)
Monocytes Relative: 9.9 %
Neutro Abs: 2981 cells/uL (ref 1500–7800)
Neutrophils Relative %: 51.4 %
Platelets: 264 10*3/uL (ref 140–400)
RBC: 4.48 10*6/uL (ref 4.20–5.80)
RDW: 12.4 % (ref 11.0–15.0)
Total Lymphocyte: 32.4 %
WBC: 5.8 10*3/uL (ref 3.8–10.8)

## 2022-12-19 LAB — COMPLETE METABOLIC PANEL WITH GFR
AG Ratio: 1 (calc) (ref 1.0–2.5)
ALT: 24 U/L (ref 9–46)
AST: 19 U/L (ref 10–35)
Albumin: 4.4 g/dL (ref 3.6–5.1)
Alkaline phosphatase (APISO): 111 U/L (ref 35–144)
BUN: 16 mg/dL (ref 7–25)
CO2: 27 mmol/L (ref 20–32)
Calcium: 10.1 mg/dL (ref 8.6–10.3)
Chloride: 96 mmol/L — ABNORMAL LOW (ref 98–110)
Creat: 1.02 mg/dL (ref 0.70–1.30)
Globulin: 4.3 g/dL (calc) — ABNORMAL HIGH (ref 1.9–3.7)
Glucose, Bld: 271 mg/dL — ABNORMAL HIGH (ref 65–99)
Potassium: 4.6 mmol/L (ref 3.5–5.3)
Sodium: 133 mmol/L — ABNORMAL LOW (ref 135–146)
Total Bilirubin: 0.5 mg/dL (ref 0.2–1.2)
Total Protein: 8.7 g/dL — ABNORMAL HIGH (ref 6.1–8.1)
eGFR: 86 mL/min/{1.73_m2} (ref >=60)

## 2022-12-19 LAB — HEMOGLOBIN A1C
Hgb A1c MFr Bld: 11.2 % of total Hgb — ABNORMAL HIGH (ref <5.7)
Mean Plasma Glucose: 275 mg/dL
eAG (mmol/L): 15.2 mmol/L

## 2022-12-19 LAB — LIPID PANEL
Cholesterol: 190 mg/dL (ref <200)
HDL: 33 mg/dL — ABNORMAL LOW (ref >=40)
LDL Cholesterol (Calc): 106 mg/dL (calc) — ABNORMAL HIGH
Non-HDL Cholesterol (Calc): 157 mg/dL (calc) — ABNORMAL HIGH (ref <130)
Total CHOL/HDL Ratio: 5.8 (calc) — ABNORMAL HIGH (ref <5.0)
Triglycerides: 383 mg/dL — ABNORMAL HIGH (ref <150)

## 2022-12-21 ENCOUNTER — Other Ambulatory Visit (HOSPITAL_COMMUNITY): Payer: Self-pay

## 2022-12-24 ENCOUNTER — Telehealth: Payer: Self-pay

## 2022-12-24 NOTE — Telephone Encounter (Signed)
Faxed completed forms to insurance.

## 2022-12-26 NOTE — Telephone Encounter (Signed)
Prior auth approved through 12/26/23.

## 2022-12-27 ENCOUNTER — Other Ambulatory Visit (HOSPITAL_COMMUNITY): Payer: Self-pay

## 2023-02-01 ENCOUNTER — Other Ambulatory Visit: Payer: Self-pay

## 2023-02-28 ENCOUNTER — Encounter: Payer: Self-pay | Admitting: Nurse Practitioner

## 2023-03-11 ENCOUNTER — Other Ambulatory Visit: Payer: Self-pay | Admitting: Internal Medicine

## 2023-03-11 DIAGNOSIS — M1000 Idiopathic gout, unspecified site: Secondary | ICD-10-CM

## 2023-03-12 ENCOUNTER — Encounter: Payer: Self-pay | Admitting: Nurse Practitioner

## 2023-03-13 ENCOUNTER — Other Ambulatory Visit (HOSPITAL_COMMUNITY): Payer: Self-pay

## 2023-03-25 NOTE — Progress Notes (Signed)
 Complete Physical  Assessment and Plan:  Zarius was seen today for annual exam.  Diagnoses and all orders for this visit:  Encounter for routine adult health examination without abnormal findings Due annually   Atherosclerosis of aorta Per CT 2016 Control blood pressure, cholesterol, glucose, increase exercise.   Primary hypertension Continue medication: Benazepril 20 mg, Ziac 5/6.25 mg  Monitor blood pressure at home; call if consistently over 130/80 Continue DASH diet.   Reminder to go to the ER if any CP, SOB, nausea, dizziness, severe HA, changes vision/speech, left arm numbness and tingling and jaw pain. 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  Start Ozempic 1 mg SQ QW- given info on savings card Levemir is being discontinued, switched to Basaglar 24 units QD Education: Reviewed 'ABCs' of diabetes management (respective goals in parentheses):  A1C (<7), blood pressure (<130/80), and cholesterol (LDL <70) Eye Exam yearly and Dental Exam every 6 months. Dietary recommendations Physical Activity recommendations - Strongly advised to start checking sugars at different times of the day - check 2 times a day, rotating checks - given sugar log and advised how to fill it and to bring it at next appt  - given foot care handout and explained the principles  - given instructions for hypoglycemia management  -     COMPLETE METABOLIC PANEL WITH GFR -     Hemoglobin A1c -     Microalbumin / creatinine urine ratio -     Urinalysis, Routine w reflex microscopic  CKD stage 2 due to type 2 diabetes mellitus (HCC) Increase fluids, avoid NSAIDS, monitor sugars, will  monitor Continue ACEi/ARB -     COMPLETE METABOLIC PANEL WITH GFR -     Microalbumin / creatinine urine ratio -     Urinalysis, Routine w reflex microscopic  Hyperlipidemia associated with type 2 diabetes mellitus (HCC) Continue medications- Rosuvastatin 20 mg QD LDL goal <70 Continue low cholesterol diet and exercise.  Check lipid panel.  -     Lipid panel -     TSH  Poorly controlled type 2 diabetes mellitus with peripheral neuropathy (HCC) Declines medications at this time, discussed feet checks.  -     Hemoglobin A1c -     HM DIABETES FOOT EXAM  Idiopathic gout, unspecified chronicity, unspecified site Continue allopurinol Diet discussed   Obesity (BMI 30.0-34.9) Long discussion about weight loss, diet, and exercise Recommended diet heavy in fruits and veggies and low in animal meats, cheeses, and dairy products, appropriate calorie intake Patient will work on high fiber, portions, continue ozempic, perceives benefit with phentermine but will plan to taper/d/c as weight trends down Discussed appropriate weight for height  Follow up at next visit  Left shoulder pain - Left shoulder x ray ordered, if no abnormalities will refer to ortho for evaluation  Vitamin D deficiency Continue Vit D supplementation to maintain value in therapeutic level of 60-100  -     VITAMIN D 25 Hydroxy (Vit-D Deficiency, Fractures)  Screening for hematuria or proteinuria -  Microalbumin / creatinine urine ratio -     Urinalysis, Routine w reflex microscopic  Screening for ischemic heart disease -     EKG 12-Lead  Screening for prostate cancer -     PSA  Screening for AAA - U/S ABD Retroperitoneal LTD     Discussed med's effects and SE's. Screening labs and tests as requested with regular follow-up as recommended. Over 40 minutes of exam, counseling, chart review and critical decision making was performed  Future Appointments  Date Time Provider Department Center  03/25/2024  10:00 AM Raynelle Dick, NP [facility] None    HPI 57 y.o. male patient presents for a complete physical. He has Hypertension; Hyperlipidemia associated with type 2 diabetes mellitus (HCC); Vitamin D deficiency; Medication management; Type 2 DM with CKD and hypertension; Gout; CKD stage 1 due to type 2 diabetes mellitus (HCC); Former cigar smoker; Obesity (BMI 30.0-34.9); Poorly controlled type 2 diabetes mellitus with peripheral neuropathy (HCC); and Abdominal aortic atherosclerosis (HCC) on their problem list.  He is married, grown daughter, 1 grandson just turned 4. He works as a Financial risk analyst.   He fell and stabilized himself on left arm several months ago and has persistent stiffness and aching of left shoulder.   He is no longer smoking cigars, quit 2019.   BMI is Body mass index is 33.56 kg/m., he has been working on diet and exercise. Prescribed phentermine, weights trending down.  He has not been exercising as much Wt Readings from Last 3 Encounters:  03/26/23 254 lb 6.4 oz (115.4 kg)  12/18/22 255 lb 9.6 oz (115.9 kg)  09/04/22 255 lb 9.6 oz (115.9 kg)   He has aortic atherosclerosis per CT abd 2016  His blood pressure has been controlled at home on benazepril 20 mg QD and Ziac 5/6.25 mg QD, today their BP is BP: 120/74 BP Readings from Last 3 Encounters:  03/26/23 120/74  12/18/22 138/80  09/04/22 106/68  He does workout. He denies chest pain, shortness of breath, dizziness.    He is on cholesterol medication (Rosuvastatin 10 mg daily, was not taking consistently- was increased to 20 mg daily after last visit labs) and denies myalgias. His cholesterol is not at goal. The cholesterol last visit was:   Lab Results  Component Value Date   CHOL 190 12/18/2022   HDL 33 (L) 12/18/2022   LDLCALC 106 (H) 12/18/2022   TRIG 383 (H) 12/18/2022   CHOLHDL 5.8 (H) 12/18/2022   He has been working on diet and exercise for uncontrolled T2DM, currently treated by metformin 2000 mg daily,  levimir 24 units( this is to be discontinued by producer), trulicity 3 mg no SE but unable to get due to supply issues. , he is on bASA, he is on ACE/ARB and denies hypoglycemia , increased appetite, nausea,  polydipsia, polyuria and visual disturbances. Has noticed occasional tingling in feet and hands.  Fasting running 105-130  Last A1C in the office was:  Lab Results  Component Value Date   HGBA1C 11.2 (H) 12/18/2022   Last GFR: Lab Results  Component Value Date   EGFR 86 12/18/2022     Patient is on Vitamin D supplement, taking 16109 IU daily  Lab Results  Component Value Date   VD25OH 40 02/27/2022     Denies noctura or LUTs. Last PSA was: Lab Results  Component Value Date   PSA 0.21 02/27/2022      Current Medications:  Current Outpatient Medications on File Prior to Visit  Medication Sig Dispense Refill   allopurinol (ZYLOPRIM) 300 MG tablet TAKE 1 TABLET BY MOUTH EVERY DAY FOR GOUT PREVENTION 90 tablet 3   aspirin 81 MG tablet Take 81 mg by mouth daily.     benazepril (LOTENSIN) 20 MG tablet Take  1  tablet  Daily  for BP & Diabetic Kidney Protection 90 tablet 3   bisoprolol-hydrochlorothiazide (ZIAC) 5-6.25 MG tablet Take  1 tablet  Daily   for BP                                                                      /                                       TAKE                                  BY                                      MOUTH 90 tablet 3   Blood Glucose Monitoring Suppl (FREESTYLE LITE) DEVI 1 device to check sugars, patient on insulin 1 each 0   Blood Glucose Monitoring Suppl (ONETOUCH VERIO REFLECT) w/Device KIT USE TO TEST BLODD SUGAR LEVELS DAILY 1 kit 1   CHOLECALCIFEROL PO Take 15,000 Units by mouth daily.     Continuous Blood Gluc Receiver (FREESTYLE LIBRE 14 DAY READER) DEVI 1 Device by Does not apply route every 14 (fourteen) days. 2 each 4   Continuous Blood Gluc Sensor (FREESTYLE LIBRE 14 DAY SENSOR) MISC 1 Units by Does not apply route every 14  (fourteen) days. 3 each 12   fenofibrate micronized (LOFIBRA) 200 MG capsule Take  1 capsule  Daily  for Triglycerides  (Blood Fats)                                                            /                                   TAKE                                              BY                                         MOUTH 90 capsule 3   glucose blood (FREESTYLE LITE) test strip Test sugar 3 x daily due to hyperglycemia and insulin use. E11.22 100 each 3   ibuprofen (  ADVIL) 800 MG tablet Take 800 mg by mouth 3 (three) times daily.     Insulin Pen Needle (ULTRA FLO INSULIN PEN NEEDLES) 31G X 5 MM MISC Use to inject insulin as directed. 100 each 2   Lancets MISC Test blood sugar once daily 200 each 11   Magnesium 500 MG TABS Take by mouth daily.     metFORMIN (GLUCOPHAGE-XR) 500 MG 24 hr tablet TAKE 2 TABLETS TWICE A DAY FOR DIABETES 360 tablet 3   Multiple Vitamin (MULTIVITAMIN WITH MINERALS) TABS tablet Take 1 tablet by mouth daily.     phentermine (ADIPEX-P) 37.5 MG tablet TAKE 1 TABLET EVERY MORNING FOR DIETING & WEIGHT LOSS 90 tablet 1   vitamin B-12 (CYANOCOBALAMIN) 100 MCG tablet Take 100 mcg by mouth daily.     doxycycline (VIBRA-TABS) 100 MG tablet Take 1 tablet (100 mg total) by mouth 2 (two) times daily. 20 tablet 1   Current Facility-Administered Medications on File Prior to Visit  Medication Dose Route Frequency Provider Last Rate Last Admin   0.9 %  sodium chloride infusion  500 mL Intravenous Once Hilarie Fredrickson, MD       Allergies:  Allergies  Allergen Reactions   Ppd [Tuberculin Purified Protein Derivative]     + PPD 2010   Welchol [Colesevelam Hcl]     Chest pain   Health Maintenance:  Immunization History  Administered Date(s) Administered   Influenza Inj Mdck Quad With Preservative 09/17/2017, 09/16/2018, 09/29/2019, 08/16/2020   Influenza Split 09/05/2015   Influenza,inj,Quad PF,6+ Mos 09/12/2021, 09/04/2022   Influenza,inj,quad, With Preservative 10/22/2016    Moderna Sars-Covid-2 Vaccination 12/01/2019, 12/29/2019   Pneumococcal Polysaccharide-23 09/05/2015   Pneumococcal-Unspecified 11/22/2009   Td 09/17/2017   Tdap 11/22/2006, 09/12/2021   Zoster Recombinat (Shingrix) 10/20/2021, 02/06/2022   Health Maintenance  Topic Date Due   COVID-19 Vaccine (3 - Moderna risk series) 01/26/2020   OPHTHALMOLOGY EXAM  02/18/2022   Diabetic kidney evaluation - Urine ACR  02/28/2023   INFLUENZA VACCINE  06/13/2023   HEMOGLOBIN A1C  06/18/2023   Diabetic kidney evaluation - eGFR measurement  12/19/2023   FOOT EXAM  03/25/2024   DTaP/Tdap/Td (4 - Td or Tdap) 09/13/2031   COLONOSCOPY (Pts 45-68yrs Insurance coverage will need to be confirmed)  07/24/2032   HIV Screening  Completed   Zoster Vaccines- Shingrix  Completed   HPV VACCINES  Aged Out   Hepatitis C Screening  Discontinued     Patient Care Team: Lucky Cowboy, MD as PCP - General (Internal Medicine)  Medical History:  has Hypertension; Hyperlipidemia associated with type 2 diabetes mellitus (HCC); Vitamin D deficiency; Medication management; Type 2 DM with CKD and hypertension; Gout; CKD stage 1 due to type 2 diabetes mellitus (HCC); Former cigar smoker; Obesity (BMI 30.0-34.9); Poorly controlled type 2 diabetes mellitus with peripheral neuropathy (HCC); and Abdominal aortic atherosclerosis (HCC) on their problem list. Surgical History:  He  has a past surgical history that includes Cystoscopy with retrograde pyelogram, ureteroscopy and stent placement (Right, 02/01/2015) and Holmium laser application (Right, 02/01/2015). Family History:  His family history includes Alzheimer's disease in his father; Asthma in his mother; Diabetes in his brother, father, and mother; Esophageal cancer in his paternal uncle; Hyperlipidemia in his brother; Hypertension in his brother, father, and mother; Stroke (age of onset: 23) in his father. Social History:   reports that he quit smoking about 4 years ago. His  smoking use included cigars. He has quit using smokeless tobacco.  His smokeless  tobacco use included snuff. He reports that he does not currently use alcohol after a past usage of about 4.0 standard drinks of alcohol per week. He reports that he does not use drugs.   Review of Systems:  Review of Systems  Constitutional:  Negative for malaise/fatigue and weight loss.  HENT:  Negative for hearing loss and tinnitus.   Eyes:  Negative for blurred vision and double vision.  Respiratory:  Negative for cough, sputum production, shortness of breath and wheezing.   Cardiovascular:  Negative for chest pain, palpitations, orthopnea, claudication, leg swelling and PND.  Gastrointestinal:  Negative for abdominal pain, blood in stool, constipation, diarrhea, heartburn, melena, nausea and vomiting.  Genitourinary: Negative.   Musculoskeletal:  Positive for joint pain (left shoulder). Negative for falls and myalgias.  Skin:  Negative for rash.  Neurological:  Positive for tingling (occ left fingers). Negative for dizziness, sensory change, weakness and headaches.  Endo/Heme/Allergies:  Negative for polydipsia.  Psychiatric/Behavioral: Negative.  Negative for depression, memory loss, substance abuse and suicidal ideas. The patient is not nervous/anxious and does not have insomnia.   All other systems reviewed and are negative.   Physical Exam: Estimated body mass index is 33.56 kg/m as calculated from the following:   Height as of this encounter: 6\' 1"  (1.854 m).   Weight as of this encounter: 254 lb 6.4 oz (115.4 kg). BP 120/74   Pulse 62   Temp (!) 97.1 F (36.2 C)   Ht 6\' 1"  (1.854 m)   Wt 254 lb 6.4 oz (115.4 kg)   SpO2 99%   BMI 33.56 kg/m  General Appearance: Obese, pleasant male  in no apparent distress.  Eyes: PERRLA, EOMs, conjunctiva no swelling or erythema Sinuses: No Frontal/maxillary tenderness  ENT/Mouth: Ext aud canals clear, normal light reflex with TMs without erythema, bulging.  Good dentition. No erythema, swelling, or exudate on post pharynx. Tonsils not swollen or erythematous. Hearing normal.  Neck: Supple, thyroid normal. No bruits. He has unchanged soft tissue mass to right anterior neck.  Respiratory: Respiratory effort normal, BS equal bilaterally without rales, rhonchi, wheezing or stridor.  Cardio: RRR without murmurs, rubs or gallops. Brisk peripheral pulses without edema.  Chest: symmetric, with normal excursions and percussion.  Abdomen: Soft, nontender, no guarding, rebound,  masses, or organomegaly. Small ventral hernia with bearing down Lymphatics: Non tender without lymphadenopathy.  Genitourinary: doing self exams, no concerns, declines Musculoskeletal: Full ROM all peripheral extremities,5/5 strength except left shoulder. Slightly limited ROM, mild tenderness when lifting overhead Skin: Warm, dry without rashes, lesions, ecchymosis. Neuro: Cranial nerves intact, reflexes equal bilaterally. Normal muscle tone, no cerebellar symptoms. Sensation diminished bil feet to monofilament.  Psych: Awake and oriented X 3, normal affect, Insight and Judgment appropriate.   EKG: NSR, 1st degree AVB, No ST changes AAA: < 3 cm  Sudie Bandel E  11:06 AM [facility]

## 2023-03-26 ENCOUNTER — Ambulatory Visit
Admission: RE | Admit: 2023-03-26 | Discharge: 2023-03-26 | Disposition: A | Discharge status: Home / Self Care | Payer: 59 | Source: Ambulatory Visit | Attending: Nurse Practitioner | Admitting: Nurse Practitioner

## 2023-03-26 ENCOUNTER — Ambulatory Visit (INDEPENDENT_AMBULATORY_CARE_PROVIDER_SITE_OTHER): Payer: PRIVATE HEALTH INSURANCE | Admitting: Nurse Practitioner

## 2023-03-26 ENCOUNTER — Encounter: Payer: Self-pay | Admitting: Nurse Practitioner

## 2023-03-26 VITALS — BP 120/74 | HR 62 | Temp 97.1°F | Ht 73.0 in | Wt 254.4 lb

## 2023-03-26 DIAGNOSIS — I10 Essential (primary) hypertension: Secondary | ICD-10-CM | POA: Diagnosis not present

## 2023-03-26 DIAGNOSIS — Z136 Encounter for screening for cardiovascular disorders: Secondary | ICD-10-CM

## 2023-03-26 DIAGNOSIS — M25512 Pain in left shoulder: Secondary | ICD-10-CM

## 2023-03-26 DIAGNOSIS — Z125 Encounter for screening for malignant neoplasm of prostate: Secondary | ICD-10-CM | POA: Diagnosis not present

## 2023-03-26 DIAGNOSIS — Z13220 Encounter for screening for lipoid disorders: Secondary | ICD-10-CM | POA: Diagnosis not present

## 2023-03-26 DIAGNOSIS — Z131 Encounter for screening for diabetes mellitus: Secondary | ICD-10-CM

## 2023-03-26 DIAGNOSIS — Z1389 Encounter for screening for other disorder: Secondary | ICD-10-CM

## 2023-03-26 DIAGNOSIS — E1122 Type 2 diabetes mellitus with diabetic chronic kidney disease: Secondary | ICD-10-CM

## 2023-03-26 DIAGNOSIS — Z79899 Other long term (current) drug therapy: Secondary | ICD-10-CM | POA: Diagnosis not present

## 2023-03-26 DIAGNOSIS — E66811 Obesity, class 1: Secondary | ICD-10-CM

## 2023-03-26 DIAGNOSIS — E1165 Type 2 diabetes mellitus with hyperglycemia: Secondary | ICD-10-CM

## 2023-03-26 DIAGNOSIS — E559 Vitamin D deficiency, unspecified: Secondary | ICD-10-CM | POA: Diagnosis not present

## 2023-03-26 DIAGNOSIS — E785 Hyperlipidemia, unspecified: Secondary | ICD-10-CM

## 2023-03-26 DIAGNOSIS — R350 Frequency of micturition: Secondary | ICD-10-CM

## 2023-03-26 DIAGNOSIS — Z0001 Encounter for general adult medical examination with abnormal findings: Secondary | ICD-10-CM

## 2023-03-26 DIAGNOSIS — E11628 Type 2 diabetes mellitus with other skin complications: Secondary | ICD-10-CM

## 2023-03-26 DIAGNOSIS — I440 Atrioventricular block, first degree: Secondary | ICD-10-CM

## 2023-03-26 DIAGNOSIS — M1000 Idiopathic gout, unspecified site: Secondary | ICD-10-CM

## 2023-03-26 DIAGNOSIS — Z1329 Encounter for screening for other suspected endocrine disorder: Secondary | ICD-10-CM

## 2023-03-26 DIAGNOSIS — E1169 Type 2 diabetes mellitus with other specified complication: Secondary | ICD-10-CM

## 2023-03-26 DIAGNOSIS — Z0000 Encounter for general adult medical examination without abnormal findings: Secondary | ICD-10-CM

## 2023-03-26 DIAGNOSIS — I700 Atherosclerosis of aorta: Secondary | ICD-10-CM

## 2023-03-26 DIAGNOSIS — E1142 Type 2 diabetes mellitus with diabetic polyneuropathy: Secondary | ICD-10-CM

## 2023-03-26 DIAGNOSIS — N401 Enlarged prostate with lower urinary tract symptoms: Secondary | ICD-10-CM | POA: Diagnosis not present

## 2023-03-26 DIAGNOSIS — E669 Obesity, unspecified: Secondary | ICD-10-CM

## 2023-03-26 MED ORDER — BASAGLAR KWIKPEN 100 UNIT/ML ~~LOC~~ SOPN
24.0000 [IU] | PEN_INJECTOR | Freq: Every day | SUBCUTANEOUS | 3 refills | Status: DC
Start: 2023-03-26 — End: 2023-11-11

## 2023-03-26 MED ORDER — SEMAGLUTIDE (1 MG/DOSE) 4 MG/3ML ~~LOC~~ SOPN
1.0000 mg | PEN_INJECTOR | SUBCUTANEOUS | 3 refills | Status: DC
Start: 2023-03-26 — End: 2023-07-02

## 2023-03-26 MED ORDER — ROSUVASTATIN CALCIUM 20 MG PO TABS
20.0000 mg | ORAL_TABLET | Freq: Every day | ORAL | 11 refills | Status: DC
Start: 2023-03-26 — End: 2023-03-29

## 2023-03-26 NOTE — Patient Instructions (Addendum)
[email or web address] Go to savings section and sign up

## 2023-03-27 ENCOUNTER — Telehealth: Payer: Self-pay

## 2023-03-27 LAB — CBC WITH DIFFERENTIAL/PLATELET
Absolute Monocytes: 507 cells/uL (ref 200–950)
Basophils Absolute: 91 cells/uL (ref 0–200)
Basophils Relative: 1.4 %
Eosinophils Absolute: 358 cells/uL (ref 15–500)
Eosinophils Relative: 5.5 %
HCT: 40.2 % (ref 38.5–50.0)
Hemoglobin: 13.3 g/dL (ref 13.2–17.1)
Lymphs Abs: 2516 cells/uL (ref 850–3900)
MCH: 29.7 pg (ref 27.0–33.0)
MCHC: 33.1 g/dL (ref 32.0–36.0)
MCV: 89.7 fL (ref 80.0–100.0)
MPV: 11.3 fL (ref 7.5–12.5)
Monocytes Relative: 7.8 %
Neutro Abs: 3029 cells/uL (ref 1500–7800)
Neutrophils Relative %: 46.6 %
Platelets: 278 10*3/uL (ref 140–400)
RBC: 4.48 10*6/uL (ref 4.20–5.80)
RDW: 12.8 % (ref 11.0–15.0)
Total Lymphocyte: 38.7 %
WBC: 6.5 10*3/uL (ref 3.8–10.8)

## 2023-03-27 LAB — URINALYSIS, ROUTINE W REFLEX MICROSCOPIC
Bilirubin Urine: NEGATIVE
Glucose, UA: NEGATIVE
Hgb urine dipstick: NEGATIVE
Ketones, ur: NEGATIVE
Leukocytes,Ua: NEGATIVE
Nitrite: NEGATIVE
Protein, ur: NEGATIVE
Specific Gravity, Urine: 1.018 (ref 1.001–1.035)
pH: <=5 (ref 5.0–8.0)

## 2023-03-27 LAB — COMPLETE METABOLIC PANEL WITH GFR
AG Ratio: 1.2 (calc) (ref 1.0–2.5)
ALT: 22 U/L (ref 9–46)
AST: 21 U/L (ref 10–35)
Albumin: 4.6 g/dL (ref 3.6–5.1)
Alkaline phosphatase (APISO): 85 U/L (ref 35–144)
BUN: 17 mg/dL (ref 7–25)
CO2: 27 mmol/L (ref 20–32)
Calcium: 10.4 mg/dL — ABNORMAL HIGH (ref 8.6–10.3)
Chloride: 102 mmol/L (ref 98–110)
Creat: 0.9 mg/dL (ref 0.70–1.30)
Globulin: 3.7 g/dL (calc) (ref 1.9–3.7)
Glucose, Bld: 113 mg/dL — ABNORMAL HIGH (ref 65–99)
Potassium: 4.8 mmol/L (ref 3.5–5.3)
Sodium: 139 mmol/L (ref 135–146)
Total Bilirubin: 0.4 mg/dL (ref 0.2–1.2)
Total Protein: 8.3 g/dL — ABNORMAL HIGH (ref 6.1–8.1)
eGFR: 100 mL/min/{1.73_m2} (ref >=60)

## 2023-03-27 LAB — MAGNESIUM: Magnesium: 1.6 mg/dL (ref 1.5–2.5)

## 2023-03-27 LAB — HEMOGLOBIN A1C
Hgb A1c MFr Bld: 8.2 % of total Hgb — ABNORMAL HIGH (ref <5.7)
Mean Plasma Glucose: 189 mg/dL
eAG (mmol/L): 10.4 mmol/L

## 2023-03-27 LAB — MICROALBUMIN / CREATININE URINE RATIO
Creatinine, Urine: 93 mg/dL (ref 20–320)
Microalb Creat Ratio: 35 mg/g creat — ABNORMAL HIGH (ref <30)
Microalb, Ur: 3.3 mg/dL

## 2023-03-27 LAB — PSA: PSA: 0.23 ng/mL (ref <=4.00)

## 2023-03-27 LAB — LIPID PANEL
Cholesterol: 121 mg/dL (ref <200)
HDL: 40 mg/dL (ref >=40)
LDL Cholesterol (Calc): 62 mg/dL (calc)
Non-HDL Cholesterol (Calc): 81 mg/dL (calc) (ref <130)
Total CHOL/HDL Ratio: 3 (calc) (ref <5.0)
Triglycerides: 106 mg/dL (ref <150)

## 2023-03-27 LAB — TSH: TSH: 2.12 mIU/L (ref 0.40–4.50)

## 2023-03-27 LAB — VITAMIN D 25 HYDROXY (VIT D DEFICIENCY, FRACTURES): Vit D, 25-Hydroxy: 41 ng/mL (ref 30–100)

## 2023-03-27 NOTE — Telephone Encounter (Signed)
Prior Authorization for Rosuvastatin and Ozempic submitted through Cover My Meds

## 2023-03-29 ENCOUNTER — Other Ambulatory Visit: Payer: Self-pay | Admitting: Nurse Practitioner

## 2023-03-29 DIAGNOSIS — E1169 Type 2 diabetes mellitus with other specified complication: Secondary | ICD-10-CM

## 2023-03-29 MED ORDER — ATORVASTATIN CALCIUM 40 MG PO TABS
40.0000 mg | ORAL_TABLET | Freq: Every day | ORAL | 11 refills | Status: DC
Start: 2023-03-29 — End: 2023-06-21

## 2023-03-29 NOTE — Telephone Encounter (Signed)
Rosuvastatin PA denied. Ozempic PA approved.

## 2023-04-30 ENCOUNTER — Other Ambulatory Visit: Payer: Self-pay | Admitting: Internal Medicine

## 2023-04-30 DIAGNOSIS — E669 Obesity, unspecified: Secondary | ICD-10-CM

## 2023-06-15 ENCOUNTER — Other Ambulatory Visit: Payer: Self-pay | Admitting: Nurse Practitioner

## 2023-06-21 ENCOUNTER — Other Ambulatory Visit: Payer: Self-pay | Admitting: Nurse Practitioner

## 2023-06-21 DIAGNOSIS — E1169 Type 2 diabetes mellitus with other specified complication: Secondary | ICD-10-CM

## 2023-06-21 MED ORDER — ATORVASTATIN CALCIUM 40 MG PO TABS: 40.0000 mg | ORAL_TABLET | Freq: Every day | ORAL | 3 refills | Status: DC

## 2023-07-01 NOTE — Progress Notes (Unsigned)
FOLLOW UP 3 MONTH   Assessment and Plan:   Essential hypertension Continue current medications: Benazepril 20 mg every day, Ziac 5/6.25 mg every day Monitor blood pressure at home; call if consistently over 130/80 Continue DASH diet.   Reminder to go to the ER if any CP, SOB, nausea, dizziness, severe HA, changes vision/speech, left arm numbness and tingling and jaw pain. -     CBC with Diff -     COMPLETE METABOLIC PANEL WITH GFR   Hyperlipidemia associated with type 2 diabetes mellitus (HCC) Continue medications: Atorvastatin 40 mg every day and fenofibrate 200 mg every day  Discussed dietary and exercise modifications Low fat diet -     Lipid Profile  Type 2 DM with CKD stage 2 and hypertension (HCC) Discussed general issues about diabetes pathophysiology and management. Education: Reviewed 'ABCs' of diabetes management (respective goals in parentheses):  A1C (<7), blood pressure (<130/80), and cholesterol (LDL <70) Dietary recommendations Metformin 500 mg 2 tabs BID Basaglar 26 units QAM Not checking blood sugar- strongly encouraged to check daily, Stressed the importance on knowing blood sugars for insulin adjusting Encouraged aerobic exercise.  Discussed foot care, check daily Yearly retinal exam Dental exam every 6 months Monitor blood glucose, discussed goal for patient -     Hemoglobin A1c ([facility])  CMP  CKD stage 2 due to type 2 diabetes mellitus (HCC) Continue glucose monitoring Continue medications Increase fluids Avoid NSAIDS Discussed dietary modifications and exercise Will continue to monitor  Abdominal Aortic Atherosclerosis(HCC) Monitor and control blood pressure, blood sugars, cholesterol and weight  Poorly controlled type 2 diabetes mellitus with peripheral neuropathy (HCC) Strongly encourage to check blood sugars daily Checks feet daily   Vitamin D deficiency Continue supplementation Taking Vitamin D 4,000 IU daily   Obesity (BMI  30.0-34.9)with Type 2 Diabetes Mellitus(HCC) Long discussion about weight loss, diet, and exercise Recommended diet heavy in fruits and veggies and low in animal meats, cheeses, and dairy products, appropriate calorie intake Patient will work on decreasing saturated fats, simple carbs and increasing protein and activity Follow up at next visit   Medication management -     CBC with Differential/Platelet -     COMPLETE METABOLIC PANEL WITH GFR -     Lipid panel -     Hemoglobin A1C w/out eAG  Acute Pain of both shoulders Continue Tylenol/advil alternation Rest as possible Heat and ice Declines ortho referral      Continue diet and meds as discussed. Further disposition pending results of labs. Discussed med's effects and SE's.  Patient agrees with plan of care and opportunity to ask questions/voice concerns. Over 30 minutes of chart review, interview, exam, counseling, and critical decision making was performed.   Future Appointments  Date Time Provider Department Center  03/25/2024 10:00 AM Raynelle Dick, NP [facility] None    ----------------------------------------------------------------------------------------------------------------------  HPI 57 y.o. male  presents for 3 month follow up on HTN, HLD, DMII, gout, weight and vitamin D deficiency.   Left shoulder xray did show osteoarthritis and possible bone spur- he does use some Advil for relief.  His right shoulder started hurting approx 1 month ago, describes as an aching sensation that can occur with use or rest. The advil does help.  He does not wish to see orthopedics.    BMI is Body mass index is 33.43 kg/m., he has not been working on diet and exercise. Trulicity and Ozempic are not covered by insurance, he  is trying to eat less carbs,  has not been exercising as much. He has Phentermine but rarely uses.  Wt Readings from Last 3 Encounters:  07/02/23 253 lb 6.4 oz (114.9 kg)  03/26/23 254 lb 6.4 oz (115.4 kg)   12/18/22 255 lb 9.6 oz (115.9 kg)    His blood pressure has not been controlled at home, today their BP is BP: 106/64  BP Readings from Last 3 Encounters:  07/02/23 106/64  03/26/23 120/74  12/18/22 138/80  He does not workout. He denies any cardiac symptoms, chest pains, palpitations, shortness of breath, dizziness or lower extremity edema.      He is on cholesterol medication , Rosuvastatin 10 mg QD and fenofibrate 200 mg QD, and denies myalgias.   His cholesterol is at goal of less than 70. The cholesterol last visit was:   Lab Results  Component Value Date   CHOL 121 03/26/2023   HDL 40 03/26/2023   LDLCALC 62 03/26/2023   TRIG 106 03/26/2023   CHOLHDL 3.0 03/26/2023    He has not been working on diet and exercise for DMII - he has been reducing pasta and potatoes.  Has started drinking sweet tea- is trying to cut back  Normal kidney function Hyperlipidemia With neuropathy Has not been checking his blood sugars- does have supplies Insurance did not cover trulicity or ozempic He is taking Basaglar 26 units daily every AM. He has not been checking his blood sugars He is also taking Meformin XR 500mg  two tablets twice a day.  He is behind 1 year on Eye exam He will occasionally get itch in feet and fingers and denies polydipsia, polyuria, visual disturbances, vomiting and weight loss.     Last A1C in the office was:  Lab Results  Component Value Date   HGBA1C 8.2 (H) 03/26/2023    He is in stage 2 CKD and tries to drink increased fluid throughout the day Lab Results  Component Value Date   EGFR 100 03/26/2023     Patient is on Vitamin D supplement for deficiency and taking 4,000IU daily.   Lab Results  Component Value Date   VD25OH 41 03/26/2023       Current Medications:   Current Outpatient Medications (Endocrine & Metabolic):    Insulin Glargine (BASAGLAR KWIKPEN) 100 UNIT/ML, Inject 24 Units into the skin daily.   metFORMIN (GLUCOPHAGE-XR) 500 MG 24 hr  tablet, TAKE 2 TABLETS TWICE A DAY FOR DIABETES   Semaglutide, 1 MG/DOSE, 4 MG/3ML SOPN, Inject 1 mg into the skin once a week. (Patient not taking: Reported on 07/02/2023)   Current Outpatient Medications (Cardiovascular):    atorvastatin (LIPITOR) 40 MG tablet, Take 1 tablet (40 mg total) by mouth daily.   benazepril (LOTENSIN) 20 MG tablet, Take  1  tablet  Daily  for BP & Diabetic Kidney Protection   bisoprolol-hydrochlorothiazide (ZIAC) 5-6.25 MG tablet, Take  1 tablet  Daily   for BP                                                                      /  TAKE                                  BY                                      MOUTH   fenofibrate micronized (LOFIBRA) 200 MG capsule, Take  1 capsule  Daily  for Triglycerides  (Blood Fats)                                                            /                                   TAKE                                              BY                                         MOUTH     Current Outpatient Medications (Analgesics):    allopurinol (ZYLOPRIM) 300 MG tablet, TAKE 1 TABLET BY MOUTH EVERY DAY FOR GOUT PREVENTION   aspirin 81 MG tablet, Take 81 mg by mouth daily.   ibuprofen (ADVIL) 800 MG tablet, Take 800 mg by mouth 3 (three) times daily.   Current Outpatient Medications (Hematological):    vitamin B-12 (CYANOCOBALAMIN) 100 MCG tablet, Take 100 mcg by mouth daily.   Current Outpatient Medications (Other):    Blood Glucose Monitoring Suppl (FREESTYLE LITE) DEVI, 1 device to check sugars, patient on insulin   Blood Glucose Monitoring Suppl (ONETOUCH VERIO REFLECT) w/Device KIT, USE TO TEST BLODD SUGAR LEVELS DAILY   CHOLECALCIFEROL PO, Take 15,000 Units by mouth daily.   Continuous Blood Gluc Receiver (FREESTYLE LIBRE 14 DAY READER) DEVI, 1 Device by Does not apply route every 14 (fourteen) days.   Continuous Blood Gluc Sensor (FREESTYLE LIBRE 14 DAY SENSOR) MISC, 1 Units by Does not  apply route every 14 (fourteen) days.   glucose blood (FREESTYLE LITE) test strip, Test sugar 3 x daily due to hyperglycemia and insulin use. E11.22   Insulin Pen Needle (ULTRA FLO INSULIN PEN NEEDLES) 31G X 5 MM MISC, Use to inject insulin as directed.   Lancets MISC, Test blood sugar once daily   Magnesium 500 MG TABS, Take by mouth daily.   Multiple Vitamin (MULTIVITAMIN WITH MINERALS) TABS tablet, Take 1 tablet by mouth daily.   phentermine (ADIPEX-P) 37.5 MG tablet, TAKE 1 TABLET EVERY MORNING FOR DIETING & WEIGHT LOSS  Current Facility-Administered Medications (Other):    0.9 %  sodium chloride infusion  Allergies:  Allergies  Allergen Reactions   Ppd [Tuberculin Purified Protein Derivative]     + PPD 2010   Welchol [Colesevelam Hcl]     Chest pain     Medical History:  Past Medical History:  Diagnosis Date  Carpal tunnel syndrome on both sides    Diabetes mellitus type 2, insulin dependent (HCC)    Diabetic ulcer of left foot associated with diabetes mellitus due to underlying condition, limited to breakdown of skin (HCC) 03/16/2019   Fatty liver disease, nonalcoholic    GERD (gastroesophageal reflux disease)    Gout    Hyperlipidemia    Hypertension    Kidney stone    Testosterone deficiency 01/25/2014   Vitamin D deficiency     Family history- Reviewed and unchanged Social history- Reviewed and unchanged   Review of Systems:  Review of Systems  Constitutional:  Negative for chills, fever and weight loss.  HENT:  Negative for congestion and hearing loss.   Eyes:  Negative for blurred vision and double vision.  Respiratory:  Negative for cough and shortness of breath.   Cardiovascular:  Negative for chest pain, palpitations, orthopnea and leg swelling.  Gastrointestinal:  Negative for abdominal pain, constipation, diarrhea, heartburn, nausea and vomiting.  Musculoskeletal:  Negative for falls, joint pain and myalgias.  Skin:  Negative for rash.  Neurological:   Negative for dizziness, tingling, tremors, loss of consciousness and headaches.  Psychiatric/Behavioral:  Negative for depression, memory loss and suicidal ideas.       Physical Exam: BP 106/64   Pulse 72   Temp (!) 97.5 F (36.4 C)   Ht 6\' 1"  (1.854 m)   Wt 253 lb 6.4 oz (114.9 kg)   SpO2 99%   BMI 33.43 kg/m  Wt Readings from Last 3 Encounters:  07/02/23 253 lb 6.4 oz (114.9 kg)  03/26/23 254 lb 6.4 oz (115.4 kg)  12/18/22 255 lb 9.6 oz (115.9 kg)   General Appearance: Well nourished, in no apparent distress. Eyes: PERRLA, EOMs, conjunctiva no swelling or erythema ENT/Mouth: Ext aud canals clear, TMs without erythema, bulging. Hearing normal.  Neck: Supple, thyroid normal.  Respiratory: Respiratory effort normal, BS equal bilaterally without rales, rhonchi, wheezing or stridor.  Cardio: RRR with no MRGs. Brisk peripheral pulses without edema.  Abdomen: Soft, + BS.  Non tender, no guarding, rebound, hernias, masses. Lymphatics: Non tender without lymphadenopathy.  Musculoskeletal: Full ROM, 5/5 strength, Normal gait. Decreased ROM and pain with left and right shoulder when reaching overhead Skin: Warm, dry. No skin lesions Neuro: Cranial nerves intact. No cerebellar symptoms.  Psych: Awake and oriented X 3, normal affect, Insight and Judgment appropriate.     Revonda Humphrey ANP-C  [facility]  07/02/2023

## 2023-07-02 ENCOUNTER — Ambulatory Visit (INDEPENDENT_AMBULATORY_CARE_PROVIDER_SITE_OTHER): Payer: Self-pay | Admitting: Nurse Practitioner

## 2023-07-02 ENCOUNTER — Encounter: Payer: Self-pay | Admitting: Nurse Practitioner

## 2023-07-02 VITALS — BP 106/64 | HR 72 | Temp 97.5°F | Ht 73.0 in | Wt 253.4 lb

## 2023-07-02 DIAGNOSIS — M25511 Pain in right shoulder: Secondary | ICD-10-CM

## 2023-07-02 DIAGNOSIS — E785 Hyperlipidemia, unspecified: Secondary | ICD-10-CM

## 2023-07-02 DIAGNOSIS — I700 Atherosclerosis of aorta: Secondary | ICD-10-CM

## 2023-07-02 DIAGNOSIS — M25512 Pain in left shoulder: Secondary | ICD-10-CM

## 2023-07-02 DIAGNOSIS — N181 Chronic kidney disease, stage 1: Secondary | ICD-10-CM

## 2023-07-02 DIAGNOSIS — Z79899 Other long term (current) drug therapy: Secondary | ICD-10-CM

## 2023-07-02 DIAGNOSIS — E1165 Type 2 diabetes mellitus with hyperglycemia: Secondary | ICD-10-CM

## 2023-07-02 DIAGNOSIS — E1122 Type 2 diabetes mellitus with diabetic chronic kidney disease: Secondary | ICD-10-CM

## 2023-07-02 DIAGNOSIS — N182 Chronic kidney disease, stage 2 (mild): Secondary | ICD-10-CM

## 2023-07-02 DIAGNOSIS — Z794 Long term (current) use of insulin: Secondary | ICD-10-CM

## 2023-07-02 DIAGNOSIS — E1169 Type 2 diabetes mellitus with other specified complication: Secondary | ICD-10-CM

## 2023-07-02 DIAGNOSIS — I10 Essential (primary) hypertension: Secondary | ICD-10-CM

## 2023-07-02 DIAGNOSIS — E11628 Type 2 diabetes mellitus with other skin complications: Secondary | ICD-10-CM

## 2023-07-02 DIAGNOSIS — E669 Obesity, unspecified: Secondary | ICD-10-CM

## 2023-07-02 DIAGNOSIS — E1142 Type 2 diabetes mellitus with diabetic polyneuropathy: Secondary | ICD-10-CM

## 2023-07-02 DIAGNOSIS — E559 Vitamin D deficiency, unspecified: Secondary | ICD-10-CM

## 2023-07-02 MED ORDER — METFORMIN HCL ER 500 MG PO TB24
ORAL_TABLET | ORAL | 3 refills | Status: DC
Start: 2023-07-02 — End: 2024-02-24

## 2023-07-02 NOTE — Patient Instructions (Signed)

## 2023-07-03 LAB — CBC WITH DIFFERENTIAL/PLATELET
Absolute Monocytes: 570 {cells}/uL (ref 200–950)
Basophils Absolute: 107 {cells}/uL (ref 0–200)
Basophils Relative: 1.6 %
Eosinophils Absolute: 335 {cells}/uL (ref 15–500)
Eosinophils Relative: 5 %
HCT: 42.1 % (ref 38.5–50.0)
Hemoglobin: 14.2 g/dL (ref 13.2–17.1)
Lymphs Abs: 2801 {cells}/uL (ref 850–3900)
MCH: 30.3 pg (ref 27.0–33.0)
MCHC: 33.7 g/dL (ref 32.0–36.0)
MCV: 90 fL (ref 80.0–100.0)
MPV: 10.7 fL (ref 7.5–12.5)
Monocytes Relative: 8.5 %
Neutro Abs: 2888 {cells}/uL (ref 1500–7800)
Neutrophils Relative %: 43.1 %
Platelets: 268 10*3/uL (ref 140–400)
RBC: 4.68 10*6/uL (ref 4.20–5.80)
RDW: 13 % (ref 11.0–15.0)
Total Lymphocyte: 41.8 %
WBC: 6.7 10*3/uL (ref 3.8–10.8)

## 2023-07-03 LAB — LIPID PANEL
Cholesterol: 166 mg/dL (ref <200)
HDL: 45 mg/dL (ref >=40)
LDL Cholesterol (Calc): 94 mg/dL
Non-HDL Cholesterol (Calc): 121 mg/dL (ref <130)
Total CHOL/HDL Ratio: 3.7 (calc) (ref <5.0)
Triglycerides: 178 mg/dL — ABNORMAL HIGH (ref <150)

## 2023-07-03 LAB — COMPLETE METABOLIC PANEL WITH GFR
AG Ratio: 1.3 (calc) (ref 1.0–2.5)
ALT: 25 U/L (ref 9–46)
AST: 20 U/L (ref 10–35)
Albumin: 4.5 g/dL (ref 3.6–5.1)
Alkaline phosphatase (APISO): 81 U/L (ref 35–144)
BUN: 13 mg/dL (ref 7–25)
CO2: 26 mmol/L (ref 20–32)
Calcium: 10.1 mg/dL (ref 8.6–10.3)
Chloride: 99 mmol/L (ref 98–110)
Creat: 0.84 mg/dL (ref 0.70–1.30)
Globulin: 3.4 g/dL (ref 1.9–3.7)
Glucose, Bld: 130 mg/dL — ABNORMAL HIGH (ref 65–99)
Potassium: 4.9 mmol/L (ref 3.5–5.3)
Sodium: 135 mmol/L (ref 135–146)
Total Bilirubin: 0.5 mg/dL (ref 0.2–1.2)
Total Protein: 7.9 g/dL (ref 6.1–8.1)
eGFR: 102 mL/min/{1.73_m2} (ref >=60)

## 2023-07-03 LAB — HEMOGLOBIN A1C W/OUT EAG: Hgb A1c MFr Bld: 9 %{Hb} — ABNORMAL HIGH (ref <5.7)

## 2023-07-08 ENCOUNTER — Other Ambulatory Visit: Payer: Self-pay | Admitting: Nurse Practitioner

## 2023-07-08 DIAGNOSIS — E669 Obesity, unspecified: Secondary | ICD-10-CM

## 2023-08-25 ENCOUNTER — Other Ambulatory Visit: Payer: Self-pay | Admitting: Nurse Practitioner

## 2023-08-25 DIAGNOSIS — E66811 Obesity, class 1: Secondary | ICD-10-CM

## 2023-09-15 ENCOUNTER — Other Ambulatory Visit: Payer: Self-pay | Admitting: Nurse Practitioner

## 2023-10-07 NOTE — Progress Notes (Unsigned)
FOLLOW UP 3 MONTH   Assessment and Plan:   Essential hypertension Continue current medications: Benazepril 20 mg every day, Ziac 5/6.25 mg every day Monitor blood pressure at home; call if consistently over 130/80 Continue DASH diet.   Reminder to go to the ER if any CP, SOB, nausea, dizziness, severe HA, changes vision/speech, left arm numbness and tingling and jaw pain. -     CBC with Diff -     COMPLETE METABOLIC PANEL WITH GFR   Hyperlipidemia associated with type 2 diabetes mellitus (HCC) Continue medications: Atorvastatin 40 mg every day and fenofibrate 200 mg every day  Discussed dietary and exercise modifications Low fat diet -     Lipid Profile  Type 2 DM with CKD stage 2 and hypertension (HCC) Discussed general issues about diabetes pathophysiology and management. Education: Reviewed 'ABCs' of diabetes management (respective goals in parentheses):  A1C (<7), blood pressure (<130/80), and cholesterol (LDL <70) Dietary recommendations Metformin 500 mg 2 tabs BID Basaglar 26 units QAM Not checking blood sugar- strongly encouraged to check daily, Stressed the importance on knowing blood sugars for insulin adjusting Encouraged aerobic exercise.  Discussed foot care, check daily Yearly retinal exam Dental exam every 6 months Monitor blood glucose, discussed goal for patient -     Hemoglobin A1c ([facility])  CMP  CKD stage 2 due to type 2 diabetes mellitus (HCC) Continue glucose monitoring Continue medications Increase fluids Avoid NSAIDS Discussed dietary modifications and exercise Will continue to monitor  Abdominal Aortic Atherosclerosis(HCC) Monitor and control blood pressure, blood sugars, cholesterol and weight  Poorly controlled type 2 diabetes mellitus with peripheral neuropathy (HCC) Strongly encourage to check blood sugars daily Checks feet daily   Vitamin D deficiency Continue supplementation Taking Vitamin D 4,000 IU daily   Obesity (BMI  30.0-34.9)with Type 2 Diabetes Mellitus(HCC) Long discussion about weight loss, diet, and exercise Recommended diet heavy in fruits and veggies and low in animal meats, cheeses, and dairy products, appropriate calorie intake Patient will work on decreasing saturated fats, simple carbs and increasing protein and activity Follow up at next visit   Medication management -     CBC with Differential/Platelet -     COMPLETE METABOLIC PANEL WITH GFR -     Lipid panel -     Hemoglobin A1C w/out eAG  Acute Pain of both shoulders Continue Tylenol/advil alternation Rest as possible Heat and ice Declines ortho referral      Continue diet and meds as discussed. Further disposition pending results of labs. Discussed med's effects and SE's.  Patient agrees with plan of care and opportunity to ask questions/voice concerns. Over 30 minutes of chart review, interview, exam, counseling, and critical decision making was performed.   Future Appointments  Date Time Provider Department Center  10/08/2023  9:30 AM Raynelle Dick, NP [facility] None  03/25/2024 10:00 AM Raynelle Dick, NP [facility] None    ----------------------------------------------------------------------------------------------------------------------  HPI 57 y.o. male  presents for 3 month follow up on HTN, HLD, DMII, gout, weight and vitamin D deficiency.   Left shoulder xray did show osteoarthritis and possible bone spur- he does use some Advil for relief.  His right shoulder started hurting approx 1 month ago, describes as an aching sensation that can occur with use or rest. The advil does help.  He does not wish to see orthopedics.    BMI is There is no height or weight on file to calculate BMI., he has not been working on diet and exercise.  Trulicity and Ozempic are not covered by insurance, he  is trying to eat less carbs, has not been exercising as much. He has Phentermine but rarely uses.  Wt Readings from Last 3  Encounters:  07/02/23 253 lb 6.4 oz (114.9 kg)  03/26/23 254 lb 6.4 oz (115.4 kg)  12/18/22 255 lb 9.6 oz (115.9 kg)    His blood pressure has not been controlled at home, today their BP is    BP Readings from Last 3 Encounters:  07/02/23 106/64  03/26/23 120/74  12/18/22 138/80  He does not workout. He denies any cardiac symptoms, chest pains, palpitations, shortness of breath, dizziness or lower extremity edema.      He is on cholesterol medication , Rosuvastatin 10 mg QD and fenofibrate 200 mg QD, and denies myalgias.   His cholesterol is at goal of less than 70. The cholesterol last visit was:   Lab Results  Component Value Date   CHOL 166 07/02/2023   HDL 45 07/02/2023   LDLCALC 94 07/02/2023   TRIG 178 (H) 07/02/2023   CHOLHDL 3.7 07/02/2023    He has not been working on diet and exercise for DMII - he has been reducing pasta and potatoes.  Has started drinking sweet tea- is trying to cut back  Normal kidney function Hyperlipidemia With neuropathy Has not been checking his blood sugars- does have supplies Insurance did not cover trulicity or ozempic He is taking Basaglar 26 units daily every AM. He has not been checking his blood sugars He is also taking Meformin XR 500mg  two tablets twice a day.  He is behind 1 year on Eye exam He will occasionally get itch in feet and fingers and denies polydipsia, polyuria, visual disturbances, vomiting and weight loss.     Last A1C in the office was:  Lab Results  Component Value Date   HGBA1C 9.0 (H) 07/02/2023    He is in stage 2 CKD and tries to drink increased fluid throughout the day Lab Results  Component Value Date   EGFR 102 07/02/2023     Patient is on Vitamin D supplement for deficiency and taking 4,000IU daily.   Lab Results  Component Value Date   VD25OH 41 03/26/2023       Current Medications:   Current Outpatient Medications (Endocrine & Metabolic):    Insulin Glargine (BASAGLAR KWIKPEN) 100 UNIT/ML,  Inject 24 Units into the skin daily.   metFORMIN (GLUCOPHAGE-XR) 500 MG 24 hr tablet, TAKE 2 TABLETS TWICE A DAY FOR DIABETES   Current Outpatient Medications (Cardiovascular):    atorvastatin (LIPITOR) 40 MG tablet, Take 1 tablet (40 mg total) by mouth daily.   benazepril (LOTENSIN) 20 MG tablet, TAKE 1 TABLET BY MOUTH EVERY DAY FOR BLOOD PRESSURE ANDDIABETIC KIDNEY PROTECTION   bisoprolol-hydrochlorothiazide (ZIAC) 5-6.25 MG tablet, Take  1 tablet  Daily   for BP                                                                      /  TAKE                                  BY                                      MOUTH   fenofibrate micronized (LOFIBRA) 200 MG capsule, Take  1 capsule  Daily  for Triglycerides  (Blood Fats)                                                            /                                   TAKE                                              BY                                         MOUTH     Current Outpatient Medications (Analgesics):    allopurinol (ZYLOPRIM) 300 MG tablet, TAKE 1 TABLET BY MOUTH EVERY DAY FOR GOUT PREVENTION   aspirin 81 MG tablet, Take 81 mg by mouth daily.   ibuprofen (ADVIL) 800 MG tablet, Take 800 mg by mouth 3 (three) times daily.   Current Outpatient Medications (Hematological):    vitamin B-12 (CYANOCOBALAMIN) 100 MCG tablet, Take 100 mcg by mouth daily.   Current Outpatient Medications (Other):    Blood Glucose Monitoring Suppl (FREESTYLE LITE) DEVI, 1 device to check sugars, patient on insulin   Blood Glucose Monitoring Suppl (ONETOUCH VERIO REFLECT) w/Device KIT, USE TO TEST BLODD SUGAR LEVELS DAILY   CHOLECALCIFEROL PO, Take 15,000 Units by mouth daily.   Continuous Blood Gluc Receiver (FREESTYLE LIBRE 14 DAY READER) DEVI, 1 Device by Does not apply route every 14 (fourteen) days.   Continuous Blood Gluc Sensor (FREESTYLE LIBRE 14 DAY SENSOR) MISC, 1 Units by Does not apply route every 14  (fourteen) days.   glucose blood (FREESTYLE LITE) test strip, Test sugar 3 x daily due to hyperglycemia and insulin use. E11.22   Insulin Pen Needle (ULTRA FLO INSULIN PEN NEEDLES) 31G X 5 MM MISC, Use to inject insulin as directed.   Lancets MISC, Test blood sugar once daily   Magnesium 500 MG TABS, Take by mouth daily.   Multiple Vitamin (MULTIVITAMIN WITH MINERALS) TABS tablet, Take 1 tablet by mouth daily.   phentermine (ADIPEX-P) 37.5 MG tablet, TAKE 1 TABLET EVERY MORNING FOR DIETING & WEIGHT LOSS  Current Facility-Administered Medications (Other):    0.9 %  sodium chloride infusion  Allergies:  Allergies  Allergen Reactions   Ppd [Tuberculin Purified Protein Derivative]     + PPD 2010   Welchol [Colesevelam Hcl]     Chest pain     Medical History:  Past Medical History:  Diagnosis Date  Carpal tunnel syndrome on both sides    Diabetes mellitus type 2, insulin dependent (HCC)    Diabetic ulcer of left foot associated with diabetes mellitus due to underlying condition, limited to breakdown of skin (HCC) 03/16/2019   Fatty liver disease, nonalcoholic    GERD (gastroesophageal reflux disease)    Gout    Hyperlipidemia    Hypertension    Kidney stone    Testosterone deficiency 01/25/2014   Vitamin D deficiency     Family history- Reviewed and unchanged Social history- Reviewed and unchanged   Review of Systems:  Review of Systems  Constitutional:  Negative for chills, fever and weight loss.  HENT:  Negative for congestion and hearing loss.   Eyes:  Negative for blurred vision and double vision.  Respiratory:  Negative for cough and shortness of breath.   Cardiovascular:  Negative for chest pain, palpitations, orthopnea and leg swelling.  Gastrointestinal:  Negative for abdominal pain, constipation, diarrhea, heartburn, nausea and vomiting.  Musculoskeletal:  Negative for falls, joint pain and myalgias.  Skin:  Negative for rash.  Neurological:  Negative for  dizziness, tingling, tremors, loss of consciousness and headaches.  Psychiatric/Behavioral:  Negative for depression, memory loss and suicidal ideas.       Physical Exam: There were no vitals taken for this visit. Wt Readings from Last 3 Encounters:  07/02/23 253 lb 6.4 oz (114.9 kg)  03/26/23 254 lb 6.4 oz (115.4 kg)  12/18/22 255 lb 9.6 oz (115.9 kg)   General Appearance: Well nourished, in no apparent distress. Eyes: PERRLA, EOMs, conjunctiva no swelling or erythema ENT/Mouth: Ext aud canals clear, TMs without erythema, bulging. Hearing normal.  Neck: Supple, thyroid normal.  Respiratory: Respiratory effort normal, BS equal bilaterally without rales, rhonchi, wheezing or stridor.  Cardio: RRR with no MRGs. Brisk peripheral pulses without edema.  Abdomen: Soft, + BS.  Non tender, no guarding, rebound, hernias, masses. Lymphatics: Non tender without lymphadenopathy.  Musculoskeletal: Full ROM, 5/5 strength, Normal gait. Decreased ROM and pain with left and right shoulder when reaching overhead Skin: Warm, dry. No skin lesions Neuro: Cranial nerves intact. No cerebellar symptoms.  Psych: Awake and oriented X 3, normal affect, Insight and Judgment appropriate.     Revonda Humphrey ANP-C  [facility]  10/07/2023

## 2023-10-08 ENCOUNTER — Encounter: Payer: Self-pay | Admitting: Nurse Practitioner

## 2023-10-08 ENCOUNTER — Ambulatory Visit (INDEPENDENT_AMBULATORY_CARE_PROVIDER_SITE_OTHER): Payer: Self-pay | Admitting: Nurse Practitioner

## 2023-10-08 VITALS — BP 122/72 | HR 76 | Temp 97.9°F | Ht 73.0 in | Wt 255.2 lb

## 2023-10-08 DIAGNOSIS — E1165 Type 2 diabetes mellitus with hyperglycemia: Secondary | ICD-10-CM

## 2023-10-08 DIAGNOSIS — E1169 Type 2 diabetes mellitus with other specified complication: Secondary | ICD-10-CM

## 2023-10-08 DIAGNOSIS — I700 Atherosclerosis of aorta: Secondary | ICD-10-CM

## 2023-10-08 DIAGNOSIS — E785 Hyperlipidemia, unspecified: Secondary | ICD-10-CM

## 2023-10-08 DIAGNOSIS — E559 Vitamin D deficiency, unspecified: Secondary | ICD-10-CM

## 2023-10-08 DIAGNOSIS — E1122 Type 2 diabetes mellitus with diabetic chronic kidney disease: Secondary | ICD-10-CM

## 2023-10-08 DIAGNOSIS — E1142 Type 2 diabetes mellitus with diabetic polyneuropathy: Secondary | ICD-10-CM

## 2023-10-08 DIAGNOSIS — E669 Obesity, unspecified: Secondary | ICD-10-CM

## 2023-10-08 DIAGNOSIS — Z79899 Other long term (current) drug therapy: Secondary | ICD-10-CM

## 2023-10-08 DIAGNOSIS — N181 Chronic kidney disease, stage 1: Secondary | ICD-10-CM

## 2023-10-08 DIAGNOSIS — I10 Essential (primary) hypertension: Secondary | ICD-10-CM

## 2023-10-08 DIAGNOSIS — Z794 Long term (current) use of insulin: Secondary | ICD-10-CM

## 2023-10-08 NOTE — Patient Instructions (Signed)

## 2023-10-09 LAB — COMPLETE METABOLIC PANEL WITHOUT GFR
AG Ratio: 1.2 (calc) (ref 1.0–2.5)
ALT: 25 U/L (ref 9–46)
AST: 19 U/L (ref 10–35)
Albumin: 4.1 g/dL (ref 3.6–5.1)
Alkaline phosphatase (APISO): 114 U/L (ref 35–144)
BUN: 11 mg/dL (ref 7–25)
CO2: 26 mmol/L (ref 20–32)
Calcium: 9.6 mg/dL (ref 8.6–10.3)
Chloride: 103 mmol/L (ref 98–110)
Creat: 0.78 mg/dL (ref 0.70–1.30)
Globulin: 3.4 g/dL (ref 1.9–3.7)
Glucose, Bld: 151 mg/dL — ABNORMAL HIGH (ref 65–99)
Potassium: 4.3 mmol/L (ref 3.5–5.3)
Sodium: 137 mmol/L (ref 135–146)
Total Bilirubin: 0.3 mg/dL (ref 0.2–1.2)
Total Protein: 7.5 g/dL (ref 6.1–8.1)
eGFR: 104 mL/min/1.73m2

## 2023-10-09 LAB — CBC WITH DIFFERENTIAL/PLATELET
Absolute Lymphocytes: 2696 {cells}/uL (ref 850–3900)
Absolute Monocytes: 473 {cells}/uL (ref 200–950)
Basophils Absolute: 63 {cells}/uL (ref 0–200)
Basophils Relative: 1 %
Eosinophils Absolute: 302 {cells}/uL (ref 15–500)
Eosinophils Relative: 4.8 %
HCT: 41.6 % (ref 38.5–50.0)
Hemoglobin: 13.7 g/dL (ref 13.2–17.1)
MCH: 30.3 pg (ref 27.0–33.0)
MCHC: 32.9 g/dL (ref 32.0–36.0)
MCV: 92 fL (ref 80.0–100.0)
MPV: 10.7 fL (ref 7.5–12.5)
Monocytes Relative: 7.5 %
Neutro Abs: 2766 {cells}/uL (ref 1500–7800)
Neutrophils Relative %: 43.9 %
Platelets: 247 Thousand/uL (ref 140–400)
RBC: 4.52 Million/uL (ref 4.20–5.80)
RDW: 12.2 % (ref 11.0–15.0)
Total Lymphocyte: 42.8 %
WBC: 6.3 Thousand/uL (ref 3.8–10.8)

## 2023-10-09 LAB — HEMOGLOBIN A1C: Hgb A1c MFr Bld: 10.1 %{Hb} — ABNORMAL HIGH

## 2023-10-09 LAB — LIPID PANEL
Cholesterol: 104 mg/dL (ref <200)
HDL: 45 mg/dL (ref >=40)
LDL Cholesterol (Calc): 41 mg/dL
Non-HDL Cholesterol (Calc): 59 mg/dL (ref <130)
Total CHOL/HDL Ratio: 2.3 (calc) (ref <5.0)
Triglycerides: 92 mg/dL (ref <150)

## 2023-11-11 ENCOUNTER — Other Ambulatory Visit: Payer: Self-pay | Admitting: Nurse Practitioner

## 2023-11-11 ENCOUNTER — Other Ambulatory Visit: Payer: Self-pay | Admitting: Internal Medicine

## 2023-11-11 DIAGNOSIS — E1169 Type 2 diabetes mellitus with other specified complication: Secondary | ICD-10-CM

## 2023-11-11 DIAGNOSIS — N182 Chronic kidney disease, stage 2 (mild): Secondary | ICD-10-CM

## 2023-12-17 ENCOUNTER — Other Ambulatory Visit: Payer: Self-pay | Admitting: Internal Medicine

## 2024-02-24 ENCOUNTER — Ambulatory Visit: Payer: PRIVATE HEALTH INSURANCE | Admitting: Student in an Organized Health Care Education/Training Program

## 2024-02-24 ENCOUNTER — Encounter: Payer: Self-pay | Admitting: Student in an Organized Health Care Education/Training Program

## 2024-02-24 VITALS — BP 118/60 | HR 98

## 2024-02-24 DIAGNOSIS — E785 Hyperlipidemia, unspecified: Secondary | ICD-10-CM

## 2024-02-24 DIAGNOSIS — N182 Chronic kidney disease, stage 2 (mild): Secondary | ICD-10-CM

## 2024-02-24 DIAGNOSIS — E1122 Type 2 diabetes mellitus with diabetic chronic kidney disease: Secondary | ICD-10-CM

## 2024-02-24 DIAGNOSIS — M1000 Idiopathic gout, unspecified site: Secondary | ICD-10-CM | POA: Diagnosis not present

## 2024-02-24 DIAGNOSIS — N181 Chronic kidney disease, stage 1: Secondary | ICD-10-CM

## 2024-02-24 DIAGNOSIS — I10 Essential (primary) hypertension: Secondary | ICD-10-CM | POA: Diagnosis not present

## 2024-02-24 DIAGNOSIS — Z794 Long term (current) use of insulin: Secondary | ICD-10-CM | POA: Diagnosis not present

## 2024-02-24 DIAGNOSIS — E1169 Type 2 diabetes mellitus with other specified complication: Secondary | ICD-10-CM

## 2024-02-24 DIAGNOSIS — E119 Type 2 diabetes mellitus without complications: Secondary | ICD-10-CM

## 2024-02-24 LAB — POCT GLYCOSYLATED HEMOGLOBIN (HGB A1C): Hemoglobin A1C: 11.9 % — AB (ref 4.0–5.6)

## 2024-02-24 MED ORDER — METFORMIN HCL ER 500 MG PO TB24
ORAL_TABLET | ORAL | 3 refills | Status: AC
Start: 2024-02-24 — End: ?

## 2024-02-24 MED ORDER — OZEMPIC (0.25 OR 0.5 MG/DOSE) 2 MG/3ML ~~LOC~~ SOPN: 0.2500 mg | PEN_INJECTOR | SUBCUTANEOUS | 2 refills | Status: DC

## 2024-02-24 MED ORDER — ATORVASTATIN CALCIUM 40 MG PO TABS
40.0000 mg | ORAL_TABLET | Freq: Every day | ORAL | 3 refills | Status: DC
Start: 2024-02-24 — End: 2024-05-25

## 2024-02-24 MED ORDER — BASAGLAR KWIKPEN 100 UNIT/ML ~~LOC~~ SOPN
32.0000 [IU] | PEN_INJECTOR | Freq: Every day | SUBCUTANEOUS | 1 refills | Status: DC
Start: 2024-02-24 — End: 2024-05-25

## 2024-02-24 MED ORDER — EMPAGLIFLOZIN 10 MG PO TABS: 10.0000 mg | ORAL_TABLET | Freq: Every day | ORAL | 1 refills | Status: DC

## 2024-02-24 MED ORDER — AMLODIPINE BESYLATE-VALSARTAN 5-160 MG PO TABS: 1.0000 | ORAL_TABLET | Freq: Every day | ORAL | 3 refills | Status: DC

## 2024-02-24 MED ORDER — ALLOPURINOL 300 MG PO TABS
ORAL_TABLET | ORAL | 3 refills | Status: AC
Start: 2024-02-24 — End: ?

## 2024-02-24 NOTE — Assessment & Plan Note (Signed)
 Blood pressure well-controlled today.  No history of ischemic heart disease so I do not think we need a beta-blocker component going forward.  I decided to switch blood pressure medications to a Gregson of amlodipine and valsartan to reduce pill burden.

## 2024-02-24 NOTE — Assessment & Plan Note (Signed)
 Chronic and uncontrolled.  We decided to start with a new regimen.  Will continue metformin and long-acting insulin 32 units daily.  Will add Jardiance 10 mg daily and Ozempic.  Will start prior authorization process.  I think the Ozempic and Jardiance will be medically necessary to manage his diabetes which has not been well-managed with insulin and metformin.  He has some mild complications including proteinuria, atherosclerosis, and mild neuropathy.

## 2024-02-24 NOTE — Progress Notes (Signed)
 New Patient Office Visit  Subjective    Patient ID: Arthur Hudson, male    DOB: May 15, 1966  Age: 58 y.o. MRN: [id number]  CC:   Chief Complaint  Patient presents with   Establish Care    Blood sugars have been off as well.   Patient was on trulicity but has not been able to receive this medication due to needing a prior authorization.     HPI  Arthur Hudson presents to establish care  58 year old person here to establish care for management of diabetes and hypertension.  His previous primary care physician passed away last fall.  He has had some significant issues getting medications approved through insurance, and so has had some interruptions in his medications.  For example he was previously using Trulicity but then had to discontinue.  Right now has been using metformin, long-acting insulin, and a short acting insulin with a sliding scale based on fingerstick glucose.  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  He works mostly in the afternoons and evening hours.  He has about 2 consistent meals per day.  Reports having diabetes for about 25 years, denies any history of DKA.  Denies knowing about any complication of his diabetes though does describe some sensations that sound like neuropathy in his hands.  He has osteoarthritis in his right shoulder and legs.  He is married, lives in the [place] area with his wife.  Denies any chest pain, shortness of breath or recent illness.  No recent hospitalizations.  Only surgery was a gallbladder removal.   Outpatient Encounter Medications as of 02/24/2024  Medication Sig   amLODipine-valsartan (EXFORGE) 5-160 MG tablet Take 1 tablet by mouth daily.   empagliflozin (JARDIANCE) 10 MG TABS tablet Take 1 tablet (10 mg total) by mouth daily before breakfast.   Semaglutide,0.25 or 0.5MG /DOS, (OZEMPIC, 0.25 OR 0.5 MG/DOSE,) 2 MG/3ML SOPN Inject 0.25 mg into the skin once a week.   [DISCONTINUED] allopurinol (ZYLOPRIM) 300  MG tablet TAKE 1 TABLET BY MOUTH EVERY DAY FOR GOUT PREVENTION   [DISCONTINUED] aspirin 81 MG tablet Take 81 mg by mouth daily.   [DISCONTINUED] atorvastatin (LIPITOR) 40 MG tablet Take 1 tablet (40 mg total) by mouth daily.   [DISCONTINUED] benazepril (LOTENSIN) 20 MG tablet TAKE 1 TABLET BY MOUTH EVERY DAY FOR BLOOD PRESSURE ANDDIABETIC KIDNEY PROTECTION   [DISCONTINUED] bisoprolol-hydrochlorothiazide (ZIAC) 5-6.25 MG tablet TAKE 1 TABLET BY MOUTH EVERY DAY FOR BLOOD PRESSURE   [DISCONTINUED] Blood Glucose Monitoring Suppl (FREESTYLE LITE) DEVI 1 device to check sugars, patient on insulin   [DISCONTINUED] Blood Glucose Monitoring Suppl (ONETOUCH VERIO REFLECT) w/Device KIT USE TO TEST BLODD SUGAR LEVELS DAILY   [DISCONTINUED] CHOLECALCIFEROL PO Take 15,000 Units by mouth daily.   [DISCONTINUED] Continuous Blood Gluc Receiver (FREESTYLE LIBRE 14 DAY READER) DEVI 1 Device by Does not apply route every 14 (fourteen) days.   [DISCONTINUED] Continuous Blood Gluc Sensor (FREESTYLE LIBRE 14 DAY SENSOR) MISC 1 Units by Does not apply route every 14 (fourteen) days.   [DISCONTINUED] fenofibrate micronized (LOFIBRA) 200 MG capsule TAKE 1 CAPSULE BY MOUTH EVERY DAY FOR TRIGLUCERIDES   [DISCONTINUED] glucose blood (FREESTYLE LITE) test strip Test sugar 3 x daily due to hyperglycemia and insulin use. E11.22   [DISCONTINUED] ibuprofen (ADVIL) 800 MG tablet Take 800 mg by mouth 3 (three) times daily.   [DISCONTINUED] Insulin Glargine (BASAGLAR KWIKPEN) 100 UNIT/ML INJECT 24 UNITS INTO THE SKIN DAILY.   [DISCONTINUED] Insulin Pen  Needle (ULTRA FLO INSULIN PEN NEEDLES) 31G X 5 MM MISC Use to inject insulin as directed.   [DISCONTINUED] Lancets MISC Test blood sugar once daily   [DISCONTINUED] Magnesium 500 MG TABS Take by mouth daily.   [DISCONTINUED] metFORMIN (GLUCOPHAGE-XR) 500 MG 24 hr tablet TAKE 2 TABLETS TWICE A DAY FOR DIABETES   [DISCONTINUED] Multiple Vitamin (MULTIVITAMIN WITH MINERALS) TABS tablet  Take 1 tablet by mouth daily.   [DISCONTINUED] phentermine (ADIPEX-P) 37.5 MG tablet TAKE 1 TABLET EVERY MORNING FOR DIETING & WEIGHT LOSS   [DISCONTINUED] vitamin B-12 (CYANOCOBALAMIN) 100 MCG tablet Take 100 mcg by mouth daily.   allopurinol (ZYLOPRIM) 300 MG tablet TAKE 1 TABLET BY MOUTH EVERY DAY FOR GOUT PREVENTION   atorvastatin (LIPITOR) 40 MG tablet Take 1 tablet (40 mg total) by mouth daily.   Insulin Glargine (BASAGLAR KWIKPEN) 100 UNIT/ML Inject 32 Units into the skin daily.   metFORMIN (GLUCOPHAGE-XR) 500 MG 24 hr tablet TAKE 2 TABLETS TWICE A DAY FOR DIABETES   [DISCONTINUED] 0.9 %  sodium chloride infusion    No facility-administered encounter medications on file as of 02/24/2024.    Past Medical History:  Diagnosis Date   Carpal tunnel syndrome on both sides    Diabetes mellitus type 2, insulin dependent (HCC)    Diabetic ulcer of left foot associated with diabetes mellitus due to underlying condition, limited to breakdown of skin (HCC) 03/16/2019   Fatty liver disease, nonalcoholic    GERD (gastroesophageal reflux disease)    Gout    Hyperlipidemia    Hypertension    Kidney stone    Testosterone deficiency 01/25/2014   Vitamin D deficiency     Past Surgical History:  Procedure Laterality Date   CYSTOSCOPY WITH RETROGRADE PYELOGRAM, URETEROSCOPY AND STENT PLACEMENT Right 02/01/2015   Procedure: CYSTOSCOPY WITH RETROGRADE PYELOGRAM, URETEROSCOPY, DIGITAL URETEROSCOPY with STONE BASKETRY  AND STENT PLACEMENT;  Surgeon: Osborn Blaze, MD;  Location: [facility];  Service: Urology;  Laterality: Right;   HOLMIUM LASER APPLICATION Right 02/01/2015   Procedure: HOLMIUM LASER APPLICATION;  Surgeon: Osborn Blaze, MD;  Location[facility];  Service: Urology;  Laterality: Right;    Family History  Problem Relation Age of Onset   Hypertension Mother    Diabetes Mother    Asthma Mother    Diabetes Father    Hypertension Father    Stroke  Father 12       smoker   Alzheimer's disease Father    Hypertension Brother    Hyperlipidemia Brother    Diabetes Brother    Esophageal cancer Paternal Uncle    Colon cancer Neg Hx    Colon polyps Neg Hx    Stomach cancer Neg Hx    Rectal cancer Neg Hx     Social History   Socioeconomic History   Marital status: Married    Spouse name: Not on file   Number of children: Not on file   Years of education: Not on file   Highest education level: Not on file  Occupational History   Not on file  Tobacco Use   Smoking status: Former    Types: Cigars    Quit date: 04/23/2018    Years since quitting: 5.8   Smokeless tobacco: Former    Types: Snuff   Tobacco comments:    Rarely states        Started dip as a teen, Stopped dip 5 years ago 2018  Vaping Use   Vaping status: Never  Used  Substance and Sexual Activity   Alcohol use: Not Currently    Alcohol/week: 4.0 standard drinks of alcohol    Types: 4 Cans of beer per week    Comment: social   Drug use: Never   Sexual activity: Yes    Partners: Female    Birth control/protection: None  Other Topics Concern   Not on file  Social History Narrative   Not on file   Social Drivers of Health   Financial Resource Strain: Not on file  Food Insecurity: Not on file  Transportation Needs: Not on file  Physical Activity: Not on file  Stress: Not on file  Social Connections: Not on file  Intimate Partner Violence: Not on file        Objective    BP 118/60   Pulse 98   SpO2 (!) 72%   Physical Exam  Gen: Well-appearing man Eyes: Normal Ears: Normal bilaterally Neck: Normal thyroid, there is a 3 cm soft tissue mass on his upper submandibular neck previously identified as a lipoma on ultrasound in 2019 Heart: Regular, no murmur Lungs: Unlabored, clear to auscultation Abd: Soft, nontender Ext: Warm, no edema Neuro: Alert, conversational, full strength upper and lower extremities      Assessment & Plan:   Problem  List Items Addressed This Visit       High   Hypertension (Chronic)   Blood pressure well-controlled today.  No history of ischemic heart disease so I do not think we need a beta-blocker component going forward.  I decided to switch blood pressure medications to a Arkin of amlodipine and valsartan to reduce pill burden.      Relevant Medications   amLODipine-valsartan (EXFORGE) 5-160 MG tablet   atorvastatin (LIPITOR) 40 MG tablet   Diabetes mellitus without complication (HCC) (Chronic)   Chronic and uncontrolled.  We decided to start with a new regimen.  Will continue metformin and long-acting insulin 32 units daily.  Will add Jardiance 10 mg daily and Ozempic.  Will start prior authorization process.  I think the Ozempic and Jardiance will be medically necessary to manage his diabetes which has not been well-managed with insulin and metformin.  He has some mild complications including proteinuria, atherosclerosis, and mild neuropathy.      Relevant Medications   Insulin Glargine (BASAGLAR KWIKPEN) 100 UNIT/ML   empagliflozin (JARDIANCE) 10 MG TABS tablet   Semaglutide,0.25 or 0.5MG /DOS, (OZEMPIC, 0.25 OR 0.5 MG/DOSE,) 2 MG/3ML SOPN   amLODipine-valsartan (EXFORGE) 5-160 MG tablet   atorvastatin (LIPITOR) 40 MG tablet   metFORMIN (GLUCOPHAGE-XR) 500 MG 24 hr tablet     Medium    Hyperlipidemia associated with type 2 diabetes mellitus (HCC) (Chronic)   Relevant Medications   Insulin Glargine (BASAGLAR KWIKPEN) 100 UNIT/ML   empagliflozin (JARDIANCE) 10 MG TABS tablet   Semaglutide,0.25 or 0.5MG /DOS, (OZEMPIC, 0.25 OR 0.5 MG/DOSE,) 2 MG/3ML SOPN   amLODipine-valsartan (EXFORGE) 5-160 MG tablet   atorvastatin (LIPITOR) 40 MG tablet   metFORMIN (GLUCOPHAGE-XR) 500 MG 24 hr tablet     Low   Gout (Chronic)   Relevant Medications   allopurinol (ZYLOPRIM) 300 MG tablet   Other Visit Diagnoses       Type 2 diabetes mellitus with stage 1 chronic kidney disease, with long-term current  use of insulin (HCC)    -  Primary   Relevant Medications   Insulin Glargine (BASAGLAR KWIKPEN) 100 UNIT/ML   empagliflozin (JARDIANCE) 10 MG TABS tablet   Semaglutide,0.25 or 0.5MG /DOS, (OZEMPIC, 0.25  OR 0.5 MG/DOSE,) 2 MG/3ML SOPN   amLODipine-valsartan (EXFORGE) 5-160 MG tablet   atorvastatin (LIPITOR) 40 MG tablet   metFORMIN (GLUCOPHAGE-XR) 500 MG 24 hr tablet     Type 2 diabetes mellitus with stage 2 chronic kidney disease, with long-term current use of insulin (HCC)       Relevant Medications   Insulin Glargine (BASAGLAR KWIKPEN) 100 UNIT/ML   empagliflozin (JARDIANCE) 10 MG TABS tablet   Semaglutide,0.25 or 0.5MG /DOS, (OZEMPIC, 0.25 OR 0.5 MG/DOSE,) 2 MG/3ML SOPN   amLODipine-valsartan (EXFORGE) 5-160 MG tablet   atorvastatin (LIPITOR) 40 MG tablet   metFORMIN (GLUCOPHAGE-XR) 500 MG 24 hr tablet   Other Relevant Orders   POCT glycosylated hemoglobin (Hb A1C) (Completed)       Return in about 3 months (around 05/25/2024) for Diabetes management.   Ether Hercules, MD

## 2024-02-25 ENCOUNTER — Telehealth: Payer: Self-pay

## 2024-02-25 ENCOUNTER — Other Ambulatory Visit (HOSPITAL_COMMUNITY): Payer: Self-pay

## 2024-02-25 NOTE — Telephone Encounter (Signed)
 Clinical questions have been answered and PA submitted. PA currently Pending.

## 2024-02-25 NOTE — Telephone Encounter (Signed)
 Pharmacy Patient Advocate Encounter   Received notification from Physician's Office that prior authorization for Ozempic (0.25 or 0.5 MG/DOSE) 2MG /3ML pen-injectors is required/requested.   Insurance verification completed.   The patient is insured through [facility] .   Per test claim: PA required; PA started via CoverMyMeds. KEY [id number] . Waiting for clinical questions to populate.

## 2024-02-25 NOTE — Telephone Encounter (Signed)
-----   Message from [employer] sent at 02/24/2024  3:57 PM EDT ----- Please initiate prior authorization for ozempic for treatment of diabetes not controlled with metformin. Thank you.

## 2024-02-25 NOTE — Telephone Encounter (Signed)
 Pharmacy Patient Advocate Encounter   Received notification from Physician's Office that prior authorization for OZEMPIC is required/requested.   Insurance verification completed.   The patient is insured through [facility]  .   Per test claim: Refill too soon. PA is not needed at this time. Medication was filled 02/24/24. Next eligible fill date is 03/17/24.   Per test claim: PA expires on 03/27/24

## 2024-02-28 DIAGNOSIS — I959 Hypotension, unspecified: Secondary | ICD-10-CM | POA: Diagnosis present

## 2024-02-28 NOTE — Telephone Encounter (Signed)
 Pharmacy Patient Advocate Encounter  Received notification from  [facility]   that Prior Authorization for OZEMPIC  has been APPROVED from 02/28/2024 to 02/27/2025   PA #/Case ID/Reference #: [id number] Katheran Palms

## 2024-02-29 ENCOUNTER — Encounter: Payer: Self-pay | Admitting: Emergency Medicine

## 2024-02-29 ENCOUNTER — Other Ambulatory Visit: Payer: Self-pay

## 2024-02-29 ENCOUNTER — Emergency Department
Admission: EM | Admit: 2024-02-29 | Discharge: 2024-02-29 | Disposition: A | Discharge status: Home / Self Care | Attending: Emergency Medicine | Admitting: Emergency Medicine

## 2024-02-29 DIAGNOSIS — I959 Hypotension, unspecified: Secondary | ICD-10-CM

## 2024-02-29 NOTE — Discharge Instructions (Signed)
 Although your blood pressure was a little bit low at home, it has been fairly consistent and appropriate here in the emergency department.  You are otherwise asymptomatic.  We talked about doing more extensive workup including lab work, but that does not appear to be necessary at this time.  You probably overexerted yourself a bit today and need to spend a couple of days taking your medicine as prescribed but also rehydrating and letting your body adjust to the new blood pressure medicine.  We recommend you follow-up with your primary care provider next week and let them know how your blood pressure has been doing and how you feel.  Additional adjustments may be necessary.    Return to the emergency department if you develop new or worsening symptoms that concern you.

## 2024-02-29 NOTE — ED Notes (Signed)
 Pt recently got a new PCP who has changed his bp and diabetic medications. Pt denies any hypotension symptoms at this time.

## 2024-02-29 NOTE — ED Triage Notes (Signed)
 Pt presents to the ED via POV with complaints of Hypotension - recent change in BP medications on Monday. Pt is now taking Exforge  (5-160mg ). PTA his BP was 90s/70s. Pt has been eating and drinking normally. A&Ox4 at this time. Denies dizziness, vision changes, CP or SOB.

## 2024-02-29 NOTE — ED Provider Notes (Signed)
 [facility] Provider Note    Event Date/Time   First MD Initiated Contact with Patient 02/29/24 [phone]     (approximate)   History   Hypotension   HPI Arthur Hudson is a 58 y.o. male who presents with concerns over low blood pressure.  Actually, when I asked him, he states "SHE is concerned, " pointing to his wife who is at bedside.  He is an insulin -dependent diabetic and has a history of hypertension as well.  He had an appointment about 5 days ago with his PCP and his blood pressure medicine was switched around and he is now taking a combination pill of amlodipine  and valsartan  (5-160 mg).  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  He was try to stay hydrated but he started to feel woozy and would need to take a break.  Eventually he stopped and his wife saw him sitting on the porch and not feeling well in general, just a little bit lightheaded.  He never passed out or fell like he was going to and he had no chest pain or shortness of breath.  No nausea or vomiting.  She checked his blood pressure and it was low, as low as 80 systolic at 1 point.  His heart rate was a little bit elevated at around 99.  He said he feels fine now and has been drinking fluid but they wanted to make sure everything was okay.  His blood glucose has been well-controlled recently after changing around to those medications as well over the last week it was elevated as high as the 400-500 range, but tonight it is consistently around 100-120.     Physical Exam   Triage Vital Signs: ED Triage Vitals  Encounter Vitals Group     BP 02/29/24 0005 128/72     Systolic BP Percentile --      Diastolic BP Percentile --      Pulse Rate 02/29/24 0005 88     Resp 02/29/24 0005 18     Temp 02/29/24 0005 98.6 F (37 C)     Temp Source 02/29/24 0005 Oral     SpO2 02/29/24 0005 100 %     Weight 02/29/24 0006 109.8 kg (242 lb)     Height  02/29/24 0006 1.854 m (6\' 1" )     Head Circumference --      Peak Flow --      Pain Score 02/29/24 0006 0     Pain Loc --      Pain Education --      Exclude from Growth Chart --     Most recent vital signs: Vitals:   02/29/24 0100 02/29/24 0130  BP: 99/67 103/65  Pulse: 88 84  Resp:  18  Temp:    SpO2: 99% 98%    General: Awake, no distress.  Well-appearing in general. CV:  Good peripheral perfusion.  Regular rate and rhythm. Resp:  Normal effort. Speaking easily and comfortably, no accessory muscle usage nor intercostal retractions.   Abd:  No distention.  Other:  Patient was able to go from a semirecumbent position to standing quickly and with no dizziness or lightheadedness.  He is steady and stable on his feet, ambulating without difficulty, and has no focal neurological deficits.   ED Results / Procedures / Treatments   Labs (all labs ordered are listed, but only abnormal results are displayed) Labs Reviewed - No  data to display    PROCEDURES:  Critical Care performed: No  Procedures    IMPRESSION / MDM / ASSESSMENT AND PLAN / ED COURSE  I reviewed the triage vital signs and the nursing notes.                              Differential diagnosis includes, but is not limited to, volume depletion, medication side effect, sepsis.  Patient's presentation is most consistent with acute, uncomplicated illness.      Patient's physical exam, history, and vital signs in the emergency department are all reassuring.  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  He is likely also still adjusting to his new blood pressure medicine which is doing a good job of controlling his hypertension.  Given that he has asymptomatic and his blood pressure has been reassuring in the ED and he  is tolerating oral fluids without difficulty, he and his wife are comfortable with the plan for discharge and outpatient follow-up.  I gave strict return precautions.  The patient's medical screening exam is reassuring with no indication of an emergent medical condition requiring hospitalization or additional evaluation at this point.  The patient is safe and appropriate for discharge and outpatient follow up.       FINAL CLINICAL IMPRESSION(S) / ED DIAGNOSES   Final diagnoses:  Hypotension, unspecified hypotension type     Rx / DC Orders   ED Discharge Orders     None        Note:  This document was prepared using Dragon voice recognition software and may include unintentional dictation errors.   Lynnda Sas, MD 02/29/24 [phone]

## 2024-03-24 ENCOUNTER — Telehealth: Payer: Self-pay

## 2024-03-24 NOTE — Telephone Encounter (Unsigned)
 Copied from CRM [id number]. Topic: Clinical - Prescription Issue >> Mar 24, 2024  3:34 PM Kita Perish H wrote: Reason for CRM: Patient is calling due to his Semaglutide ,0.25 or 0.5MG /DOS, (OZEMPIC , 0.25 OR 0.5 MG/DOSE,) 2 MG/3ML SOPN and empagliflozin  (JARDIANCE ) 10 MG TABS tablet states that with his insurance out of pocket it's between [phone] dollars. Patient wants to know is there a way office can assist or something different he can use.  Aarnav [phone]

## 2024-03-25 ENCOUNTER — Encounter: Payer: Self-pay | Admitting: Nurse Practitioner

## 2024-03-25 NOTE — Telephone Encounter (Signed)
 Patient out of pocket is over $900 foOzempic , is there an alternative that would be more cost effective.

## 2024-03-25 NOTE — Telephone Encounter (Signed)
 Hi Tammy,  Can you help us  find a preferred GLP1 agonist that this person can access through his insurance company? Indication is for uncontrolled diabetes despite other medications.   Thank you.

## 2024-03-26 ENCOUNTER — Encounter: Payer: Self-pay | Admitting: Pharmacist

## 2024-03-26 NOTE — Telephone Encounter (Signed)
 Medication Access:  - Screened patient for Medicaid. Income did not qualify.  - Screened for [employer] patient assistance program for Jardiance  but household income was above cut off. - called his health plan - [employer]. Patient has a high deductible plan. His deductible is $7500 / year. Representative could not disclose to me the exact amount patient has left until he reaches his deductible but stated it was about half (so around $3800).  - Ozempic  cost is $926 - I have a coupon card that will lower cost about $100 per 30 days until patient meets deductible.              - There is a generic for Victoza  / liraglutide  that cost about $620 and then $265 with GoodRx Card.             - Checked to see if maybe GLP1/insulin  combos Xultophy or Soliqua  were covered but their cost was similar to Ozempic  - Xultophy $1100 anSoliqua  $830 / month - Jardiance  cost is $400 if he only gets 30 days. With discount card, cost would decreased by $175 per month - patient cost would be around $225/month. - Mailed discount cards for Ozempic  and Jardiance  to patient.  - I agreed that over all GLP1 and / or SGLT2 are better options but could consider increasing Basaglar  dose for better blood glucose control. Patient is thinking about his options and costs.

## 2024-03-26 NOTE — Progress Notes (Signed)
 03/26/2024 Name: Arthur Hudson MRN: [id number] DOB: November 17, 1965  Chief Complaint  Patient presents with   Medication Management    Arthur Hudson is a 58 y.o. year old male who presented for a telephone visit.   They were referred to the pharmacist by their PCP for assistance in managing medication access.    Subjective:  Received request from PCP to assist with GLP1 for patient. It appears that cost of Ozempic  and Jardiance  cost were > $900.   Care Team: Primary Care Provider: Ether Hercules, MD ; Next Scheduled Visit: 05/25/2024  Medication Access/Adherence  Current Pharmacy:  [facility] [phone] - [address] [place] - [address] [place] [address] Phone: [phone] Fax: [phone]   Patient reports affordability concerns with their medications: Yes  Patient reports access/transportation concerns to their pharmacy: No  Patient reports adherence concerns with their medications:  Yes  - due to cost   Diabetes:  Current medications:  Basaglar  32 units daily,  Metformin  ER 500mg  - take 2 tabs twice a day Ozempic  0.25mg  weekly - has not started due to cost Jardiance  10mg  - once a day - has not started due to cost.    Medications tried in the past: Farxiga , Xigduo  - stopped due to cost (took around 2014 or 2015)  Current medication access support: none   Objective:  Lab Results  Component Value Date   HGBA1C 11.9 (A) 02/24/2024    Lab Results  Component Value Date   CREATININE 0.78 10/08/2023   BUN 11 10/08/2023   NA 137 10/08/2023   K 4.3 10/08/2023   CL 103 10/08/2023   CO2 26 10/08/2023    Lab Results  Component Value Date   CHOL 104 10/08/2023   HDL 45 10/08/2023   LDLCALC 41 10/08/2023   TRIG 92 10/08/2023   CHOLHDL 2.3 10/08/2023    Medications Reviewed Today     Reviewed by Cecilie Coffee, RPH-CPP (Pharmacist) on 03/26/24 at 0830  Med List Status: <None>   Medication Order Taking? Sig Documenting  Provider Last Dose Status Informant  allopurinol  (ZYLOPRIM ) 300 MG tablet [id number]  TAKE 1 TABLET BY MOUTH EVERY DAY FOR GOUT PREVENTION Ether Hercules, MD  Active   amLODipine -valsartan  (EXFORGE ) 5-160 MG tablet [id number]  Take 1 tablet by mouth daily. Ether Hercules, MD  Active   atorvastatin  (LIPITOR) 40 MG tablet [id number]  Take 1 tablet (40 mg total) by mouth daily. Ether Hercules, MD  Active   empagliflozin  (JARDIANCE ) 10 MG TABS tablet [id number]  Take 1 tablet (10 mg total) by mouth daily before breakfast. Ether Hercules, MD  Active   Insulin  Glargine (BASAGLAR  [facility]) 100 UNIT/ML [id number]  Inject 32 Units into the skin daily. Ether Hercules, MD  Active   metFORMIN  (GLUCOPHAGE -XR) 500 MG 24 hr tablet [id number]  TAKE 2 TABLETS TWICE A DAY FOR DIABETES Ether Hercules, MD  Active   Semaglutide ,0.25 or 0.5MG /DOS, (OZEMPIC , 0.25 OR 0.5 MG/DOSE,) 2 MG/3ML SOPN [id number]  Inject 0.25 mg into the skin once a week. Ether Hercules, MD  Active               Assessment/Plan:  Medication Access:  - Screened patient for Medicaid. Income did not qualify.  - Screened for [employer] patient assistance program for Jardiance  but household income was above cut off. - called his health plan - [employer]. Patient has a high deductible plan. His deductible is $7500 / year. Representative could  not disclose to me the exact amount patient has left until he reaches his deductible but stated it was about half (so around $3800).  - Ozempic  cost is $926 - I have a coupon card that will lower dose about $100 per 30 days until patient meets deductible.   - There is a generic for Victoza  / liraglutide  that cost about $620 and then $265 with GoodRx Card.  - Checked to see if maybe GLP1/insulin  combos Xultophy or Soliqua  were covered but their cost was similar to Ozempic  - Xultophy $1100 anSoliqua  $830 / month - Jardiance  cost is $400 if he only gets 30  days. With discount card, cost would decreased by $175 per month - patient cost would be around $225/month. - Mailed discount cards for Ozempic  and Jardiance  to patient.  - Will forward information to PCP about med cost. If patient is not able to afford - could consider increasing Basaglar  dose for better blood glucose control.   Follow Up Plan: 4 weeks to see if he has any questions about discount cards or medications.   Cecilie Coffee, PharmD Clinical Pharmacist [facility]  Population Health [phone]

## 2024-04-10 ENCOUNTER — Telehealth: Payer: Self-pay | Admitting: Pharmacist

## 2024-04-10 ENCOUNTER — Other Ambulatory Visit: Payer: Self-pay | Admitting: Pharmacist

## 2024-04-10 NOTE — Telephone Encounter (Signed)
 Mailed patient discount cards for Jardiance  and Ozempic  last month. Called to see if he received but no answer. LM for patient on VM with CB# [phone].   It looks like he filled Jardiance  for 90 days 02/2024. He has not filled Ozempic  - likely due to cost.  At last check 2 weeks ago he was about half way to his deductible per [facility] so he likely has about $3800 left of the $7500 / year deductible.

## 2024-05-25 ENCOUNTER — Ambulatory Visit: Admitting: Student in an Organized Health Care Education/Training Program

## 2024-05-25 VITALS — BP 147/77 | HR 89 | Wt 244.0 lb

## 2024-05-25 DIAGNOSIS — E1122 Type 2 diabetes mellitus with diabetic chronic kidney disease: Secondary | ICD-10-CM | POA: Diagnosis not present

## 2024-05-25 DIAGNOSIS — I10 Essential (primary) hypertension: Secondary | ICD-10-CM

## 2024-05-25 DIAGNOSIS — E1169 Type 2 diabetes mellitus with other specified complication: Secondary | ICD-10-CM | POA: Diagnosis not present

## 2024-05-25 DIAGNOSIS — E119 Type 2 diabetes mellitus without complications: Secondary | ICD-10-CM | POA: Diagnosis not present

## 2024-05-25 DIAGNOSIS — Z794 Long term (current) use of insulin: Secondary | ICD-10-CM

## 2024-05-25 DIAGNOSIS — N182 Chronic kidney disease, stage 2 (mild): Secondary | ICD-10-CM

## 2024-05-25 DIAGNOSIS — E785 Hyperlipidemia, unspecified: Secondary | ICD-10-CM

## 2024-05-25 DIAGNOSIS — M7581 Other shoulder lesions, right shoulder: Secondary | ICD-10-CM | POA: Insufficient documentation

## 2024-05-25 LAB — MICROALBUMIN / CREATININE URINE RATIO
Creatinine,U: 69.2 mg/dL
Microalb Creat Ratio: 34.8 mg/g — ABNORMAL HIGH (ref 0.0–30.0)
Microalb, Ur: 2.4 mg/dL — ABNORMAL HIGH (ref 0.0–1.9)

## 2024-05-25 LAB — POCT GLYCOSYLATED HEMOGLOBIN (HGB A1C): Hemoglobin A1C: 7.9 % — AB (ref 4.0–5.6)

## 2024-05-25 MED ORDER — AMLODIPINE BESYLATE-VALSARTAN 5-320 MG PO TABS: 1.0000 | ORAL_TABLET | Freq: Every day | ORAL | 1 refills | Status: DC

## 2024-05-25 MED ORDER — PEN NEEDLES 32G X 5 MM MISC: 1 refills | Status: AC

## 2024-05-25 MED ORDER — PIOGLITAZONE HCL 15 MG PO TABS: 15.0000 mg | ORAL_TABLET | Freq: Every day | ORAL | 1 refills | Status: DC

## 2024-05-25 MED ORDER — BASAGLAR KWIKPEN 100 UNIT/ML ~~LOC~~ SOPN: 30.0000 [IU] | PEN_INJECTOR | Freq: Every day | SUBCUTANEOUS | 1 refills | Status: AC

## 2024-05-25 MED ORDER — ATORVASTATIN CALCIUM 40 MG PO TABS
40.0000 mg | ORAL_TABLET | Freq: Every day | ORAL | 3 refills | Status: AC
Start: 2024-05-25 — End: 2025-05-25

## 2024-05-25 NOTE — Assessment & Plan Note (Signed)
 Blood pressure controlled but could be improved. Discussed increasing Exforge  dose. - Increase dose of Amlodipine -valsartan  (Exforge ) to 5-320 mg daily. - Monitor blood pressure regularly.

## 2024-05-25 NOTE — Assessment & Plan Note (Signed)
 Chronic shoulder pain likely due to rotator cuff tendonitis. Pain management with ibuprofen discussed, steroid injection considered if pain worsens. - Continue ibuprofen as needed, limit to bad days. - Consider steroid injection if pain worsens or disrupts activities. - Encourage diabetes control to help with inflammation.

## 2024-05-25 NOTE — Progress Notes (Signed)
 Established Patient Office Visit  Subjective   Patient ID: Arthur Hudson, male    DOB: 1966/03/29  Age: 58 y.o. MRN: [id number]  Chief Complaint  Patient presents with   Medical Management of Chronic Issues    3 month follow up for Diabetes   Refill of phentermine  37.5mg  and needing needles for insulin .     HPI  Discussed the use of AI scribe software for clinical note transcription with the patient, who gave verbal consent to proceed.  History of Present Illness Arthur Hudson is a 58 year old male with diabetes and hypertension who presents for follow-up on his diabetes and blood pressure management.  Diabetes management has been challenging due to the high cost of medications. He uses Jardiance  10 mg daily, obtained through samples his wife collects from nursing homes, and Ozempic  sporadically, about one to two shots a month, depending on availability. He continues to take metformin , which is affordable, and Basaglar  insulin , which he has reduced from 32 units to 22 units due to low morning blood sugar readings. His morning blood sugar levels now range from 115 to 126 mg/dL. NovoLog is used occasionally, about once or twice a week, for significant blood sugar spikes. Continuous glucose monitoring with a Dexcom sensor has helped him understand the impact of his diet on glucose levels. His A1c has improved from 11.9% in April to 7.9% currently. He is concerned about medication affordability due to his high deductible insurance plan.  He is due for a refill of his blood pressure medication. He mentions that his cholesterol medication needs to be called in as he did not pick it up previously.  He experiences shoulder pain, particularly in the right shoulder, attributed to muscle tightness. 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 He manages the pain with ibuprofen, taking it every other day, especially on physically  demanding workdays. He reports that he was previously told an x-ray showed bone spurring and arthritis.  His family history includes a brother with diabetes and high blood pressure, who recently experienced episodes of passing out.      Objective:     BP (!) 147/77   Pulse 89   Wt 244 lb (110.7 kg)   SpO2 100%   BMI 32.19 kg/m    Physical Exam  Gen: Well-appearing man Heart: Regular, no murmur Lungs: Clear throughout, no crackles Ext: Bilateral shoulders have no effusions, no tenderness with passive range of motion.  He can abduct about 60 degrees in both shoulders.  He has normal strength throughout, no signs of rotator cuff tear.  He has discomfort with right shoulder external rotation and empty can abduction.  In both lower extremities he has no edema.    Assessment & Plan:   Problem List Items Addressed This Visit       High   Hypertension (Chronic)   Blood pressure controlled but could be improved. Discussed increasing Exforge  dose. - Increase dose of Amlodipine -valsartan  (Exforge ) to 5-320 mg daily. - Monitor blood pressure regularly.      Relevant Medications   atorvastatin  (LIPITOR) 40 MG tablet   amLODipine -valsartan  (EXFORGE ) 5-320 MG tablet   Diabetes mellitus without complication (HCC) - Primary (Chronic)   Glycemic control improved with A1c reduced to 7.9%. Cost constraints limit medication options. Pioglitazone  chosen for lower hypoglycemia risk. - Stop Jardiance  and Ozempic  due to cost. - Continue metformin  and insulin  glargine, increase insulin   glargine to 30 units daily. - Prescribed pioglitazone  15 mg oral daily.  No history of heart failure, no issues in the past with volume overload. - Monitor blood glucose levels closely for hypoglycemia.  I considered glipizide, but decided against it because of the risk of hypoglycemia when combined with Basaglar . - Schedule follow-up in one month to review Dexcom results and adjust treatment.      Relevant  Medications   atorvastatin  (LIPITOR) 40 MG tablet   pioglitazone  (ACTOS ) 15 MG tablet   Insulin  Pen Needle (PEN NEEDLES) 32G X 5 MM MISC   amLODipine -valsartan  (EXFORGE ) 5-320 MG tablet   Insulin  Glargine (BASAGLAR  KWIKPEN) 100 UNIT/ML   Other Relevant Orders   Microalbumin / creatinine urine ratio   POCT glycosylated hemoglobin (Hb A1C)     Medium    Hyperlipidemia associated with type 2 diabetes mellitus (HCC) (Chronic)   Requires atorvastatin  refill. - Refill atorvastatin  prescription.      Relevant Medications   atorvastatin  (LIPITOR) 40 MG tablet   pioglitazone  (ACTOS ) 15 MG tablet   amLODipine -valsartan  (EXFORGE ) 5-320 MG tablet   Insulin  Glargine (BASAGLAR  KWIKPEN) 100 UNIT/ML     Low   Rotator cuff tendinitis, right (Chronic)   Chronic shoulder pain likely due to rotator cuff tendonitis. Pain management with ibuprofen discussed, steroid injection considered if pain worsens. - Continue ibuprofen as needed, limit to bad days. - Consider steroid injection if pain worsens or disrupts activities. - Encourage diabetes control to help with inflammation.      Other Visit Diagnoses       Type 2 diabetes mellitus with stage 2 chronic kidney disease, with long-term current use of insulin  (HCC)       Relevant Medications   atorvastatin  (LIPITOR) 40 MG tablet   pioglitazone  (ACTOS ) 15 MG tablet   amLODipine -valsartan  (EXFORGE ) 5-320 MG tablet   Insulin  Glargine (BASAGLAR  KWIKPEN) 100 UNIT/ML       Return in about 4 weeks (around 06/22/2024) for hypertension management.    Cleatus Debby Specking, MD

## 2024-05-25 NOTE — Assessment & Plan Note (Signed)
 Requires atorvastatin  refill. - Refill atorvastatin  prescription.

## 2024-05-25 NOTE — Assessment & Plan Note (Signed)
 Glycemic control improved with A1c reduced to 7.9%. Cost constraints limit medication options. Pioglitazone  chosen for lower hypoglycemia risk. - Stop Jardiance  and Ozempic  due to cost. - Continue metformin  and insulin  glargine, increase insulin  glargine to 30 units daily. - Prescribed pioglitazone  15 mg oral daily.  No history of heart failure, no issues in the past with volume overload. - Monitor blood glucose levels closely for hypoglycemia.  I considered glipizide, but decided against it because of the risk of hypoglycemia when combined with Basaglar . - Schedule follow-up in one month to review Dexcom results and adjust treatment.

## 2024-05-25 NOTE — Patient Instructions (Signed)
  VISIT SUMMARY: Today, we reviewed your diabetes and blood pressure management, addressed your shoulder pain, and discussed your medication regimen. We also talked about your concerns regarding the cost of medications and made some adjustments to help manage this.  YOUR PLAN: -DIABETES MELLITUS TYPE 2, INSULIN  DEPENDENT: Your blood sugar control has improved, with your A1c now at 7.9%. Due to the high cost of some medications, we will stop Jardiance  and Ozempic . You should continue taking metformin  and insulin  glargine, increasing the insulin  glargine to 30 units daily. We are adding pioglitazone  15 mg daily to your regimen. Please monitor your blood sugar levels closely and we will review your Dexcom results in one month.  -HYPERTENSION: Your blood pressure is controlled but could be better. We are increasing your dose of Amlodipine -valsartan  (Exforge ) to 5-320 mg daily. Please monitor your blood pressure regularly.  -HYPERLIPIDEMIA: You need a refill of your cholesterol medication, atorvastatin . We will call in the prescription for you.  -ROTATOR CUFF TENDONITIS: Your shoulder pain is likely due to rotator cuff tendonitis, which is inflammation of the tendons in your shoulder. Continue taking ibuprofen as needed, especially on bad days. If the pain worsens or disrupts your activities, we may consider a steroid injection. Managing your diabetes will also help reduce inflammation.  -GOUT: You are on allopurinol  to prevent gout attacks. Continue taking it as prescribed.  -GENERAL HEALTH MAINTENANCE: We discussed that multivitamins are optional and can be discontinued to save costs. Continue taking magnesium supplements.  INSTRUCTIONS: Please follow up in one month to review your Dexcom results and adjust your diabetes treatment as needed. Monitor your blood pressure regularly and keep track of your blood sugar levels closely. If your shoulder pain worsens or disrupts your activities, contact us  to  discuss the possibility of a steroid injection.

## 2024-06-19 ENCOUNTER — Encounter: Payer: Self-pay | Admitting: Student in an Organized Health Care Education/Training Program

## 2024-06-19 ENCOUNTER — Ambulatory Visit: Payer: Self-pay | Admitting: Student in an Organized Health Care Education/Training Program

## 2024-06-19 ENCOUNTER — Ambulatory Visit: Admitting: Student in an Organized Health Care Education/Training Program

## 2024-06-19 VITALS — BP 139/77 | HR 77 | Ht 73.0 in | Wt 250.0 lb

## 2024-06-19 DIAGNOSIS — E119 Type 2 diabetes mellitus without complications: Secondary | ICD-10-CM

## 2024-06-19 DIAGNOSIS — Z794 Long term (current) use of insulin: Secondary | ICD-10-CM

## 2024-06-19 DIAGNOSIS — M7581 Other shoulder lesions, right shoulder: Secondary | ICD-10-CM | POA: Diagnosis not present

## 2024-06-19 DIAGNOSIS — Z7984 Long term (current) use of oral hypoglycemic drugs: Secondary | ICD-10-CM

## 2024-06-19 DIAGNOSIS — I10 Essential (primary) hypertension: Secondary | ICD-10-CM | POA: Diagnosis not present

## 2024-06-19 LAB — BASIC METABOLIC PANEL WITH GFR
BUN: 21 mg/dL (ref 6–23)
CO2: 25 meq/L (ref 19–32)
Calcium: 9.2 mg/dL (ref 8.4–10.5)
Chloride: 102 meq/L (ref 96–112)
Creatinine, Ser: 0.79 mg/dL (ref 0.40–1.50)
GFR: 97.83 mL/min (ref >60.00)
Glucose, Bld: 167 mg/dL — ABNORMAL HIGH (ref 70–99)
Potassium: 4.4 meq/L (ref 3.5–5.1)
Sodium: 137 meq/L (ref 135–145)

## 2024-06-19 MED ORDER — INSULIN ASPART 100 UNIT/ML IJ SOLN: INTRAMUSCULAR | 99 refills | Status: AC

## 2024-06-19 NOTE — Assessment & Plan Note (Signed)
 Chronic and stable.  Still intermittently bothering him after busy days at work with lots of lifting.  Using topical Voltaren multiple times a day.  I offered him physical therapy referral, must hold off for now.  May need to offer steroid injections in the future if the discomfort becomes functional limiting.

## 2024-06-19 NOTE — Assessment & Plan Note (Signed)
 Chronic and improving.  A1c is coming down nicely and we are doing better on a consistent regimen of diabetes medications.  Currently he is using long-acting insulin  30 units daily, metformin , and pioglitazone .  No adverse side effects.  I reviewed his freestyle CGM data, fasting blood sugars are at a good level.  He is having postprandial hyperglycemia through the day.  We talked about adding a NovoLog  sliding scale, I gave him instructions for that.  Follow-up with me in 2 months for a recheck of the A1c and to review the CGM again.  He has mild proteinuria, using valsartan  for renal protection.  Doing well with atorvastatin  for primary prevention of an ischemic event.

## 2024-06-19 NOTE — Progress Notes (Signed)
 Established Patient Office Visit  Subjective   Patient ID: Arthur Hudson, male    DOB: 31-Mar-1966  Age: 58 y.o. MRN: [id number]  Chief Complaint  Patient presents with   Hypertension    4 weeks Follow up. No concerns    HPI  Discussed the use of AI scribe software for clinical note transcription with the patient, who gave verbal consent to proceed.  History of Present Illness Arthur Hudson is a 58 year old male with hypertension and diabetes who presents for follow-up on his blood pressure and diabetes management.  His blood pressure has been fluctuating with the new medication regimen. He is currently taking Exforge , a combination medication for hypertension, at a dose of one tablet once a day. He had a previous visit to the emergency department due to low blood pressure but has continued with the Exforge . His blood pressure readings have varied, with a recent reading of 145/76, compared to previous readings of 118/60 and 128/72.  For diabetes management, he uses a continuous glucose monitor (CGM), specifically the [place]day, which he finds helpful despite occasional spikes in blood sugar levels after meals, particularly lunch. He is on a regimen of Lantus , a long-acting insulin , at 30 units per day, and metformin  at 500 mg, two tablets twice a day, with no reported side effects. He also takes pioglitazone , one tablet once a day. His A1c has improved from nearly 12% in April to 7.9% in July. He previously stopped Jardiance  and Ozempic  due to cost and insurance deductible issues. He occasionally uses Novolog , a rapid-acting insulin , on a sliding scale for blood sugar spikes over 210, typically once or twice a week.  He experiences shoulder pain, particularly after work activities involving lifting and moving boxes. He uses topical treatments like Voltaren and patches for relief. The pain sometimes disrupts his sleep, requiring him to change positions to alleviate discomfort. He notes  increased pain on days with heavy physical activity.  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 His colleagues are aware of his diabetes and help him manage his diet by reminding him of foods that may affect his blood sugar levels.      Objective:     BP 139/77 (BP Location: Right Arm, Patient Position: Sitting, Cuff Size: Large)   Pulse 77   Ht 6' 1 (1.854 m)   Wt 250 lb (113.4 kg)   BMI 32.98 kg/m   Physical Exam  Gen: Well-appearing man Neck: 2 cm mass in his upper neck on the right side is a chronic issue, unchanged Heart: Regular, no murmur Lungs: Unlabored, clear throughout Ext: Warm no edema     Assessment & Plan:   Problem List Items Addressed This Visit       High   Hypertension - Primary (Chronic)   Well-controlled.  Blood pressure slightly improved after we increased the valsartan  to full dose and continue with amlodipine .  No adverse side effects.  Will continue with Exforge  5-320.  Check BMP today.      Relevant Orders   Basic metabolic panel with GFR   Diabetes mellitus without complication (HCC) (Chronic)   Chronic and improving.  A1c is coming down nicely and we are doing better on a consistent regimen of diabetes medications.  Currently he is using long-acting insulin  30 units daily, metformin , and pioglitazone .  No adverse side effects.  I reviewed his freestyle CGM data, fasting blood sugars  are at a good level.  He is having postprandial hyperglycemia through the day.  We talked about adding a NovoLog  sliding scale, I gave him instructions for that.  Follow-up with me in 2 months for a recheck of the A1c and to review the CGM again.  He has mild proteinuria, using valsartan  for renal protection.  Doing well with atorvastatin  for primary prevention of an ischemic event.      Relevant Medications   insulin  aspart (NOVOLOG ) 100 UNIT/ML injection     Low   Rotator cuff tendinitis,  right (Chronic)   Chronic and stable.  Still intermittently bothering him after busy days at work with lots of lifting.  Using topical Voltaren multiple times a day.  I offered him physical therapy referral, must hold off for now.  May need to offer steroid injections in the future if the discomfort becomes functional limiting.       Return in about 2 months (around 08/19/2024).    Cleatus Debby Specking, MD

## 2024-06-19 NOTE — Patient Instructions (Signed)
  VISIT SUMMARY: Today, you had a follow-up appointment to review your blood pressure and diabetes management. We also discussed your shoulder pain and its impact on your daily activities.  YOUR PLAN: -TYPE 2 DIABETES MELLITUS: Type 2 diabetes is a condition where your body does not use insulin  properly, leading to high blood sugar levels. Your A1c has improved significantly from nearly 12% to 7.9%. You are currently taking Basaglar , metformin , and pioglitazone . We have prescribed a Novolog  pen for occasional high blood sugar levels, to be used on a sliding scale of 5-10 units based on your blood glucose levels. We will recheck your A1c in 2-3 months. If your insurance situation changes, we may consider reintroducing Jardiance  or Ozempic  next year.  -HYPERTENSION: Hypertension, or high blood pressure, is being managed with your current medication, Exforge . Your recent blood pressure reading was 145/76 mmHg. We increased the dose of valsartan  in Exforge  to 5-320 mg. We will recheck your blood pressure today and perform blood work due to the recent increase in your medication dose. Please follow up in 2-3 months.  -RIGHT ROTATOR CUFF TENDINITIS: Right rotator cuff tendinitis is inflammation of the tendons in your shoulder, causing pain, especially after physical activity. You are using topical treatments like Voltaren and patches for relief. If your symptoms persist or worsen, we may consider physical therapy or corticosteroid injections.  INSTRUCTIONS: Please follow up in 2-3 months for rechecking your A1c and blood pressure. Continue using your medications as prescribed and monitor your blood sugar levels with your Libre CGM. If your shoulder pain worsens, contact us  to discuss further treatment options.

## 2024-06-19 NOTE — Assessment & Plan Note (Signed)
 Well-controlled.  Blood pressure slightly improved after we increased the valsartan  to full dose and continue with amlodipine .  No adverse side effects.  Will continue with Exforge  5-320.  Check BMP today.

## 2024-08-19 ENCOUNTER — Encounter: Payer: Self-pay | Admitting: Student in an Organized Health Care Education/Training Program

## 2024-08-19 ENCOUNTER — Ambulatory Visit: Admitting: Student in an Organized Health Care Education/Training Program

## 2024-08-19 VITALS — BP 137/80 | HR 57 | Wt 248.0 lb

## 2024-08-19 DIAGNOSIS — I10 Essential (primary) hypertension: Secondary | ICD-10-CM

## 2024-08-19 DIAGNOSIS — E785 Hyperlipidemia, unspecified: Secondary | ICD-10-CM

## 2024-08-19 DIAGNOSIS — E1165 Type 2 diabetes mellitus with hyperglycemia: Secondary | ICD-10-CM

## 2024-08-19 DIAGNOSIS — E1169 Type 2 diabetes mellitus with other specified complication: Secondary | ICD-10-CM

## 2024-08-19 DIAGNOSIS — E66811 Obesity, class 1: Secondary | ICD-10-CM

## 2024-08-19 DIAGNOSIS — Z794 Long term (current) use of insulin: Secondary | ICD-10-CM

## 2024-08-19 DIAGNOSIS — E119 Type 2 diabetes mellitus without complications: Secondary | ICD-10-CM

## 2024-08-19 DIAGNOSIS — Z23 Encounter for immunization: Secondary | ICD-10-CM

## 2024-08-19 LAB — POCT GLYCOSYLATED HEMOGLOBIN (HGB A1C): Hemoglobin A1C: 7.4 % — AB (ref 4.0–5.6)

## 2024-08-19 NOTE — Assessment & Plan Note (Addendum)
 Type 2 diabetes mellitus is moderately-controlled with an A1c of 7.4% and no significant hypoglycemia.  CGM shows 80% time in range and 20% high.  We talked about the postprandial hyperglycemia he is experiencing especially after meals heavy in carbohydrate.  He reports difficulty adhering to evening metformin . Continue Lantus  30 units subcutaneously in the morning, Metformin  500 mg two tablets twice a day, and Pioglitazone  one tablet daily. Follow up in three months to reassess A1c and management.  We are limited on medication choices because of cost.  He is thinking about making a change to his insurance carrier, so potentially next year we might be able to access SGLT2 inhibitor and GLP-1 agonist.

## 2024-08-19 NOTE — Assessment & Plan Note (Signed)
 Hyperlipidemia is managed with atorvastatin , with a previous LDL of 41 mg/dL. Continue atorvastatin  as prescribed and reassess lipid levels at the next follow-up.

## 2024-08-19 NOTE — Assessment & Plan Note (Signed)
 Weight today 248 pounds with a BMI of 32.  Working on lifestyle modifications.  Talked about diet and exercise.  Unable to access GLP-1 agonist right now but hopefully in the future will be able to.

## 2024-08-19 NOTE — Assessment & Plan Note (Signed)
 Primary hypertension is well-controlled with a blood pressure of 137/80 mmHg. He is adherent to Exforge . Continue Exforge  one tablet daily and monitor blood pressure regularly.

## 2024-08-19 NOTE — Progress Notes (Signed)
 Established Patient Office Visit  Subjective   Patient ID: Arthur Hudson, male    DOB: 02-Jan-1966  Age: 58 y.o. MRN: [id number]  Chief Complaint  Patient presents with   Diabetes    2 month follow up     HPI  Discussed the use of AI scribe software for clinical note transcription with the patient, who gave verbal consent to proceed.  History of Present Illness Arthur Hudson is a 58 year old male with hypertension and diabetes who presents for follow-up.  He takes Exforge  once daily for blood pressure. He experiences leg swelling but no shortness of breath. He mows lawns with a push mower and has stopped going to the gym due to shoulder issues.  For diabetes management, he uses 30 units of long-acting insulin  in the morning, metformin  (two tablets twice a day, though he often forgets the evening dose), and Actos  (one tablet daily). He also takes atorvastatin  for cholesterol management. He monitors his blood glucose levels regularly, with morning readings ranging from 126 to 153 mg/dL. He uses a Dexcom device for continuous glucose monitoring, obtained from a relative due to insurance coverage issues. He notes occasional postprandial spikes in blood sugar, particularly after meals like pancakes or peach cobbler.  His weight has remained stable at 248 pounds, down from 250 pounds in July, despite efforts to lose more weight. 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 His wife has commented on his shortness.  His shoulder condition remains unchanged since the last visit.     Objective:     BP 137/80   Pulse (!) 57   Wt 248 lb (112.5 kg)   BMI 32.72 kg/m   Physical Exam  Gen: Well-appearing man Neck: Stable superficial lipoma on the anterior neck, normal thyroid , no other adenopathy or nodules Heart: Regular, no murmur Lungs: Unlabored, clear throughout, no crackles Ext: Warm, no edema  Results for orders placed or  performed in visit on 08/19/24  POCT glycosylated hemoglobin (Hb A1C)  Result Value Ref Range   Hemoglobin A1C 7.4 (A) 4.0 - 5.6 %   HbA1c POC (<> result, manual entry)     HbA1c, POC (prediabetic range)     HbA1c, POC (controlled diabetic range)        Assessment & Plan:    Problem List Items Addressed This Visit       High   Hypertension (Chronic)   Primary hypertension is well-controlled with a blood pressure of 137/80 mmHg. He is adherent to Exforge . Continue Exforge  one tablet daily and monitor blood pressure regularly.      Diabetes (HCC) - Primary (Chronic)   Type 2 diabetes mellitus is moderately-controlled with an A1c of 7.4% and no significant hypoglycemia.  CGM shows 80% time in range and 20% high.  We talked about the postprandial hyperglycemia he is experiencing especially after meals heavy in carbohydrate.  He reports difficulty adhering to evening metformin . Continue Lantus  30 units subcutaneously in the morning, Metformin  500 mg two tablets twice a day, and Pioglitazone  one tablet daily. Follow up in three months to reassess A1c and management.  We are limited on medication choices because of cost.  He is thinking about making a change to his insurance carrier, so potentially next year we might be able to access SGLT2 inhibitor and GLP-1 agonist.        Medium    Hyperlipidemia associated with type 2 diabetes mellitus (HCC) (  Chronic)   Hyperlipidemia is managed with atorvastatin , with a previous LDL of 41 mg/dL. Continue atorvastatin  as prescribed and reassess lipid levels at the next follow-up.      Obesity (BMI 30.0-34.9) (Chronic)   Weight today 248 pounds with a BMI of 32.  Working on lifestyle modifications.  Talked about diet and exercise.  Unable to access GLP-1 agonist right now but hopefully in the future will be able to.      Other Visit Diagnoses       Needs flu shot       Relevant Orders   Flu vaccine trivalent PF, 6mos and  older(Flulaval,Afluria,Fluarix,Fluzone)     Need for pneumococcal 20-valent conjugate vaccination       Relevant Orders   Pneumococcal conjugate vaccine 20-valent (Prevnar 20)        Cleatus Debby Specking, MD

## 2024-08-19 NOTE — Patient Instructions (Signed)
  VISIT SUMMARY: During your visit, we discussed your hypertension, diabetes, cholesterol, and shoulder condition. Your blood pressure and diabetes are well-controlled, and your cholesterol levels are being managed effectively. We also talked about your shoulder pain and its impact on your exercise routine.  YOUR PLAN: -TYPE 2 DIABETES MELLITUS: Type 2 diabetes mellitus is a condition where your body does not use insulin  properly, leading to high blood sugar levels. Your diabetes is well-controlled with an A1c of 7.4%. Continue taking Lantus  30 units in the morning, Metformin  500 mg two tablets twice a day, and Pioglitazone  one tablet daily. We will check your A1c today and follow up in three months to reassess your management.  -PRIMARY HYPERTENSION: Primary hypertension is high blood pressure without a known cause. Your blood pressure is well-controlled at 137/80 mmHg. Continue taking Exforge  one tablet daily and monitor your blood pressure regularly.  -HYPERLIPIDEMIA: Hyperlipidemia is having high levels of fats (lipids) in your blood, such as cholesterol. Your cholesterol is being managed with atorvastatin , and your previous LDL was 41 mg/dL. Continue taking atorvastatin  as prescribed, and we will reassess your lipid levels at your next follow-up.  -RIGHT ROTATOR CUFF TENDINOPATHY: Right rotator cuff tendinopathy is a condition where the tendons in your shoulder are irritated or damaged, causing pain and limiting movement. Your symptoms have not changed, and it continues to limit your exercise due to discomfort.  INSTRUCTIONS: Please follow up in three months to reassess your A1c and overall diabetes management. Continue monitoring your blood pressure regularly and take your medications as prescribed. We will reassess your lipid levels at your next follow-up. If your shoulder pain worsens or you have any concerns, please contact our office.

## 2024-11-25 ENCOUNTER — Ambulatory Visit: Admitting: Student in an Organized Health Care Education/Training Program

## 2024-11-27 ENCOUNTER — Other Ambulatory Visit: Payer: Self-pay | Admitting: Student in an Organized Health Care Education/Training Program

## 2024-11-27 DIAGNOSIS — E119 Type 2 diabetes mellitus without complications: Secondary | ICD-10-CM

## 2024-11-27 DIAGNOSIS — I10 Essential (primary) hypertension: Secondary | ICD-10-CM

## 2024-12-14 ENCOUNTER — Ambulatory Visit: Admitting: Student in an Organized Health Care Education/Training Program

## 2024-12-31 ENCOUNTER — Ambulatory Visit: Admitting: Student in an Organized Health Care Education/Training Program
